# Patient Record
Sex: Female | Born: 1935 | Race: White | Hispanic: No | Smoking: Never smoker
Health system: Southern US, Community
[De-identification: ages and names within clinical notes are randomized; demographics above are authoritative.]

## PROBLEM LIST (undated history)

## (undated) DIAGNOSIS — M8580 Other specified disorders of bone density and structure, unspecified site: Secondary | ICD-10-CM

## (undated) DIAGNOSIS — D131 Benign neoplasm of stomach: Secondary | ICD-10-CM

## (undated) DIAGNOSIS — K219 Gastro-esophageal reflux disease without esophagitis: Secondary | ICD-10-CM

## (undated) DIAGNOSIS — K5792 Diverticulitis of intestine, part unspecified, without perforation or abscess without bleeding: Secondary | ICD-10-CM

## (undated) DIAGNOSIS — J309 Allergic rhinitis, unspecified: Secondary | ICD-10-CM

## (undated) DIAGNOSIS — F419 Anxiety disorder, unspecified: Secondary | ICD-10-CM

## (undated) DIAGNOSIS — K222 Esophageal obstruction: Secondary | ICD-10-CM

## (undated) DIAGNOSIS — G4700 Insomnia, unspecified: Secondary | ICD-10-CM

## (undated) DIAGNOSIS — K529 Noninfective gastroenteritis and colitis, unspecified: Secondary | ICD-10-CM

## (undated) DIAGNOSIS — K760 Fatty (change of) liver, not elsewhere classified: Secondary | ICD-10-CM

## (undated) DIAGNOSIS — K589 Irritable bowel syndrome without diarrhea: Secondary | ICD-10-CM

## (undated) DIAGNOSIS — H269 Unspecified cataract: Secondary | ICD-10-CM

## (undated) DIAGNOSIS — K5790 Diverticulosis of intestine, part unspecified, without perforation or abscess without bleeding: Secondary | ICD-10-CM

## (undated) DIAGNOSIS — G5600 Carpal tunnel syndrome, unspecified upper limb: Secondary | ICD-10-CM

## (undated) DIAGNOSIS — I4891 Unspecified atrial fibrillation: Secondary | ICD-10-CM

## (undated) DIAGNOSIS — K648 Other hemorrhoids: Secondary | ICD-10-CM

## (undated) DIAGNOSIS — I10 Essential (primary) hypertension: Secondary | ICD-10-CM

## (undated) DIAGNOSIS — E785 Hyperlipidemia, unspecified: Secondary | ICD-10-CM

## (undated) DIAGNOSIS — R519 Headache, unspecified: Secondary | ICD-10-CM

## (undated) DIAGNOSIS — C44311 Basal cell carcinoma of skin of nose: Secondary | ICD-10-CM

## (undated) DIAGNOSIS — R51 Headache: Principal | ICD-10-CM

## (undated) HISTORY — DX: Headache, unspecified: R51.9

## (undated) HISTORY — DX: Fatty (change of) liver, not elsewhere classified: K76.0

## (undated) HISTORY — DX: Unspecified atrial fibrillation: I48.91

## (undated) HISTORY — DX: Anxiety disorder, unspecified: F41.9

## (undated) HISTORY — PX: CHOLECYSTECTOMY: SHX55

## (undated) HISTORY — DX: Esophageal obstruction: K22.2

## (undated) HISTORY — PX: CATARACT EXTRACTION, BILATERAL: SHX1313

## (undated) HISTORY — DX: Irritable bowel syndrome, unspecified: K58.9

## (undated) HISTORY — DX: Carpal tunnel syndrome, unspecified upper limb: G56.00

## (undated) HISTORY — DX: Other hemorrhoids: K64.8

## (undated) HISTORY — DX: Benign neoplasm of stomach: D13.1

## (undated) HISTORY — DX: Insomnia, unspecified: G47.00

## (undated) HISTORY — DX: Basal cell carcinoma of skin of nose: C44.311

## (undated) HISTORY — DX: Unspecified cataract: H26.9

## (undated) HISTORY — DX: Noninfective gastroenteritis and colitis, unspecified: K52.9

## (undated) HISTORY — DX: Headache: R51

## (undated) HISTORY — DX: Allergic rhinitis, unspecified: J30.9

## (undated) HISTORY — DX: Other specified disorders of bone density and structure, unspecified site: M85.80

## (undated) HISTORY — DX: Hyperlipidemia, unspecified: E78.5

---

## 1998-06-28 ENCOUNTER — Other Ambulatory Visit: Admission: RE | Admit: 1998-06-28 | Discharge: 1998-06-28 | Payer: Self-pay | Admitting: *Deleted

## 1999-05-30 ENCOUNTER — Other Ambulatory Visit: Admission: RE | Admit: 1999-05-30 | Discharge: 1999-05-30 | Payer: Self-pay | Admitting: *Deleted

## 2000-06-07 ENCOUNTER — Other Ambulatory Visit: Admission: RE | Admit: 2000-06-07 | Discharge: 2000-06-07 | Payer: Self-pay | Admitting: *Deleted

## 2000-07-09 ENCOUNTER — Encounter: Payer: Self-pay | Admitting: Gastroenterology

## 2001-06-07 ENCOUNTER — Other Ambulatory Visit: Admission: RE | Admit: 2001-06-07 | Discharge: 2001-06-07 | Payer: Self-pay | Admitting: *Deleted

## 2001-07-17 ENCOUNTER — Encounter: Payer: Self-pay | Admitting: Emergency Medicine

## 2001-07-18 ENCOUNTER — Inpatient Hospital Stay (HOSPITAL_COMMUNITY): Admission: EM | Admit: 2001-07-18 | Discharge: 2001-07-22 | Payer: Self-pay | Admitting: Emergency Medicine

## 2001-07-18 ENCOUNTER — Encounter: Payer: Self-pay | Admitting: Emergency Medicine

## 2002-06-12 ENCOUNTER — Other Ambulatory Visit: Admission: RE | Admit: 2002-06-12 | Discharge: 2002-06-12 | Payer: Self-pay | Admitting: *Deleted

## 2002-07-01 LAB — HM DEXA SCAN

## 2002-07-18 ENCOUNTER — Ambulatory Visit (HOSPITAL_COMMUNITY): Admission: RE | Admit: 2002-07-18 | Discharge: 2002-07-18 | Payer: Self-pay | Admitting: Internal Medicine

## 2002-07-18 ENCOUNTER — Encounter: Payer: Self-pay | Admitting: Internal Medicine

## 2002-07-30 ENCOUNTER — Encounter: Payer: Self-pay | Admitting: Gastroenterology

## 2003-06-16 ENCOUNTER — Other Ambulatory Visit: Admission: RE | Admit: 2003-06-16 | Discharge: 2003-06-16 | Payer: Self-pay | Admitting: Obstetrics and Gynecology

## 2005-07-03 ENCOUNTER — Other Ambulatory Visit: Admission: RE | Admit: 2005-07-03 | Discharge: 2005-07-03 | Payer: Self-pay | Admitting: Obstetrics & Gynecology

## 2006-08-27 ENCOUNTER — Ambulatory Visit (HOSPITAL_COMMUNITY): Admission: RE | Admit: 2006-08-27 | Discharge: 2006-08-27 | Payer: Self-pay | Admitting: Internal Medicine

## 2007-02-22 DIAGNOSIS — S92909A Unspecified fracture of unspecified foot, initial encounter for closed fracture: Secondary | ICD-10-CM | POA: Insufficient documentation

## 2007-02-22 DIAGNOSIS — Z9089 Acquired absence of other organs: Secondary | ICD-10-CM | POA: Insufficient documentation

## 2007-02-22 DIAGNOSIS — C4490 Unspecified malignant neoplasm of skin, unspecified: Secondary | ICD-10-CM | POA: Insufficient documentation

## 2007-02-22 DIAGNOSIS — E785 Hyperlipidemia, unspecified: Secondary | ICD-10-CM | POA: Insufficient documentation

## 2007-02-22 DIAGNOSIS — K219 Gastro-esophageal reflux disease without esophagitis: Secondary | ICD-10-CM | POA: Insufficient documentation

## 2007-11-18 ENCOUNTER — Other Ambulatory Visit: Admission: RE | Admit: 2007-11-18 | Discharge: 2007-11-18 | Payer: Self-pay | Admitting: Obstetrics and Gynecology

## 2007-12-10 ENCOUNTER — Encounter: Payer: Self-pay | Admitting: Gastroenterology

## 2007-12-31 ENCOUNTER — Ambulatory Visit: Payer: Self-pay | Admitting: Gastroenterology

## 2007-12-31 DIAGNOSIS — R195 Other fecal abnormalities: Secondary | ICD-10-CM | POA: Insufficient documentation

## 2007-12-31 DIAGNOSIS — K589 Irritable bowel syndrome without diarrhea: Secondary | ICD-10-CM | POA: Insufficient documentation

## 2008-01-27 ENCOUNTER — Ambulatory Visit: Payer: Self-pay | Admitting: Gastroenterology

## 2008-06-24 ENCOUNTER — Observation Stay (HOSPITAL_COMMUNITY): Admission: AD | Admit: 2008-06-24 | Discharge: 2008-06-26 | Payer: Self-pay | Admitting: Internal Medicine

## 2008-06-25 ENCOUNTER — Ambulatory Visit: Payer: Self-pay | Admitting: Gastroenterology

## 2008-06-26 ENCOUNTER — Encounter (INDEPENDENT_AMBULATORY_CARE_PROVIDER_SITE_OTHER): Payer: Self-pay | Admitting: Cardiology

## 2008-07-09 ENCOUNTER — Encounter: Payer: Self-pay | Admitting: Cardiology

## 2009-06-18 ENCOUNTER — Ambulatory Visit: Payer: Self-pay | Admitting: Vascular Surgery

## 2009-07-12 ENCOUNTER — Emergency Department (HOSPITAL_COMMUNITY): Admission: EM | Admit: 2009-07-12 | Discharge: 2009-07-12 | Payer: Self-pay | Admitting: Emergency Medicine

## 2010-04-25 LAB — PHOSPHORUS: Phosphorus: 4 mg/dL (ref 2.3–4.6)

## 2010-04-25 LAB — DIFFERENTIAL
Basophils Absolute: 0 10*3/uL (ref 0.0–0.1)
Lymphocytes Relative: 28 % (ref 12–46)
Neutro Abs: 3.5 10*3/uL (ref 1.7–7.7)

## 2010-04-25 LAB — CARDIAC PANEL(CRET KIN+CKTOT+MB+TROPI)
CK, MB: 1.1 ng/mL (ref 0.3–4.0)
Total CK: 60 U/L (ref 7–177)
Troponin I: 0.01 ng/mL (ref 0.00–0.06)
Troponin I: 0.01 ng/mL (ref 0.00–0.06)
Troponin I: 0.02 ng/mL (ref 0.00–0.06)

## 2010-04-25 LAB — COMPREHENSIVE METABOLIC PANEL
BUN: 6 mg/dL (ref 6–23)
CO2: 24 mEq/L (ref 19–32)
Chloride: 102 mEq/L (ref 96–112)
Creatinine, Ser: 0.71 mg/dL (ref 0.4–1.2)
GFR calc non Af Amer: >60 mL/min (ref >60)
Glucose, Bld: 91 mg/dL (ref 70–99)
Total Bilirubin: 0.7 mg/dL (ref 0.3–1.2)

## 2010-04-25 LAB — CBC
HCT: 40.4 % (ref 36.0–46.0)
Hemoglobin: 13.9 g/dL (ref 12.0–15.0)
MCV: 99.1 fL (ref 78.0–100.0)
RBC: 4.08 MIL/uL (ref 3.87–5.11)
WBC: 5.6 10*3/uL (ref 4.0–10.5)

## 2010-04-25 LAB — D-DIMER, QUANTITATIVE: D-Dimer, Quant: <0.22 ug/mL-FEU (ref 0.00–0.48)

## 2010-05-31 NOTE — H&P (Signed)
[name], [name]            ACCOUNT NO.:  [id number]   MEDICAL RECORD NO.:  [id number]          PATIENT TYPE:  INP   LOCATION:  2038                         FACILITY:  [facility]   PHYSICIAN:  Mark A. Perini, M.D.   DATE OF BIRTH:  04-05-1932   DATE OF ADMISSION:  06/24/2008  DATE OF DISCHARGE:                              HISTORY & PHYSICAL   CHIEF COMPLAINT:  Right-sided chest pain.   HISTORY OF PRESENT ILLNESS:  Echo is a 77 year old Caucasian female  with a past medical history as listed below.  She reports chronic acid  reflux problems and chronic nausea problems.  However, in the last week  or so, she has had a pain in the right side of her chest.  She has pain  in the central area substernally and also pain the radiates around to  her right shoulder blade and around the entire right side of her chest.  For the last 2 days, this has been severe and essentially constant.  She  had been unable to sleep because of it.  The pain does come and go to  some degree.  It is not definitively related to exertion and is not  definitively related to eating in any manner.  She denies any burning,  tingling or itching feelings with this.  She has had no rash.  There is  not a pleuritic component to the pain.  She has tried to double her  reflux medicine with no improvement.  She took Tylenol with no  improvement in symptoms.  There has been no fever and no blood from  above or below.  She denies any significant shortness of breath.  Due to  her symptoms of chest pain., it is felt that she should be admitted for  further evaluation.   PAST HISTORY:  1. Urticaria since 1960.  2. Cholecystectomy in 1994.  3. Left foot fracture in 2000.  4. Postmenopausal since age 45.  5. Gastroesophageal reflux.  6. Neurodermatitis.  7. Hyperlipidemia and hypertriglyceridemia.  8. Diverticulosis.  9. Internal hemorrhoids.  10.Carpal tunnel syndrome diagnosed bilaterally, left greater than      right  in 2002.  11.Allergic rhinitis.  12.Admitted for diverticulitis in 2003.  13.Osteopenia.  14.Irritable bowel syndrome.  15.Anxiety.  16.She has many drug intolerances which is made it almost impossible      treat her cholesterol and she is intolerant of most medicines that      we attempt to give her.   ALLERGIES AND DRUG INTOLERANCES:  Include propylene glycol and any  medicine that has propylene glycol in it, penicillin, sulfa, Anaprox,  Levaquin, erythromycin, Ceclor, Tavist, Allegra, Sudafed, Kenalog,  Marcaine, Cipro, Nexium. Aspirin hurts her stomach.  She is intolerant  of Levsin, Zelnorm, niacin, Policosanol, Bentyl, and Lexapro.   CURRENT MEDICATIONS:  Caltrate, multivitamin, Bentyl, Prilosec twice  daily, fish oil six a day, Zetia daily, vitamin D 50,000 units weekly,  Fiorinal as needed, saline nasal spray, and over-the-counter Claritin as  needed.   SOCIAL HISTORY:  She is married.  Her husband, [name] is with her  currently.  She has  been married since 1957.  She is two daughters and  two grandchildren.  She had 1 year of college education.  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  No tobacco history.  No alcohol  or drugs.   FAMILY HISTORY:  Father died at age 61 of leukemia.  Mother died at age  27 of possible MI or stroke and she had diabetes.  One brother died of  coronary disease.  Other siblings have died of pancreatic cancer and  mesothelioma and she has one living sibling.  Her two children are in  good health.   REVIEW OF SYSTEMS:  As per the history of present illness.   PHYSICAL EXAMINATION:  Temperature 97.8, weight 132 pounds, blood  pressure 152/98, pulse 86, 98% saturation on room air.  She is in no acute distress.  There is no rash noted on her breast or right chest or her back to  suggest shingles.  LUNGS:  Clear to auscultation bilaterally with no wheezes, rales or  rhonchi.  HEART:  Regular rate and rhythm with no murmur, rub or  gallop.  There is  no tenderness.  There is no edema.  ABDOMEN:  Soft, nontender, and nondistended with no mass or  hepatosplenomegaly.   Laboratory data pending at this time.  An EKG done in the office shows  normal sinus rhythm with an incomplete right bundle branch block.  She  does have a PVC.  There is a leftward axis.  There are normal intervals.  There is some artifact which makes is somewhat difficult to interpret,  however, there are no definite ST or T-wave changes and there is no  significant change compared to an EKG done in December 2009 and there is  no hypertrophy.   ASSESSMENT/PLAN:  A 77 year old female with right-sided chest pain.  We  will admit.  We will check a G D-dimer, as well as cardiac enzymes and a  BNP. We will continue her acid reflux treatment.  We will use  hydrocodone or Tylenol for pain management.  We will place her on Plavix  as she does not tolerate aspirin and we will put her on DVT prophylactic  dose Lovenox.  Depending on the results of her D-dimer and other lab  tests, we may consider a CT scan of the chest or possibly even a CT  angiogram of the chest and we will also consider a cardiology  consultation.  Her risk factors for underlying coronary disease include  some family history, age, and essentially untreated cholesterol problems  but fortunately she has not had other significant cardiac risk factors  to this date.      Mark A. Perini, M.D.  Electronically Signed     MAP/MEDQ  D:  06/24/2008  T:  06/24/2008  Job:  [id number]

## 2010-05-31 NOTE — Procedures (Signed)
CAROTID DUPLEX EXAM   INDICATION:  Plaque in retinas.   HISTORY:  Diabetes:  No.  Cardiac:  No.  Hypertension:  No.  Smoking:  No.  Previous Surgery:  No.  CV History:  Asymptomatic.  Amaurosis Fugax No, Paresthesias No, Hemiparesis No                                       RIGHT             LEFT  Brachial systolic pressure:         120               128  Brachial Doppler waveforms:         Normal            Normal  Vertebral direction of flow:        Antegrade         Antegrade  DUPLEX VELOCITIES (cm/sec)  CCA peak systolic                   74                75  ECA peak systolic                   59                75  ICA peak systolic                   74                61  ICA end diastolic                   22                24  PLAQUE MORPHOLOGY:                  Soft              Soft  PLAQUE AMOUNT:                      Minimal           Minimal  PLAQUE LOCATION:                    ICA               ICA   IMPRESSION:  1. Doppler velocities suggest a 1%-39% stenosis in the bilateral      internal carotid arteries.  2. Antegrade flow in the vertebral arteries.       ___________________________________________  Larina Earthly, M.D.   NT/MEDQ  D:  06/18/2009  T:  06/18/2009  Job:  [id number]   cc:   Loraine Leriche A. Perini, M.D.

## 2010-05-31 NOTE — Consult Note (Signed)
[name] [name]            ACCOUNT NO.:  [id number]   MEDICAL RECORD NO.:  [id number]          PATIENT TYPE:  INP   LOCATION:  2038                         FACILITY:  [facility]   PHYSICIAN:  Georga Hacking, M.D.DATE OF BIRTH:  26-May-1932   DATE OF CONSULTATION:  06/25/2008  DATE OF DISCHARGE:                                 CONSULTATION   REASON FOR CONSULTATION:  Thank you for asking me to see this very nice  77 year old female for evaluation of chest pain.  She comes from a  family with a strong history of ischemic heart disease, and I have seen  her several times in the year for stress testing and at one point she  even had a catheterization, although I have not seen her in over 10  years, so do not immediately have that report.  She did not have much  coronary artery disease noted previously.  She has chronic GI problems  with nausea and chronic reflux.  She has had about a five-week history  of chest discomfort.  She describes central chest pain, suggestive of  reflux, but in the past week developed right-sided chest pain that would  radiate through to her back and  around to the entire right side of her  chest, which then became constant and severe, and was so bad that she  could not sleep.  It was not related to exertion.  She went to the  [employer] exercise class up until one week ago, when she stopped because of  the pain.  She has not had any recent rash or pleuritic component to her  pain.  She has not had fever or other symptoms.  The pain does not  radiate to the left arm, and is described as a sharp and lancinating-  type pain.  It has been better since she has been in the hospital.   PAST MEDICAL HISTORY:  1. Urticaria since 1960.  2. She has a history of neurodermatitis.  3. Hyperlipidemia.  4. Hypertriglyceridemia.  5. Intolerance to number of statin drugs.  6. A previous admission for diverticulosis.  7. Hemorrhoids.  8. Osteopenia.  9. Irritable bowel  syndrome.  10.Anxiety.  11.She also has known carpal tunnel syndrome.   PAST SURGICAL HISTORY:  Cholecystectomy and foot fracture.   ALLERGIES/INTOLERANCES:  She is intolerant of LEVSIN, ZELNORM, NIACIN,  POLICOSANOL, BENTYL, LEXAPRO.  She claims allergies to any medicine  including PROPYLENE GLYCOL, PENICILLIN, SULFA, ANAPROX,  LEVAQUIN,  ERYTHROMYCIN, CECLOR, TAVIST, ALLEGRA, SUDAFED, KENALOG, MARCAINE,  CIPRO, NEXIUM, and says that ASPIRIN hurts her stomach.   CURRENT MEDICATIONS:  1. Caltrate.  2. Bentyl.  3. Prilosec 20 mg b.i.d.  4. Fish oil twice daily.  5. Zetia 10 mg daily.  6. Vitamin D 50,000 units weekly.  7. Fiorinal as needed.   SOCIAL HISTORY:  Is married for 53 years.  She has two daughters and two  grandchildren.  She is a Futures trader.  She is a nonsmoker.  Does not use  alcohol to excess.   FAMILY HISTORY:  Father died at age 109, of leukemia.  Mother died at age  85, of a stroke.  A brother died of coronary artery disease.  Another  brother had coronary artery disease, but died of pancreatic cancer.  Another brother mesothelioma, but also had a history of coronary artery  disease.   REVIEW OF SYSTEMS:  Her weight has been stable.  She claims significant  anxiety.  She has no eye, ear, nose or throat problems.  She does not  have any hearing problems.  She complains of occasional mild dysphasia.  She has irritable bowel syndrome and has occasional diarrhea and  constipation.  She has some urinary frequency.  She complains of  arthritis involving her knee.  She has not had syncope but has frequent  headaches.  She has no history of stroke or TIA.  Other than as noted  above, the remainder of the review of systems is unremarkable.   PHYSICAL EXAMINATION:  GENERAL:  She is a pleasant elderly female,  appearing her stated age.  VITAL SIGNS:  Blood pressure is 117/66, pulse is 78 and regular, oxygen  saturations were normal.  SKIN:  Skin is warm and dry.  HEENT:   EOMI.  Pupils equal, round, reactive to light and accommodation.  Cornea and sclerae clear. Fundi not examined.  Pharynx negative.  NECK:  Supple without masses, JVD, thyromegaly or bruits.  LUNGS:  Clear to auscultation and percussion.  CARDIOVASCULAR:  Exam normal S1 and S2.  No S3, S4 or murmur.  ABDOMEN:  Abdomen is soft and nontender.  Femoral distal pulses are 2+.  No edema.  No varicosities noted.  NEUROLOGIC:  Normal.   DATA:  A 12-lead EKG is normal.   Previous abdominal ultrasound shows a previous cholecystectomy.  Chest x-  ray is normal.   LABORATORY DATA:  Shows a normal CBC, D-dimer, chemistry panel.  Three  sets of cardiac enzymes were normal.  A beta natriuretic peptide is 33.   EKG is normal x2.   IMPRESSION:  1. Constant chest pain, predominantly right-sided, which is atypical      for myocardial ischemia.  The pain really does not sound like      cardiac-type pain or even pulmonary pain.  The pain would be more      suggestive of a neuropathic musculoskeletal etiology.  The things      that come to mind would be shingles, some sort of a disk in the      back or collapsed vertebra, musculoskeletal pain or possibly      radiated pain from the esophagus  2. Multiple drug allergies.  3. A recent esophageal stricture noted.  4. Chronic headaches.  5. Neurodermatitis  6. History of diverticulosis.  7. History of carpal tunnel syndrome.  8. Hyperlipidemia, with intolerance to several statin drugs.   RECOMMENDATIONS:  Her pain really does not sound cardiac to me, and she  did not particularly think it was either.  I would suggest getting an  echocardiogram and pursuing the chest CT and other workup.  I would  wonder about shingles and would also suggest a thoracic spine film or  possibly even an MRI or CT scan of her back area if the pain persists.  If no other etiology can be found for the pain, despite treatment, we  could  consider a Stress Test Center, although  the pain is really  atypical for myocardial ischemia.   Thank you for asking me to see her with you.      Georga Hacking, M.D.  Electronically  Signed     WST/MEDQ  D:  06/25/2008  T:  06/25/2008  Job:  [id number]

## 2010-06-03 NOTE — Discharge Summary (Signed)
[place]  Patient:    [name], [name] Visit Number: [id number] MRN: [id number]          Service Type: MED Location: 5000 [id number] Attending Physician:  Cherylynn Ridges Dictated by:   Jimmye Norman, M.D. Admit Date:  07/17/2001 Discharge Date: 07/22/2001                             Discharge Summary  DISCHARGE DIAGNOSIS:  Acute diverticulitis.  PRINCIPAL PROCEDURE:  Admission and IV antibiotics, IV hydration, and pain control.  DISCHARGE MEDICATIONS: 1. She was discharged to home with ciprofloxacin 500 mg p.o. b.i.d. 2. Flagyl one tablet p.o. t.i.d.  DIET:  As tolerated.  CONDITION:  Stable.  DISCHARGE INSTRUCTIONS: 1. She was to follow up with me on July 30, 2001. 2. She was to be advanced to a high fiber, low residue diet, soft diet.  BRIEF SUMMARY OF HOSPITAL COURSE:  The patient was admitted by me with abdominal pain, mildly elevated white count with a left shift, and abdominal pain localizing mostly to the left lower quadrant with rebound and guarding. She had a CT scan which demonstrated some pericolonic inflammation with the possibility of microperforation.  The patient never progressed in her symptoms and her disease.  She was treated with IV antibiotics including Primaxin and p.o. proton pump inhibitor, and Protonix.  She progressed well after several days in the hospital and she improved to where she was tolerating a regular diet and had minimal pain and was discharged to home.  She was to follow up with me on July 15. Dictated by:   Jimmye Norman, M.D. Attending Physician:  Cherylynn Ridges DD:  08/01/01 TD:  08/05/01 Job: [id number]

## 2010-06-03 NOTE — Discharge Summary (Signed)
[name], [name]            ACCOUNT NO.:  [id number]   MEDICAL RECORD NO.:  [id number]          PATIENT TYPE:  OBV   LOCATION:  2038                         FACILITY:  [facility]   PHYSICIAN:  Mark A. Perini, M.D.   DATE OF BIRTH:  06-Feb-1932   DATE OF ADMISSION:  06/24/2008  DATE OF DISCHARGE:  06/26/2008                               DISCHARGE SUMMARY   DISCHARGE DIAGNOSES:  1. Right-sided chest pain felt to be noncardiac in nature.  2. Significant acid reflux.  3. Stricture noted at the gastroesophageal junction.  4. Hyperlipidemia.  5. Neurodermatitis.  6. Allergic rhinitis.  7. Multiple drug intolerances.  8. Anxiety and irritable bowel syndrome.  9. Mildly reduced systolic function noted on echocardiogram this      admission.   PROCEDURES:  1. Gastroenterology consultation.  2. Cardiology consultation.   PROCEDURES:  1. Upper endoscopy performed on June 25, 2008, which showed stricture      at the gastroesophageal junction, otherwise normal examination.  2. 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  No acute abdominal process.  Small amount of      gas within the esophagus could represent dysmotility.  3. Lumbar spine x-ray on June 25, 2008 showed normal alignment with no      acute bony findings.  4. Thoracic x-ray on June 25, 2008, showed normal alignment with no      acute bony findings.  5. Chest x-ray on June 24, 2008, showed no active disease.   DISCHARGE MEDICATIONS:  1. She is to resume Caltrate and multivitamin as before.  2. She is to resume her fish oil as before.  3. She is to use Bentyl as needed.  4. She may take Zetia 10 mg daily.  5. Vitamin D 50,000 units weekly.  6. Fiorinal as needed  for headache.  7. Saline nasal spray as needed.  8. Over-the-counter Claritin 10 mg daily as needed.   She is to discontinue Prilosec and start Zegerid 40 mg twice daily and  she may take Tums for breakthrough pain.   HISTORY OF PRESENT ILLNESS:  [name] is a pleasant 77 year old female who  presented to the office with 1 week pain in the right side of her chest.  The pain was central and substernal, but also radiated around to her  right shoulder blade and around the entire right side of her chest.  For  the last 2 days, it had been severe and essentially constant.  She was  unable to sleep.  The pain did come and go to some degree, but was  mostly persistent.  The pain did not relate to exertion and did not  relate eating.  She denied any neuropathic symptoms such as burning  tingling or itching.  She denied any rash  that will be consistent with  shingles.  There was no pleuritic component.  She did try to double her  reflux medicine with no improvement.  She took Tylenol with no  improvement.  There was no fever noted.  Due to these symptoms, she was  admitted for further evaluation.   HOSPITAL COURSE:  [name] was admitted to a telemetry bed.  She remained  stable from a cardiovascular and pulmonary standpoint during her stay.  Cardiology and gastroenterology were consulted.  She did also undergo an  echocardiogram performed on June 26, 2008.  This showed systolic  function mildly reduced with an ejection fraction of 45-50% with normal  wall motion with no regional wall motion abnormalities.  She had mild  mitral regurgitation.  She underwent EGD and her antireflux medicine was  changed.  She never developed a shingles rash.  There was no clear  evidence if this was musculoskeletal pain or pain radiating from a  spinal degenerative process.  On June 26, 2008, her workup was complete  and she was felt stable for discharge home.   DISCHARGE PHYSICAL EXAMINATION:  VITAL SIGNS:   Temperature 97.9,  afebrile; pulse 87; respiratory rate 18; 95% saturation on room air; and  blood pressure 113/67.  GENERAL:  She is in no acute distress.  LUNGS:  Clear to auscultation bilaterally.  HEART:  Regular rate and rhythm with no murmur.  ABDOMEN:  Soft and nontender.  There was no rash.   DISCHARGE LABORATORY DATA:  Several sets of cardiac enzymes were normal;  and admission CMP and CBC were unremarkable as well as a D-dimer, which  was normal on June 24, 2008; and a BNP, which was 4 on June 24, 2008.   DISCHARGE INSTRUCTIONS:  [name] is to follow a heart-healthy diet.  She  is to increase her activity slowly.  She is to call if she has any  recurrent problems.  She is to follow up with Dr. Waynard Edwards in 3-4 weeks at  a previously scheduled visit and she is to call Dr. Donnie Aho for a follow  up in the office and she may undergo a follow up stress test.      Loraine Leriche A. Perini, M.D.  Electronically Signed     MAP/MEDQ  D:  07/15/2008  T:  07/16/2008  Job:  [id number]   cc:   Georga Hacking, M.D.

## 2010-06-03 NOTE — H&P (Signed)
[place]. [facility]  Patient:    [name], [name] Visit Number: [id number] MRN: [id number]          Service Type: MED Location: 5000 [id number] Attending Physician:  Cherylynn Ridges Dictated by:   Jimmye Norman, M.D. Admit Date:  07/17/2001 Discharge Date: 07/22/2001   CC:         Jeannett Senior A. Evlyn Kanner, M.D.   History and Physical  CHIEF COMPLAINT:  The patient is a 77 year old woman admitted with lower abdominal pain and probably acute diverticulitis.  HISTORY OF PRESENT ILLNESS:  The patient has been in our emergency room since approximately 7 oclock this evening.  She developed severe abdominal pain approximately two to three hours earlier, after eating a meal, which had not abated with ambulation.  She has had a history of recent constipation and did take a suppository a couple of days ago along with Dulcolax pills several days ago with some diarrhea on Tuesday but had significant pain yesterday.  She came to the emergency room and her pain did not abate.  She had no previous history of diverticulitis.  PAST MEDICAL HISTORY:  Significant for sinusitis, sinus disease, gastric reflux, and acid reflux.  PAST SURGICAL HISTORY:  The only surgery she has had before was a laparoscopic cholecystectomy.  She has not had a hysterectomy.  MEDICATIONS:  Aciphex and Caltrate.  She also was taking a hormonal supplement up to a week ago.  She cannot remember the name.  ALLERGIES:  The patient has sensitivity to a number of medications, but the one she can document are PENICILLIN, SINEQUAN, ANAPROX.  She has GI intolerance to ERYTHROMYCIN.  She also does not tolerate SUDAFED and reports a possible allergy to LOCAL ANESTHETIC.  She also has POLYSPORIN sensitivity.  REVIEW OF SYSTEMS:  She had not had a bowel movement for approximately a week up until Tuesday.  PHYSICAL EXAMINATION:  VITAL SIGNS:  She currently has a temperature of 99.4.  Her other vital  signs are stable.  HEENT:  She is normocephalic, atraumatic, and anicteric.  NECK:  Supple.  CHEST:  Clear.  HEART:  Regular rate and rhythm.  No murmurs, rubs, gallops, or heaves.  She occasionally has been tachycardic.  ABDOMEN:  Distended, particularly in the lower abdomen with hypoactive bowel sounds.  She has localized rebound and guarding in the lower abdomen.  RECTAL:  Unremarkable.  LABORATORY DATA:  White count of 10.3 thousand with a left shift.  Her BMET is normal.  X-rays demonstrate diverticulitis in the mid to distal sigmoid colon with some free fluid in the pelvis, possibly secondary to reaction to the inflammation and possibly representing perforation.  PLAN:  The plan is to admit her and place her on IV antibiotics and Primaxin primarily.  Pain control and IV hydration.  We will reassess her within 24 hours, and if her pain is worsened or not showing significant improvement, then she may be a candidate for surgical aspiration. Dictated by:   Jimmye Norman, M.D. Attending Physician:  Cherylynn Ridges DD:  07/18/01 TD:  07/21/01 Job: [id number]

## 2010-07-14 IMAGING — CR DG CHEST 1V PORT
1 series · 1 of 1 positions shown · non-contrast
Comparison: None

CLINICAL DATA: Right-sided chest pain.

PORTABLE CHEST - 1 VIEW

[AP]
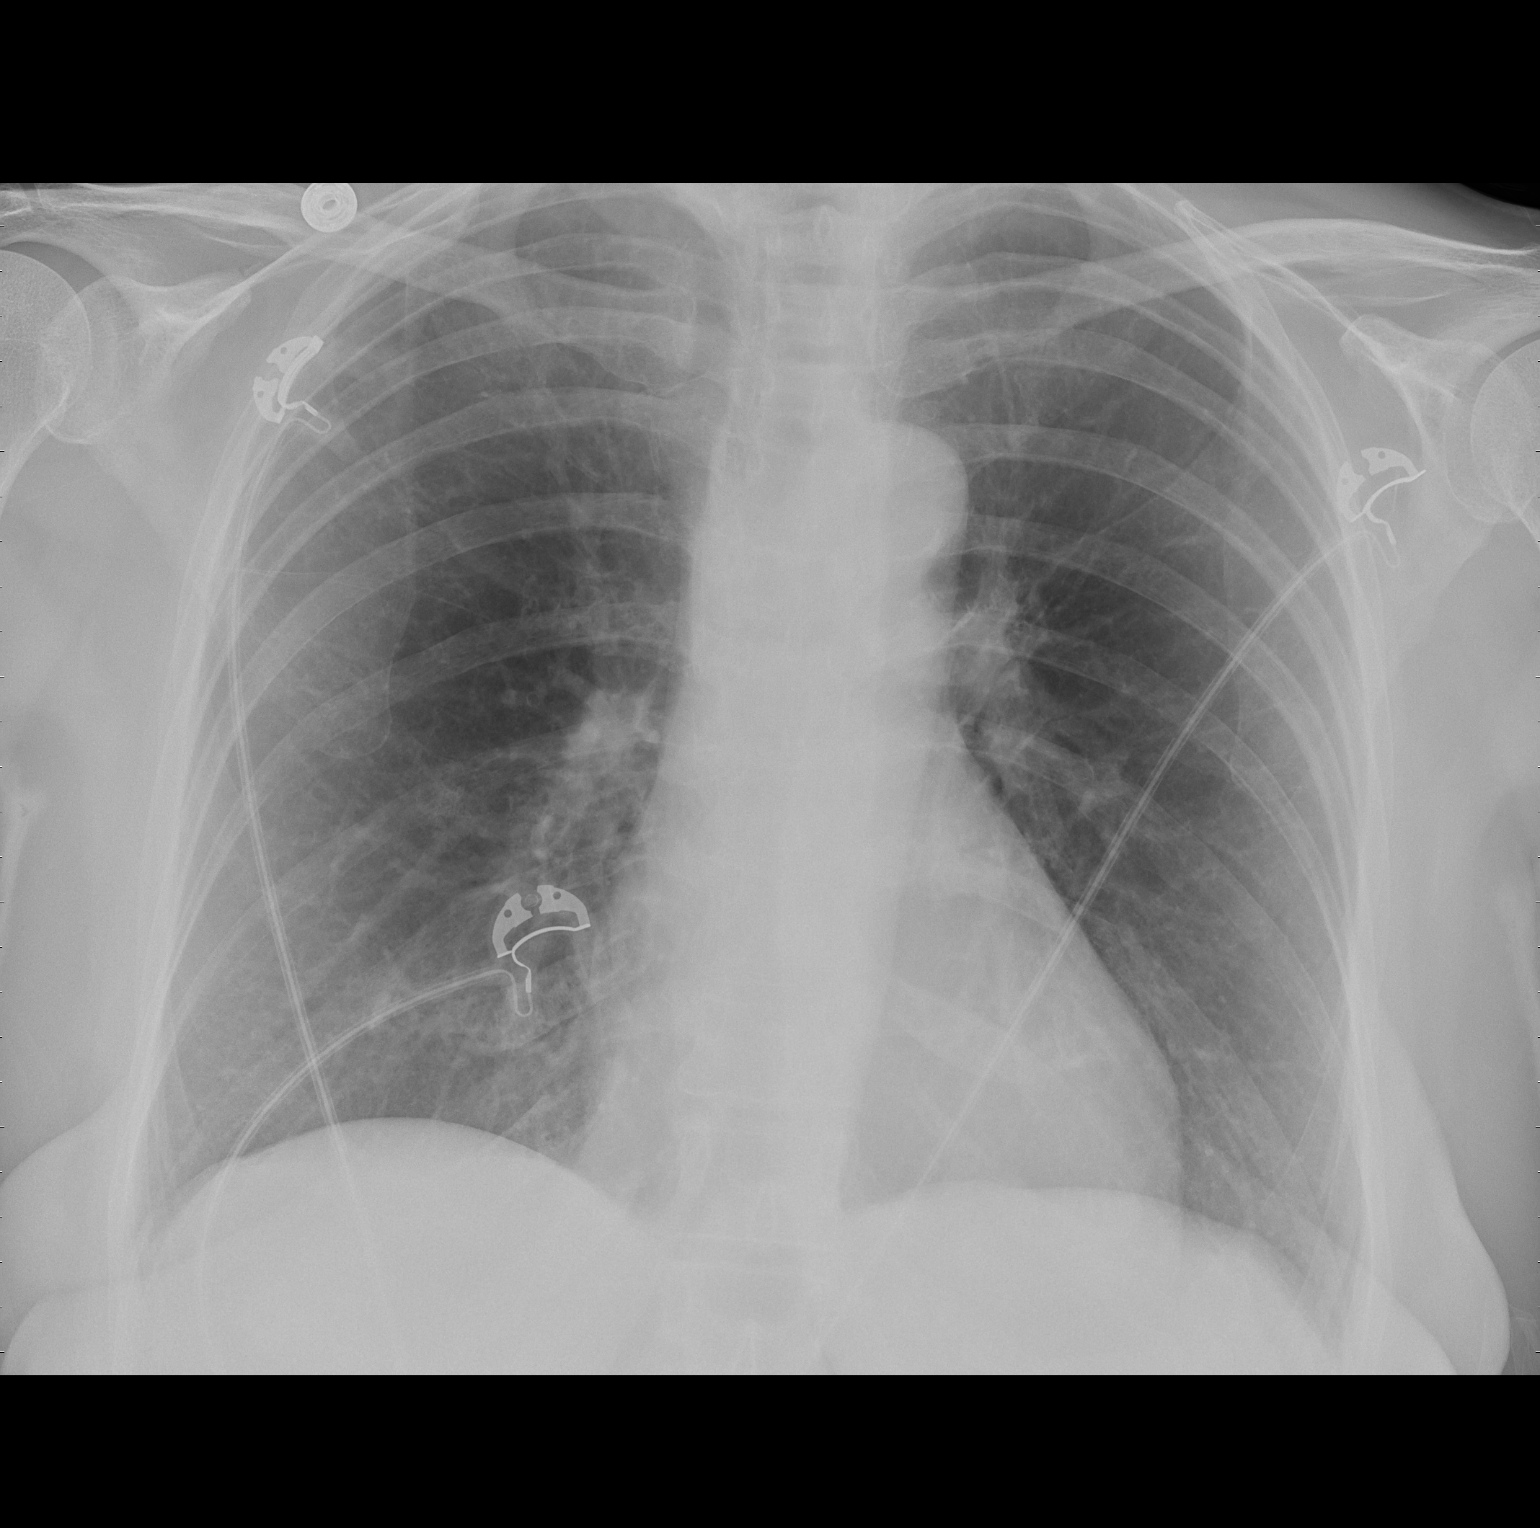

[1 of 1 positions shown; findings below may reference images not displayed]

FINDINGS: Heart and mediastinal contours are within normal limits.
No focal opacities or effusions.  No acute bony abnormality.
IMPRESSION: No active disease.

## 2010-07-15 IMAGING — CR DG THORACIC SPINE 2V
3 series · 3 of 3 positions shown · non-contrast
Comparison: None

CLINICAL DATA: Pain.

THORACIC SPINE - 2 VIEW

[t t-spine a.p.]
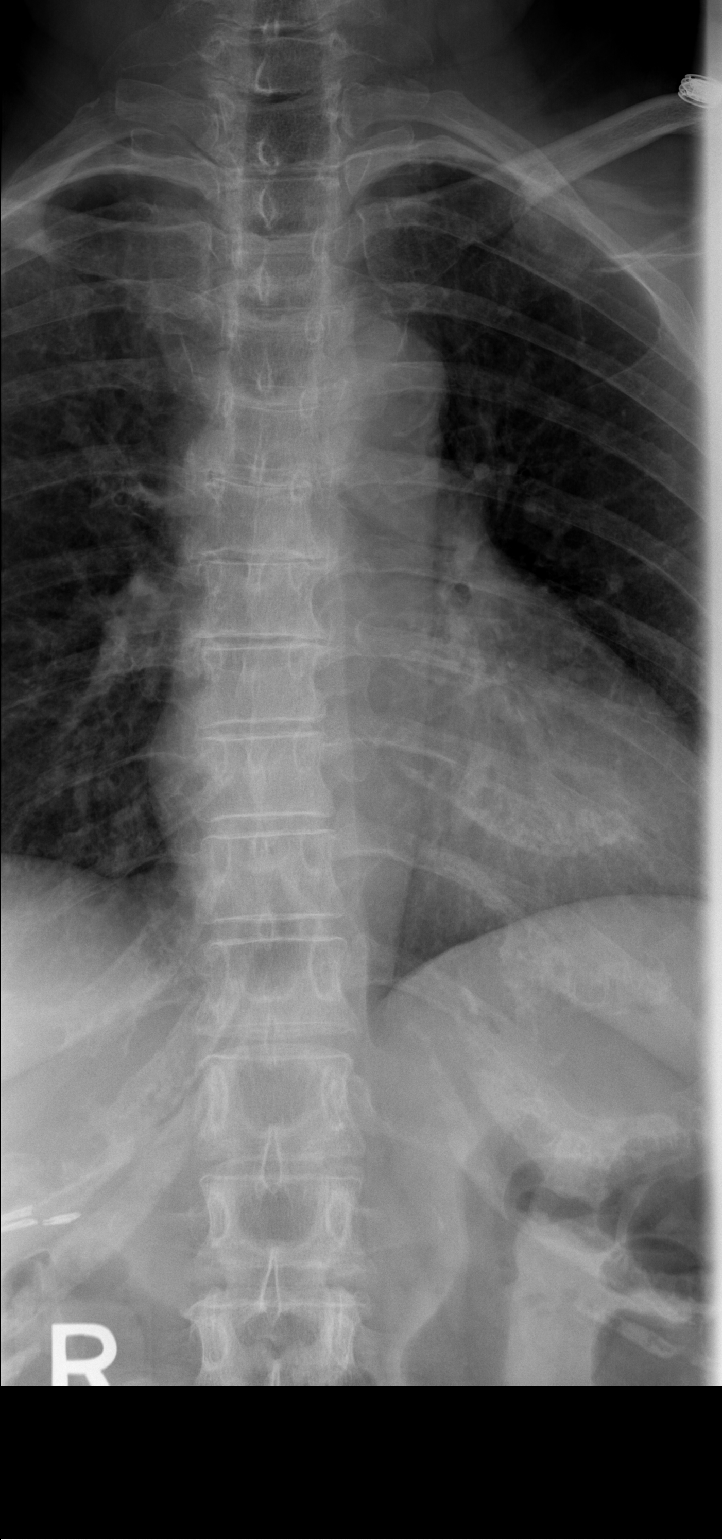

[t t-spine lat *]
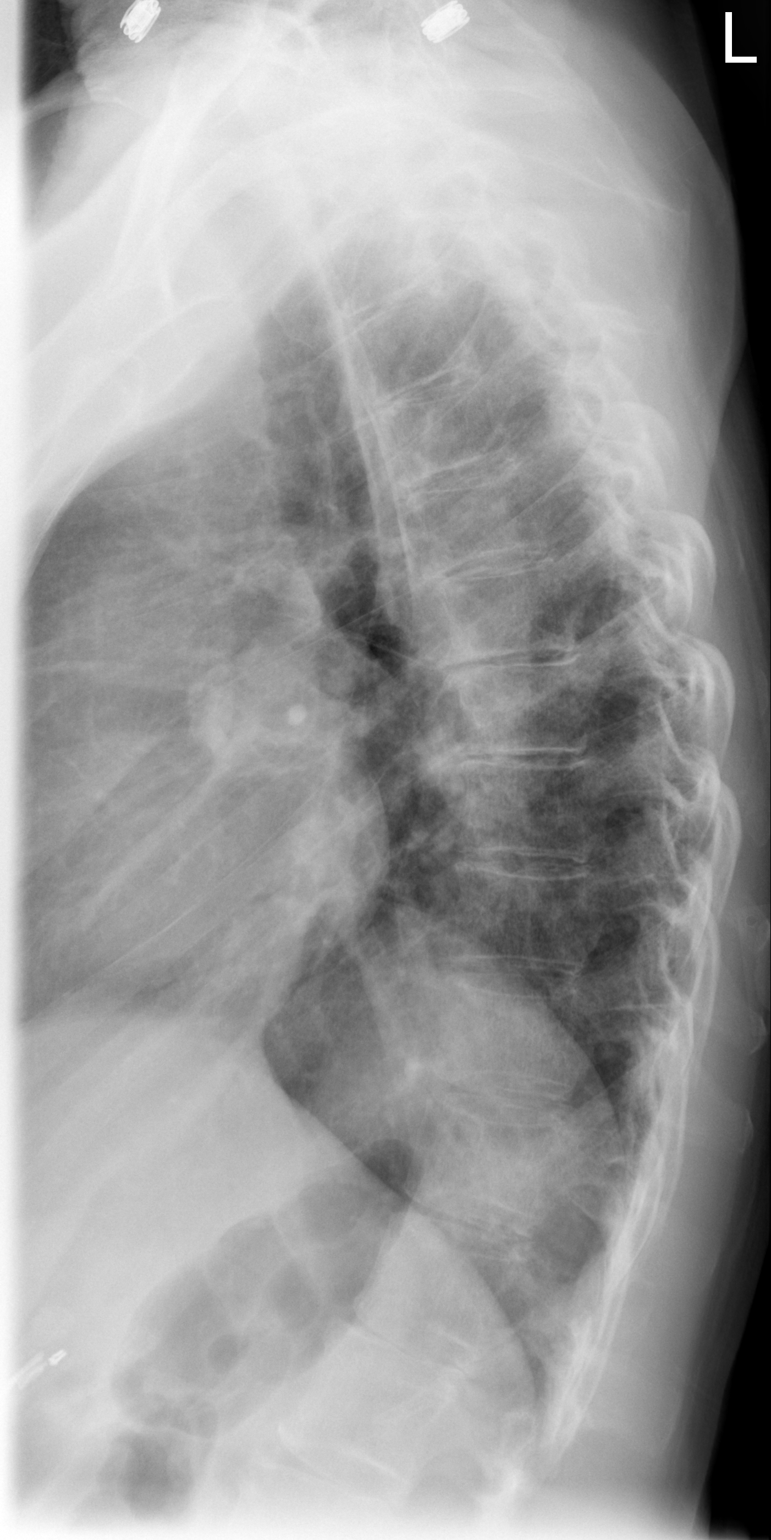

[t swimmers *]
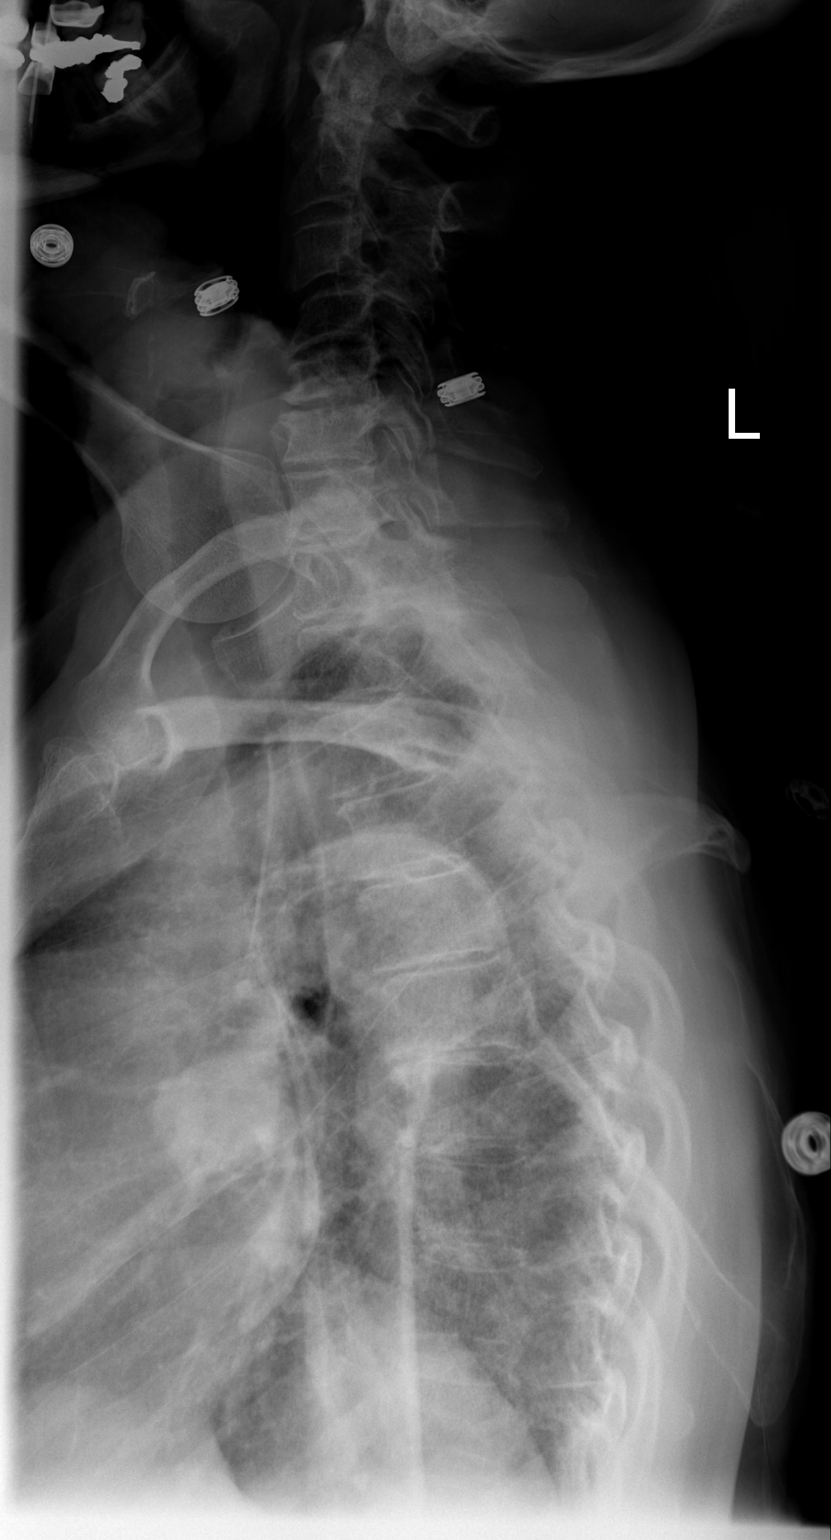

[3 of 3 positions shown; findings below may reference images not displayed]

FINDINGS: The lateral film demonstrates normal alignment of the
thoracic vertebral bodies.  No acute bony findings or abnormal
paraspinal soft tissue swelling.  The visualized posterior ribs are
intact.
IMPRESSION: Normal alignment and no acute bony findings.

## 2011-02-07 DIAGNOSIS — M9981 Other biomechanical lesions of cervical region: Secondary | ICD-10-CM | POA: Diagnosis not present

## 2011-02-07 DIAGNOSIS — Q7649 Other congenital malformations of spine, not associated with scoliosis: Secondary | ICD-10-CM | POA: Diagnosis not present

## 2011-02-09 DIAGNOSIS — Q7649 Other congenital malformations of spine, not associated with scoliosis: Secondary | ICD-10-CM | POA: Diagnosis not present

## 2011-02-09 DIAGNOSIS — M9981 Other biomechanical lesions of cervical region: Secondary | ICD-10-CM | POA: Diagnosis not present

## 2011-02-13 DIAGNOSIS — M9981 Other biomechanical lesions of cervical region: Secondary | ICD-10-CM | POA: Diagnosis not present

## 2011-02-13 DIAGNOSIS — Q7649 Other congenital malformations of spine, not associated with scoliosis: Secondary | ICD-10-CM | POA: Diagnosis not present

## 2011-02-15 DIAGNOSIS — M9981 Other biomechanical lesions of cervical region: Secondary | ICD-10-CM | POA: Diagnosis not present

## 2011-02-15 DIAGNOSIS — Q7649 Other congenital malformations of spine, not associated with scoliosis: Secondary | ICD-10-CM | POA: Diagnosis not present

## 2011-02-16 ENCOUNTER — Other Ambulatory Visit: Payer: Self-pay | Admitting: Internal Medicine

## 2011-02-16 DIAGNOSIS — M542 Cervicalgia: Secondary | ICD-10-CM

## 2011-02-18 ENCOUNTER — Ambulatory Visit
Admission: RE | Admit: 2011-02-18 | Discharge: 2011-02-18 | Disposition: A | Discharge status: Home / Self Care | Payer: Medicare Other | Source: Ambulatory Visit | Attending: Internal Medicine | Admitting: Internal Medicine

## 2011-02-18 DIAGNOSIS — M4310 Spondylolisthesis, site unspecified: Secondary | ICD-10-CM | POA: Diagnosis not present

## 2011-02-18 DIAGNOSIS — M47812 Spondylosis without myelopathy or radiculopathy, cervical region: Secondary | ICD-10-CM | POA: Diagnosis not present

## 2011-02-18 DIAGNOSIS — R51 Headache: Secondary | ICD-10-CM | POA: Diagnosis not present

## 2011-02-18 DIAGNOSIS — M542 Cervicalgia: Secondary | ICD-10-CM

## 2011-02-22 DIAGNOSIS — Q7649 Other congenital malformations of spine, not associated with scoliosis: Secondary | ICD-10-CM | POA: Diagnosis not present

## 2011-02-22 DIAGNOSIS — M9981 Other biomechanical lesions of cervical region: Secondary | ICD-10-CM | POA: Diagnosis not present

## 2011-02-24 DIAGNOSIS — M9981 Other biomechanical lesions of cervical region: Secondary | ICD-10-CM | POA: Diagnosis not present

## 2011-02-24 DIAGNOSIS — Q7649 Other congenital malformations of spine, not associated with scoliosis: Secondary | ICD-10-CM | POA: Diagnosis not present

## 2011-02-28 DIAGNOSIS — M9981 Other biomechanical lesions of cervical region: Secondary | ICD-10-CM | POA: Diagnosis not present

## 2011-02-28 DIAGNOSIS — Q7649 Other congenital malformations of spine, not associated with scoliosis: Secondary | ICD-10-CM | POA: Diagnosis not present

## 2011-03-03 ENCOUNTER — Other Ambulatory Visit: Payer: Self-pay | Admitting: Dermatology

## 2011-03-03 DIAGNOSIS — D239 Other benign neoplasm of skin, unspecified: Secondary | ICD-10-CM | POA: Diagnosis not present

## 2011-03-03 DIAGNOSIS — C44319 Basal cell carcinoma of skin of other parts of face: Secondary | ICD-10-CM | POA: Diagnosis not present

## 2011-03-03 DIAGNOSIS — L570 Actinic keratosis: Secondary | ICD-10-CM | POA: Diagnosis not present

## 2011-03-09 DIAGNOSIS — M542 Cervicalgia: Secondary | ICD-10-CM | POA: Diagnosis not present

## 2011-03-22 DIAGNOSIS — M542 Cervicalgia: Secondary | ICD-10-CM | POA: Diagnosis not present

## 2011-03-23 DIAGNOSIS — C44319 Basal cell carcinoma of skin of other parts of face: Secondary | ICD-10-CM | POA: Diagnosis not present

## 2011-04-21 DIAGNOSIS — E785 Hyperlipidemia, unspecified: Secondary | ICD-10-CM | POA: Diagnosis not present

## 2011-04-21 DIAGNOSIS — E559 Vitamin D deficiency, unspecified: Secondary | ICD-10-CM | POA: Diagnosis not present

## 2011-04-21 DIAGNOSIS — R82998 Other abnormal findings in urine: Secondary | ICD-10-CM | POA: Diagnosis not present

## 2011-04-21 DIAGNOSIS — M899 Disorder of bone, unspecified: Secondary | ICD-10-CM | POA: Diagnosis not present

## 2011-04-21 DIAGNOSIS — M949 Disorder of cartilage, unspecified: Secondary | ICD-10-CM | POA: Diagnosis not present

## 2011-04-28 DIAGNOSIS — R5381 Other malaise: Secondary | ICD-10-CM | POA: Diagnosis not present

## 2011-04-28 DIAGNOSIS — M542 Cervicalgia: Secondary | ICD-10-CM | POA: Diagnosis not present

## 2011-04-28 DIAGNOSIS — Z0000 Encounter for general adult medical examination without abnormal findings: Secondary | ICD-10-CM | POA: Diagnosis not present

## 2011-04-28 DIAGNOSIS — R3129 Other microscopic hematuria: Secondary | ICD-10-CM | POA: Diagnosis not present

## 2011-04-28 DIAGNOSIS — E785 Hyperlipidemia, unspecified: Secondary | ICD-10-CM | POA: Diagnosis not present

## 2011-05-19 DIAGNOSIS — R3129 Other microscopic hematuria: Secondary | ICD-10-CM | POA: Diagnosis not present

## 2011-05-19 DIAGNOSIS — R82998 Other abnormal findings in urine: Secondary | ICD-10-CM | POA: Diagnosis not present

## 2011-06-01 DIAGNOSIS — N952 Postmenopausal atrophic vaginitis: Secondary | ICD-10-CM | POA: Diagnosis not present

## 2011-07-11 DIAGNOSIS — H52209 Unspecified astigmatism, unspecified eye: Secondary | ICD-10-CM | POA: Diagnosis not present

## 2011-07-11 DIAGNOSIS — H2510 Age-related nuclear cataract, unspecified eye: Secondary | ICD-10-CM | POA: Diagnosis not present

## 2011-07-11 DIAGNOSIS — H04229 Epiphora due to insufficient drainage, unspecified lacrimal gland: Secondary | ICD-10-CM | POA: Diagnosis not present

## 2011-07-19 DIAGNOSIS — R82998 Other abnormal findings in urine: Secondary | ICD-10-CM | POA: Diagnosis not present

## 2011-07-19 DIAGNOSIS — N390 Urinary tract infection, site not specified: Secondary | ICD-10-CM | POA: Diagnosis not present

## 2011-07-25 DIAGNOSIS — R350 Frequency of micturition: Secondary | ICD-10-CM | POA: Diagnosis not present

## 2011-07-25 DIAGNOSIS — R3129 Other microscopic hematuria: Secondary | ICD-10-CM | POA: Diagnosis not present

## 2011-07-27 DIAGNOSIS — L570 Actinic keratosis: Secondary | ICD-10-CM | POA: Diagnosis not present

## 2011-07-31 DIAGNOSIS — R3129 Other microscopic hematuria: Secondary | ICD-10-CM | POA: Diagnosis not present

## 2011-07-31 DIAGNOSIS — K5730 Diverticulosis of large intestine without perforation or abscess without bleeding: Secondary | ICD-10-CM | POA: Diagnosis not present

## 2011-07-31 DIAGNOSIS — N9489 Other specified conditions associated with female genital organs and menstrual cycle: Secondary | ICD-10-CM | POA: Diagnosis not present

## 2011-08-14 DIAGNOSIS — N905 Atrophy of vulva: Secondary | ICD-10-CM | POA: Diagnosis not present

## 2011-09-05 DIAGNOSIS — L570 Actinic keratosis: Secondary | ICD-10-CM | POA: Diagnosis not present

## 2011-11-20 DIAGNOSIS — Z1231 Encounter for screening mammogram for malignant neoplasm of breast: Secondary | ICD-10-CM | POA: Diagnosis not present

## 2011-12-04 DIAGNOSIS — N952 Postmenopausal atrophic vaginitis: Secondary | ICD-10-CM | POA: Diagnosis not present

## 2011-12-04 DIAGNOSIS — Z124 Encounter for screening for malignant neoplasm of cervix: Secondary | ICD-10-CM | POA: Diagnosis not present

## 2011-12-04 DIAGNOSIS — L293 Anogenital pruritus, unspecified: Secondary | ICD-10-CM | POA: Diagnosis not present

## 2011-12-04 DIAGNOSIS — Z01419 Encounter for gynecological examination (general) (routine) without abnormal findings: Secondary | ICD-10-CM | POA: Diagnosis not present

## 2011-12-20 DIAGNOSIS — L0292 Furuncle, unspecified: Secondary | ICD-10-CM | POA: Diagnosis not present

## 2011-12-20 DIAGNOSIS — L253 Unspecified contact dermatitis due to other chemical products: Secondary | ICD-10-CM | POA: Diagnosis not present

## 2011-12-20 DIAGNOSIS — L909 Atrophic disorder of skin, unspecified: Secondary | ICD-10-CM | POA: Diagnosis not present

## 2011-12-20 DIAGNOSIS — L0293 Carbuncle, unspecified: Secondary | ICD-10-CM | POA: Diagnosis not present

## 2011-12-20 DIAGNOSIS — L919 Hypertrophic disorder of the skin, unspecified: Secondary | ICD-10-CM | POA: Diagnosis not present

## 2011-12-20 DIAGNOSIS — L0201 Cutaneous abscess of face: Secondary | ICD-10-CM | POA: Diagnosis not present

## 2011-12-20 DIAGNOSIS — L03211 Cellulitis of face: Secondary | ICD-10-CM | POA: Diagnosis not present

## 2011-12-25 DIAGNOSIS — N952 Postmenopausal atrophic vaginitis: Secondary | ICD-10-CM | POA: Diagnosis not present

## 2011-12-25 DIAGNOSIS — L293 Anogenital pruritus, unspecified: Secondary | ICD-10-CM | POA: Diagnosis not present

## 2012-01-02 ENCOUNTER — Other Ambulatory Visit: Payer: Self-pay | Admitting: Dermatology

## 2012-01-02 DIAGNOSIS — R21 Rash and other nonspecific skin eruption: Secondary | ICD-10-CM | POA: Diagnosis not present

## 2012-01-02 DIAGNOSIS — N760 Acute vaginitis: Secondary | ICD-10-CM | POA: Diagnosis not present

## 2012-01-02 DIAGNOSIS — L259 Unspecified contact dermatitis, unspecified cause: Secondary | ICD-10-CM | POA: Diagnosis not present

## 2012-01-06 ENCOUNTER — Emergency Department (HOSPITAL_COMMUNITY): Payer: Medicare Other

## 2012-01-06 ENCOUNTER — Observation Stay (HOSPITAL_COMMUNITY)
Admission: EM | Admit: 2012-01-06 | Discharge: 2012-01-07 | Disposition: A | Discharge status: Home / Self Care | Payer: Medicare Other | Attending: Internal Medicine | Admitting: Internal Medicine

## 2012-01-06 ENCOUNTER — Encounter (HOSPITAL_COMMUNITY): Payer: Self-pay | Admitting: Emergency Medicine

## 2012-01-06 DIAGNOSIS — K921 Melena: Secondary | ICD-10-CM | POA: Insufficient documentation

## 2012-01-06 DIAGNOSIS — K219 Gastro-esophageal reflux disease without esophagitis: Secondary | ICD-10-CM | POA: Insufficient documentation

## 2012-01-06 DIAGNOSIS — C4490 Unspecified malignant neoplasm of skin, unspecified: Secondary | ICD-10-CM

## 2012-01-06 DIAGNOSIS — Z9089 Acquired absence of other organs: Secondary | ICD-10-CM

## 2012-01-06 DIAGNOSIS — K529 Noninfective gastroenteritis and colitis, unspecified: Secondary | ICD-10-CM | POA: Diagnosis present

## 2012-01-06 DIAGNOSIS — R195 Other fecal abnormalities: Secondary | ICD-10-CM

## 2012-01-06 DIAGNOSIS — K589 Irritable bowel syndrome without diarrhea: Secondary | ICD-10-CM | POA: Diagnosis not present

## 2012-01-06 DIAGNOSIS — R21 Rash and other nonspecific skin eruption: Secondary | ICD-10-CM | POA: Insufficient documentation

## 2012-01-06 DIAGNOSIS — E860 Dehydration: Secondary | ICD-10-CM | POA: Insufficient documentation

## 2012-01-06 DIAGNOSIS — S92909A Unspecified fracture of unspecified foot, initial encounter for closed fracture: Secondary | ICD-10-CM

## 2012-01-06 DIAGNOSIS — K5289 Other specified noninfective gastroenteritis and colitis: Principal | ICD-10-CM | POA: Insufficient documentation

## 2012-01-06 DIAGNOSIS — E785 Hyperlipidemia, unspecified: Secondary | ICD-10-CM

## 2012-01-06 DIAGNOSIS — K5730 Diverticulosis of large intestine without perforation or abscess without bleeding: Secondary | ICD-10-CM | POA: Insufficient documentation

## 2012-01-06 DIAGNOSIS — Z79899 Other long term (current) drug therapy: Secondary | ICD-10-CM | POA: Insufficient documentation

## 2012-01-06 DIAGNOSIS — R197 Diarrhea, unspecified: Secondary | ICD-10-CM | POA: Diagnosis not present

## 2012-01-06 HISTORY — DX: Essential (primary) hypertension: I10

## 2012-01-06 HISTORY — DX: Diverticulosis of intestine, part unspecified, without perforation or abscess without bleeding: K57.90

## 2012-01-06 HISTORY — DX: Gastro-esophageal reflux disease without esophagitis: K21.9

## 2012-01-06 HISTORY — DX: Diverticulitis of intestine, part unspecified, without perforation or abscess without bleeding: K57.92

## 2012-01-06 LAB — CBC
Hemoglobin: 14.5 g/dL (ref 12.0–15.0)
MCH: 33.3 pg (ref 26.0–34.0)
MCHC: 35.3 g/dL (ref 30.0–36.0)
RDW: 12.9 % (ref 11.5–15.5)

## 2012-01-06 LAB — TYPE AND SCREEN

## 2012-01-06 LAB — CBC WITH DIFFERENTIAL/PLATELET
Basophils Absolute: 0 10*3/uL (ref 0.0–0.1)
Basophils Relative: 0 % (ref 0–1)
Eosinophils Absolute: 0.1 10*3/uL (ref 0.0–0.7)
HCT: 41.3 % (ref 36.0–46.0)
MCH: 32.8 pg (ref 26.0–34.0)
MCHC: 35.4 g/dL (ref 30.0–36.0)
Monocytes Absolute: 0.9 10*3/uL (ref 0.1–1.0)
Neutro Abs: 9.6 10*3/uL — ABNORMAL HIGH (ref 1.7–7.7)
Neutrophils Relative %: 80 % — ABNORMAL HIGH (ref 43–77)
RDW: 12.5 % (ref 11.5–15.5)

## 2012-01-06 LAB — PROTIME-INR: INR: 0.98 (ref 0.00–1.49)

## 2012-01-06 LAB — COMPREHENSIVE METABOLIC PANEL
BUN: 12 mg/dL (ref 6–23)
CO2: 24 mEq/L (ref 19–32)
Chloride: 98 mEq/L (ref 96–112)
Creatinine, Ser: 0.75 mg/dL (ref 0.50–1.10)
GFR calc non Af Amer: 80 mL/min — ABNORMAL LOW (ref >90)
Glucose, Bld: 87 mg/dL (ref 70–99)
Total Bilirubin: 0.8 mg/dL (ref 0.3–1.2)

## 2012-01-06 LAB — LIPASE, BLOOD: Lipase: 26 U/L (ref 11–59)

## 2012-01-06 LAB — URINE MICROSCOPIC-ADD ON

## 2012-01-06 LAB — URINALYSIS, ROUTINE W REFLEX MICROSCOPIC
Bilirubin Urine: NEGATIVE
Ketones, ur: NEGATIVE mg/dL
Nitrite: NEGATIVE
Protein, ur: NEGATIVE mg/dL

## 2012-01-06 LAB — APTT: aPTT: 26 seconds (ref 24–37)

## 2012-01-06 LAB — ABO/RH: ABO/RH(D): O POS

## 2012-01-06 MED ORDER — ONDANSETRON HCL 4 MG PO TABS: 4.0000 mg | ORAL_TABLET | Freq: Four times a day (QID) | ORAL | Status: DC | PRN

## 2012-01-06 MED ORDER — CIPROFLOXACIN IN D5W 400 MG/200ML IV SOLN
400.0000 mg | Freq: Two times a day (BID) | INTRAVENOUS | Status: DC
  Administered 2012-01-07: 400 mg via INTRAVENOUS
  Filled 2012-01-06 (×2): qty 200

## 2012-01-06 MED ORDER — SODIUM CHLORIDE 0.9 % IV SOLN
1000.0000 mL | INTRAVENOUS | Status: DC
  Administered 2012-01-06: 1000 mL via INTRAVENOUS

## 2012-01-06 MED ORDER — ACETAMINOPHEN 325 MG PO TABS
650.0000 mg | ORAL_TABLET | Freq: Four times a day (QID) | ORAL | Status: DC | PRN
  Filled 2012-01-06: qty 2

## 2012-01-06 MED ORDER — ALPRAZOLAM 0.5 MG PO TABS
0.5000 mg | ORAL_TABLET | Freq: Every evening | ORAL | Status: DC | PRN
  Administered 2012-01-06: 0.5 mg via ORAL
  Filled 2012-01-06: qty 1

## 2012-01-06 MED ORDER — HYDROMORPHONE HCL PF 1 MG/ML IJ SOLN: 0.5000 mg | INTRAMUSCULAR | Status: DC | PRN

## 2012-01-06 MED ORDER — SODIUM CHLORIDE 0.9 % IJ SOLN: 3.0000 mL | Freq: Two times a day (BID) | INTRAMUSCULAR | Status: DC

## 2012-01-06 MED ORDER — METRONIDAZOLE IN NACL 5-0.79 MG/ML-% IV SOLN
500.0000 mg | Freq: Three times a day (TID) | INTRAVENOUS | Status: DC
  Administered 2012-01-06: 500 mg via INTRAVENOUS
  Filled 2012-01-06: qty 100

## 2012-01-06 MED ORDER — PANTOPRAZOLE SODIUM 40 MG PO TBEC
40.0000 mg | DELAYED_RELEASE_TABLET | Freq: Every day | ORAL | Status: DC
  Administered 2012-01-06 – 2012-01-07 (×2): 40 mg via ORAL
  Filled 2012-01-06 (×2): qty 1

## 2012-01-06 MED ORDER — MECLIZINE HCL 25 MG PO TABS
25.0000 mg | ORAL_TABLET | Freq: Three times a day (TID) | ORAL | Status: DC | PRN
  Filled 2012-01-06: qty 1

## 2012-01-06 MED ORDER — IOHEXOL 300 MG/ML  SOLN
100.0000 mL | Freq: Once | INTRAMUSCULAR | Status: AC | PRN
  Administered 2012-01-06: 100 mL via INTRAVENOUS

## 2012-01-06 MED ORDER — SODIUM CHLORIDE 0.9 % IV SOLN
INTRAVENOUS | Status: AC
  Administered 2012-01-06: 16:00:00 via INTRAVENOUS

## 2012-01-06 MED ORDER — HYDROMORPHONE HCL PF 1 MG/ML IJ SOLN
0.5000 mg | INTRAMUSCULAR | Status: DC | PRN
  Filled 2012-01-06: qty 1

## 2012-01-06 MED ORDER — ONDANSETRON HCL 4 MG/2ML IJ SOLN: 4.0000 mg | Freq: Four times a day (QID) | INTRAMUSCULAR | Status: DC | PRN

## 2012-01-06 MED ORDER — METRONIDAZOLE IN NACL 5-0.79 MG/ML-% IV SOLN
500.0000 mg | Freq: Three times a day (TID) | INTRAVENOUS | Status: DC
  Administered 2012-01-06 – 2012-01-07 (×2): 500 mg via INTRAVENOUS
  Filled 2012-01-06 (×4): qty 100

## 2012-01-06 MED ORDER — ACETAMINOPHEN 650 MG RE SUPP: 650.0000 mg | Freq: Four times a day (QID) | RECTAL | Status: DC | PRN

## 2012-01-06 MED ORDER — ONDANSETRON HCL 4 MG/2ML IJ SOLN
4.0000 mg | Freq: Once | INTRAMUSCULAR | Status: AC
  Administered 2012-01-06: 4 mg via INTRAVENOUS
  Filled 2012-01-06: qty 2

## 2012-01-06 MED ORDER — CIPROFLOXACIN IN D5W 400 MG/200ML IV SOLN
400.0000 mg | Freq: Once | INTRAVENOUS | Status: DC
  Administered 2012-01-06: 400 mg via INTRAVENOUS
  Filled 2012-01-06: qty 200

## 2012-01-06 MED ORDER — PREDNISONE 10 MG PO TABS
30.0000 mg | ORAL_TABLET | Freq: Every day | ORAL | Status: DC
  Administered 2012-01-07: 30 mg via ORAL
  Filled 2012-01-06 (×2): qty 1

## 2012-01-06 MED ORDER — ALBUTEROL SULFATE (5 MG/ML) 0.5% IN NEBU: 2.5000 mg | INHALATION_SOLUTION | RESPIRATORY_TRACT | Status: DC | PRN

## 2012-01-06 MED ORDER — LORATADINE 10 MG PO TABS
10.0000 mg | ORAL_TABLET | Freq: Every day | ORAL | Status: DC
  Administered 2012-01-06 – 2012-01-07 (×2): 10 mg via ORAL
  Filled 2012-01-06 (×3): qty 1

## 2012-01-06 NOTE — ED Notes (Signed)
Pt refused dilaudid at this time

## 2012-01-06 NOTE — ED Notes (Signed)
Called report to [facility] on 4w.

## 2012-01-06 NOTE — ED Provider Notes (Signed)
History    CSN: [id number] Arrival date & time 01/06/12  7829 First MD Initiated Contact with Patient 01/06/12 [phone]     Chief Complaint  Patient presents with  . Abdominal Pain  . Rectal Bleeding    Patient is a 78 y.o. female presenting with abdominal pain. The history is provided by the patient.  Abdominal Pain The primary symptoms of the illness include abdominal pain, nausea and hematochezia. The primary symptoms of the illness do not include fever, fatigue, vomiting, diarrhea or dysuria. The current episode started yesterday. The onset of the illness was gradual.  The abdominal pain began yesterday. The pain came on gradually. The abdominal pain is generalized (although more in the upper abdomen). The abdominal pain does not radiate.  The hematochezia began today. The hematochezia has occurred 1 time per day. The hematochezia is a new problem.  Additional symptoms associated with the illness include anorexia (didn't eat since yesterday). Symptoms associated with the illness do not include chills, urgency, frequency or back pain. Significant associated medical issues include diverticulitis.    Past Medical History  Diagnosis Date  . Diverticulitis   . Diverticulosis     Past Surgical History  Procedure Date  . Cholecystectomy     Family History  Problem Relation Age of Onset  . Cancer Other   . Diabetes Other   . CAD Other     History  Substance Use Topics  . Smoking status: Never Smoker   . Smokeless tobacco: Not on file  . Alcohol Use: No    OB History    Grav Para Term Preterm Abortions TAB SAB Ect Mult Living                  Review of Systems  Constitutional: Negative for fever, chills and fatigue.  Gastrointestinal: Positive for nausea, abdominal pain, hematochezia and anorexia (didn't eat since yesterday). Negative for vomiting and diarrhea.  Genitourinary: Negative for dysuria, urgency and frequency.  Musculoskeletal: Negative for back pain.  Skin:  Positive for rash (taking prednisone for a recent skin condition).  Neurological: Negative for tremors.    Allergies  Bacitracin-polymyxin b; Doxepin hcl; Erythromycin; Levofloxacin; Lidocaine; Marcaine; Naproxen sodium; Penicillins; Pseudoephedrine; and Sulfonamide derivatives  Home Medications   Current Outpatient Rx  Name  Route  Sig  Dispense  Refill  . ALPRAZOLAM 1 MG PO TABS   Oral   Take 0.5 mg by mouth at bedtime as needed. For sleep         . DICYCLOMINE HCL 10 MG PO CAPS   Oral   Take 10 mg by mouth 3 (three) times daily as needed. For stomach cramping         . EZETIMIBE 10 MG PO TABS   Oral   Take 10 mg by mouth every Monday, Wednesday, and Friday.         Marland Kitchen FEXOFENADINE HCL 180 MG PO TABS   Oral   Take 180 mg by mouth daily.         Marland Kitchen MECLIZINE HCL 25 MG PO TABS   Oral   Take 25 mg by mouth 3 (three) times daily as needed. For vertigo         . OMEPRAZOLE 20 MG PO CPDR   Oral   Take 20 mg by mouth daily.         Marland Kitchen PREDNISONE 10 MG PO TABS   Oral   Take 30 mg by mouth daily. Tapered dose         .  ROSUVASTATIN CALCIUM 5 MG PO TABS   Oral   Take 2.5 mg by mouth daily.         Marland Kitchen VITAMIN D (ERGOCALCIFEROL) 50000 UNITS PO CAPS   Oral   Take 50,000 Units by mouth every 7 (seven) days. sunday           BP 129/69  Pulse 89  Temp 97.8 F (36.6 C) (Oral)  Resp 20  SpO2 100%  Physical Exam  Nursing note and vitals reviewed. Constitutional: She appears well-developed and well-nourished. No distress.  HENT:  Head: Normocephalic and atraumatic.  Right Ear: External ear normal.  Left Ear: External ear normal.  Eyes: Conjunctivae normal are normal. Right eye exhibits no discharge. Left eye exhibits no discharge. No scleral icterus.  Neck: Neck supple. No tracheal deviation present.  Cardiovascular: Normal rate, regular rhythm and intact distal pulses.   Pulmonary/Chest: Effort normal and breath sounds normal. No stridor. No respiratory  distress. She has no wheezes. She has no rales.  Abdominal: Soft. Bowel sounds are normal. She exhibits no distension. There is no tenderness. There is no rebound and no guarding.  Musculoskeletal: She exhibits no edema and no tenderness.  Neurological: She is alert. She has normal strength. No sensory deficit. Cranial nerve deficit:  no gross defecits noted. She exhibits normal muscle tone. She displays no seizure activity. Coordination normal.  Skin: Skin is warm and dry. No rash noted. She is not diaphoretic.  Psychiatric: She has a normal mood and affect.    ED Course  Procedures (including critical care time)  Medications  HYDROmorphone (DILAUDID) injection 0.5 mg (not administered)  0.9 %  sodium chloride infusion (1000 mL Intravenous New Bag/Given 01/06/12 0800)  ciprofloxacin (CIPRO) IVPB 400 mg (not administered)  metroNIDAZOLE (FLAGYL) IVPB 500 mg (not administered)  ondansetron (ZOFRAN) injection 4 mg (4 mg Intravenous Given 01/06/12 0801)  iohexol (OMNIPAQUE) 300 MG/ML solution 100 mL (100 mL Intravenous Contrast Given 01/06/12 1010)    Labs Reviewed  COMPREHENSIVE METABOLIC PANEL - Abnormal; Notable for the following:    Sodium 133 (*)     GFR calc non Af Amer 80 (*)     All other components within normal limits  URINALYSIS, ROUTINE W REFLEX MICROSCOPIC - Abnormal; Notable for the following:    Hgb urine dipstick LARGE (*)     Leukocytes, UA TRACE (*)     All other components within normal limits  CBC WITH DIFFERENTIAL - Abnormal; Notable for the following:    WBC 12.0 (*)     Neutrophils Relative 80 (*)     Neutro Abs 9.6 (*)     All other components within normal limits  OCCULT BLOOD, POC DEVICE - Abnormal; Notable for the following:    Fecal Occult Bld POSITIVE (*)     All other components within normal limits  LIPASE, BLOOD  TYPE AND SCREEN  PROTIME-INR  APTT  ABO/RH  URINE MICROSCOPIC-ADD ON   Ct Abdomen Pelvis W Contrast  01/06/2012  *RADIOLOGY REPORT*   Clinical Data: Diffuse abdominal pain.  Rectal bleeding.  History of irritable bowel syndrome.  Surgical history includes cholecystectomy.  CT ABDOMEN AND PELVIS WITH CONTRAST  Technique:  Multidetector CT imaging of the abdomen and pelvis was performed following the standard protocol during bolus administration of intravenous contrast.  Contrast: OMNIPAQUE IOHEXOL 300 MG/ML. Oral contrast was also administered.  Comparison: CT abdomen and pelvis 07/31/2011 [facility], CT abdomen 06/26/2008 [facility]  Findings: Wall thickening involving  the distal transverse and proximal and mid descending colon, with associated inflammation/edema in the surrounding mesenteric fat.  Similar wall thickening involving the cecum and ascending colon with less pericolonic inflammation/edema.  Remainder of the colon unremarkable.  Normal appearing small bowel.  Stomach normal in appearance by CT.  No ascites.  Mild diffuse hepatic steatosis without focal hepatic parenchymal abnormality.  Normal appearing spleen, pancreas, adrenal glands, and kidneys.  Gallbladder surgically absent.  No biliary ductal dilation.  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  No significant lymphadenopathy.  Urinary bladder unremarkable.  Uterus atrophic consistent with age. No adnexal masses or free pelvic fluid.  Bone window images demonstrate mild degenerative changes involving the lumbar spine.  Visualized lung bases clear.  Heart size normal.  IMPRESSION:  1.  Acute colitis involving the cecum, ascending colon, distal transverse colon, and proximal and mid descending colon. 2.  Mild diffuse hepatic steatosis without focal hepatic parenchymal abnormality.   Original Report Authenticated By: Hulan Saas, M.D.      1. Colitis     MDM  Pt's symptoms are related to colitis.  Likely infectious in nature.  She is improving although still having discomfort.  Considering her age, the rectal bleeding  and the extensive nature of her colitis will consult with the medical service for inpatient treatment.  Cipro and flagyl ordered.   Pt states she has taken cipro before without difficulty despite her levaquin allergy.        Celene Kras, MD 01/06/12 1050

## 2012-01-06 NOTE — ED Notes (Signed)
Pt states she developed abd pain yesterday afternoon and had diarrhea  Pt states she had nausea without vomiting  Pt states this morning when she got up she went to the bathroom and passed blood from her rectum  Pt states the blood was bright red  Pt states she has diverticulosis and has had diverticulitis

## 2012-01-06 NOTE — H&P (Signed)
[facility] History and Physical  COOPER STAMP ZOX:[id number] DOB: [birth year] DOA: 01/06/2012   PCP: Ezequiel Kayser, MD  Specialists: She is followed by a dermatologist, Dr. Nicholas Lose  Chief Complaint: Diarrhea and blood in the stool since yesterday  HPI: [name] is a 78 y.o. female with a past medical history of acid reflux disease, diverticulosis, who was in her usual state of health till yesterday after lunch, when she started having multiple loose stools. She ate a chicken quesadilla that she had bought at a [employer] the previous night. However, her husband also same ate the same, but he did not get any diarrhea. Patient had multiple episodes of loose, watery stools yesterday evening and night. She noticed a small amount of blood in the stool last night and then early morning today she had a large bowel movement, which was bloody. This was followed by a small bloody bowel movement at 6 AM. She decided to come in to the hospital. The symptoms have been associated with abdominal pain, which is described as a cramping pain. It was 10 out of 10 in intensity yesterday and currently, is about 4-5/10 in intensity. She hasn't had anything to eat or drink since all the symptoms started yesterday. She's never had these symptoms before. She admits to some chills but unsure if she had fever. She had nausea, but no vomiting. No dizziness. The abdominal cramping is located in the middle part of the abdomen without any radiation. She denies being on any oral antibiotics recently. She has been seeing her dermatologist for a skin rash that has been ongoing for some time. She was started on prednisone on Tuesday.  Home Medications: Prior to Admission medications   Medication Sig Start Date End Date Taking? Authorizing Provider  ALPRAZolam Prudy Feeler) 1 MG tablet Take 0.5 mg by mouth at bedtime as needed. For sleep   Yes Historical Provider, MD  dicyclomine (BENTYL) 10 MG capsule Take 10 mg  by mouth 3 (three) times daily as needed. For stomach cramping   Yes Historical Provider, MD  ezetimibe (ZETIA) 10 MG tablet Take 10 mg by mouth every Monday, Wednesday, and Friday.   Yes Historical Provider, MD  fexofenadine (ALLEGRA) 180 MG tablet Take 180 mg by mouth daily.   Yes Historical Provider, MD  meclizine (ANTIVERT) 25 MG tablet Take 25 mg by mouth 3 (three) times daily as needed. For vertigo   Yes Historical Provider, MD  omeprazole (PRILOSEC) 20 MG capsule Take 20 mg by mouth daily.   Yes Historical Provider, MD  predniSONE (DELTASONE) 10 MG tablet Take 30 mg by mouth daily. Tapered dose   Yes Historical Provider, MD  rosuvastatin (CRESTOR) 5 MG tablet Take 2.5 mg by mouth daily.   Yes Historical Provider, MD  Vitamin D, Ergocalciferol, (DRISDOL) 50000 UNITS CAPS Take 50,000 Units by mouth every 7 (seven) days. sunday   Yes Historical Provider, MD    Allergies:  Allergies  Allergen Reactions  . Bacitracin-Polymyxin B   . Doxepin Hcl   . Erythromycin   . Levofloxacin   . Lidocaine Other (See Comments)    Unknown reaction  . Marcaine (Bupivacaine Hcl)   . Naproxen Sodium   . Penicillins   . Pseudoephedrine   . Sulfonamide Derivatives     Past Medical History: Past Medical History  Diagnosis Date  . Diverticulitis   . Diverticulosis   . GERD (gastroesophageal reflux disease)     Past Surgical History  Procedure Date  . Cholecystectomy  Social History:  reports that she has never smoked. She does not have any smokeless tobacco history on file. She reports that she does not drink alcohol or use illicit drugs.  Living Situation: She lives in [place] with her husband Activity Level: Independent with her daily activities   Family History:  She reports a history of breast cancer in a sister, pancreatic cancer in a brother, heart disease in her mother.  Review of Systems - History obtained from the patient General ROS: positive for  - chills Psychological  ROS: negative Ophthalmic ROS: negative ENT ROS: negative Allergy and Immunology ROS: negative Hematological and Lymphatic ROS: negative Endocrine ROS: negative Respiratory ROS: no cough, shortness of breath, or wheezing Cardiovascular ROS: no chest pain or dyspnea on exertion Gastrointestinal ROS: as in hpi Genito-Urinary ROS: no dysuria, trouble voiding, or hematuria Musculoskeletal ROS: negative Neurological ROS: no TIA or stroke symptoms Dermatological ROS: as in hpi  Physical Examination  Filed Vitals:   01/06/12 0654 01/06/12 0757 01/06/12 1113  BP: 129/69 150/79 150/75  Pulse: 89 70 77  Temp: 97.8 F (36.6 C)    TempSrc: Oral    Resp: 20 18 16   SpO2: 100% 96% 93%    General appearance: alert, cooperative, appears stated age and no distress Head: Normocephalic, without obvious abnormality, atraumatic Eyes: conjunctivae/corneas clear. PERRL, EOM's intact.  Throat: lips, mucosa, and tongue normal; teeth and gums normal Neck: no adenopathy, no carotid bruit, no JVD, supple, symmetrical, trachea midline and thyroid not enlarged, symmetric, no tenderness/mass/nodules Resp: clear to auscultation bilaterally Cardio: regular rate and rhythm, S1, S2 normal, no murmur, click, rub or gallop GI: Abdomen is soft. There is minimal tenderness diffusely, without any rebound, rigidity, or guarding. No masses, or organomegaly appreciated. Bowel sounds are present. Extremities: extremities normal, atraumatic, no cyanosis or edema Pulses: 2+ and symmetric Skin: No obvious skin rashes present. Lymph nodes: Cervical, supraclavicular, and axillary nodes normal. Neurologic: She is alert and oriented x3. No focal neurological deficits are present  Laboratory Data: Results for orders placed during the hospital encounter of 01/06/12 (from the past 48 hour(s))  COMPREHENSIVE METABOLIC PANEL     Status: Abnormal   Collection Time   01/06/12  7:45 AM      Component Value Range Comment   Sodium  133 (*) 135 - 145 mEq/L    Potassium 3.9  3.5 - 5.1 mEq/L    Chloride 98  96 - 112 mEq/L    CO2 24  19 - 32 mEq/L    Glucose, Bld 87  70 - 99 mg/dL    BUN 12  6 - 23 mg/dL    Creatinine, Ser 1.61  0.50 - 1.10 mg/dL    Calcium 9.5  8.4 - 09.6 mg/dL    Total Protein 7.0  6.0 - 8.3 g/dL    Albumin 3.7  3.5 - 5.2 g/dL    AST 21  0 - 37 U/L    ALT 18  0 - 35 U/L    Alkaline Phosphatase 75  39 - 117 U/L    Total Bilirubin 0.8  0.3 - 1.2 mg/dL    GFR calc non Af Amer 80 (*) >90 mL/min    GFR calc Af Amer >90  >90 mL/min   LIPASE, BLOOD     Status: Normal   Collection Time   01/06/12  7:45 AM      Component Value Range Comment   Lipase 26  11 - 59 U/L   CBC WITH  DIFFERENTIAL     Status: Abnormal   Collection Time   01/06/12  7:45 AM      Component Value Range Comment   WBC 12.0 (*) 4.0 - 10.5 K/uL    RBC 4.45  3.87 - 5.11 MIL/uL    Hemoglobin 14.6  12.0 - 15.0 g/dL    HCT 16.1  09.6 - 04.5 %    MCV 92.8  78.0 - 100.0 fL    MCH 32.8  26.0 - 34.0 pg    MCHC 35.4  30.0 - 36.0 g/dL    RDW 40.9  81.1 - 91.4 %    Platelets 248  150 - 400 K/uL    Neutrophils Relative 80 (*) 43 - 77 %    Neutro Abs 9.6 (*) 1.7 - 7.7 K/uL    Lymphocytes Relative 12  12 - 46 %    Lymphs Abs 1.4  0.7 - 4.0 K/uL    Monocytes Relative 8  3 - 12 %    Monocytes Absolute 0.9  0.1 - 1.0 K/uL    Eosinophils Relative 0  0 - 5 %    Eosinophils Absolute 0.1  0.0 - 0.7 K/uL    Basophils Relative 0  0 - 1 %    Basophils Absolute 0.0  0.0 - 0.1 K/uL   TYPE AND SCREEN     Status: Normal   Collection Time   01/06/12  7:45 AM      Component Value Range Comment   ABO/RH(D) O POS      Antibody Screen NEG      Sample Expiration 01/09/2012     ABO/RH     Status: Normal   Collection Time   01/06/12  7:45 AM      Component Value Range Comment   ABO/RH(D) O POS     OCCULT BLOOD, POC DEVICE     Status: Abnormal   Collection Time   01/06/12  8:01 AM      Component Value Range Comment   Fecal Occult Bld POSITIVE (*)  NEGATIVE   URINALYSIS, ROUTINE W REFLEX MICROSCOPIC     Status: Abnormal   Collection Time   01/06/12  8:02 AM      Component Value Range Comment   Color, Urine YELLOW  YELLOW    APPearance CLEAR  CLEAR    Specific Gravity, Urine 1.010  1.005 - 1.030    pH 7.0  5.0 - 8.0    Glucose, UA NEGATIVE  NEGATIVE mg/dL    Hgb urine dipstick LARGE (*) NEGATIVE    Bilirubin Urine NEGATIVE  NEGATIVE    Ketones, ur NEGATIVE  NEGATIVE mg/dL    Protein, ur NEGATIVE  NEGATIVE mg/dL    Urobilinogen, UA 0.2  0.0 - 1.0 mg/dL    Nitrite NEGATIVE  NEGATIVE    Leukocytes, UA TRACE (*) NEGATIVE   URINE MICROSCOPIC-ADD ON     Status: Normal   Collection Time   01/06/12  8:02 AM      Component Value Range Comment   Squamous Epithelial / LPF RARE  RARE    WBC, UA 0-2  <3 WBC/hpf    RBC / HPF 0-2  <3 RBC/hpf    Bacteria, UA RARE  RARE   PROTIME-INR     Status: Normal   Collection Time   01/06/12  8:09 AM      Component Value Range Comment   Prothrombin Time 12.9  11.6 - 15.2 seconds    INR 0.98  0.00 -  1.49   APTT     Status: Normal   Collection Time   01/06/12  8:09 AM      Component Value Range Comment   aPTT 26  24 - 37 seconds     Radiology Reports: Ct Abdomen Pelvis W Contrast  01/06/2012  *RADIOLOGY REPORT*  Clinical Data: Diffuse abdominal pain.  Rectal bleeding.  History of irritable bowel syndrome.  Surgical history includes cholecystectomy.  CT ABDOMEN AND PELVIS WITH CONTRAST  Technique:  Multidetector CT imaging of the abdomen and pelvis was performed following the standard protocol during bolus administration of intravenous contrast.  Contrast: OMNIPAQUE IOHEXOL 300 MG/ML. Oral contrast was also administered.  Comparison: CT abdomen and pelvis 07/31/2011 [facility], CT abdomen 06/26/2008 [facility].  Findings: Wall thickening involving the distal transverse and proximal and mid descending colon, with associated inflammation/edema in the surrounding  mesenteric fat.  Similar wall thickening involving the cecum and ascending colon with less pericolonic inflammation/edema.  Remainder of the colon unremarkable.  Normal appearing small bowel.  Stomach normal in appearance by CT.  No ascites.  Mild diffuse hepatic steatosis without focal hepatic parenchymal abnormality.  Normal appearing spleen, pancreas, adrenal glands, and kidneys.  Gallbladder surgically absent.  No biliary ductal dilation.  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  No significant lymphadenopathy.  Urinary bladder unremarkable.  Uterus atrophic consistent with age. No adnexal masses or free pelvic fluid.  Bone window images demonstrate mild degenerative changes involving the lumbar spine.  Visualized lung bases clear.  Heart size normal.  IMPRESSION:  1.  Acute colitis involving the cecum, ascending colon, distal transverse colon, and proximal and mid descending colon. 2.  Mild diffuse hepatic steatosis without focal hepatic parenchymal abnormality.   Original Report Authenticated By: Hulan Saas, M.D.      Problem List  Principal Problem:  *Acute colitis Active Problems:  GASTROESOPHAGEAL REFLUX DISEASE  Hematochezia  Dehydration   Assessment: This is a 78 year old, Caucasian female, with a past medical history of GERD and diverticulosis, who presents with the diarrhea since last night. She had episode of bright red blood per rectum this morning. CT scan is suggestive of acute colitis. This could be food borne illness considering that she ate something bought in a restaurant yesterday afternoon. Although her husband also ate the same and did not get similar symptoms. C. difficile is always in the differential. Hematochezia is probably related to the colitis.  Plan: #1 acute colitis with diarrhea: We will treat her with ciprofloxacin and Flagyl. I verified her ciprofloxacin allergy, and she reports only nausea with it. There is no history of true allergic  reaction We'll get stool studies. C. difficile will be ruled out as well. IV fluids will be provided. She does not appear to be septic at this time.  #2 hematochezia: Likely related to the above. We'll monitor her CBC closely. She reports colonoscopy within the last 3-4 years and was told by her gastroenterologist, Dr. Russella Dar, that she doesn't need any more studies. If she has recurrence of her rectal bleeding gastroenterology will be consulted.  #3 mild dehydration: Will give her gentle IV hydration.  #4 history of acid reflux disease: Continue with PPI  #5 skin rash: She had a biopsy of her skin on December 17 and the report suggests that she has some form of a dermatitis. For now we will continue with her prednisone. She doesn't have any overt skin rash at this time. We will quickly taper the prednisone.  DVT, prophylaxis with SCDs  Further management decisions will depend on results of further testing and patient's response to treatment.  Code Status: She is a full code Family Communication: All of the above was discussed with the patient and her husband, who was in the room with her.  Disposition Plan: She will likely return home in improved   [facility]  [facility] Pager [phone]  If 7PM-7AM, please contact night-coverage [email or web address] Password [facility]  01/06/2012, 12:05 PM

## 2012-01-06 NOTE — ED Notes (Signed)
Patient is alert and oriented x3.  She is complaining of abdominal pain that started Yesterday.  This morning she states that she went to have a bowel movement and Resulted bright red blood.  She added that she has a history of diverticulosis

## 2012-01-06 NOTE — ED Notes (Signed)
Pt left ED to be admitted with normal saline running at 125 ml/hr and Cipro running at 200 ml/hr.

## 2012-01-07 DIAGNOSIS — R195 Other fecal abnormalities: Secondary | ICD-10-CM | POA: Diagnosis not present

## 2012-01-07 DIAGNOSIS — C4490 Unspecified malignant neoplasm of skin, unspecified: Secondary | ICD-10-CM | POA: Diagnosis not present

## 2012-01-07 DIAGNOSIS — K5289 Other specified noninfective gastroenteritis and colitis: Secondary | ICD-10-CM | POA: Diagnosis not present

## 2012-01-07 LAB — COMPREHENSIVE METABOLIC PANEL
ALT: 13 U/L (ref 0–35)
Albumin: 3 g/dL — ABNORMAL LOW (ref 3.5–5.2)
Alkaline Phosphatase: 62 U/L (ref 39–117)
BUN: 8 mg/dL (ref 6–23)
Calcium: 8.8 mg/dL (ref 8.4–10.5)
GFR calc Af Amer: 79 mL/min — ABNORMAL LOW (ref >90)
Glucose, Bld: 81 mg/dL (ref 70–99)
Potassium: 3.7 mEq/L (ref 3.5–5.1)
Sodium: 137 mEq/L (ref 135–145)
Total Protein: 5.7 g/dL — ABNORMAL LOW (ref 6.0–8.3)

## 2012-01-07 LAB — CBC
HCT: 37 % (ref 36.0–46.0)
Hemoglobin: 12.9 g/dL (ref 12.0–15.0)
MCV: 94.4 fL (ref 78.0–100.0)
WBC: 11 10*3/uL — ABNORMAL HIGH (ref 4.0–10.5)

## 2012-01-07 MED ORDER — CIPROFLOXACIN HCL 500 MG PO TABS: 500.0000 mg | ORAL_TABLET | Freq: Two times a day (BID) | ORAL | Status: DC

## 2012-01-07 MED ORDER — METRONIDAZOLE 500 MG PO TABS: 500.0000 mg | ORAL_TABLET | Freq: Three times a day (TID) | ORAL | Status: DC

## 2012-01-07 MED ORDER — ALPRAZOLAM 1 MG PO TABS: 0.5000 mg | ORAL_TABLET | Freq: Every evening | ORAL | Status: DC | PRN

## 2012-01-07 NOTE — Progress Notes (Signed)
MT reported pt had 2 beats PVC. Pt is asymptomatic and sleeping. Will monitor for any changes in mentation or more irregular heartbeats.

## 2012-01-07 NOTE — Discharge Summary (Signed)
Physician Discharge Summary  [name] ZOX:[id number] DOB: December 01, 1932 DOA: 01/06/2012  PCP: Ezequiel Kayser, MD  Admit date: 01/06/2012 Discharge date: 01/07/2012  Recommendations for Outpatient Follow-up:  1. Pt will need to follow up with PCP in 2-3 weeks post discharge 2. Please obtain BMP to evaluate electrolytes and kidney function 3. Please also check CBC to evaluate Hg and Hct levels 4. Please note that C. Diff was negative and pt was discharged home on Ciprofloxacin and Flagyl to complete the therapy for 14 days   Discharge Diagnoses: Acute colitis Principal Problem:  *Acute colitis Active Problems:  GASTROESOPHAGEAL REFLUX DISEASE  Hematochezia  Dehydration  Discharge Condition: Stable  Diet recommendation: Heart healthy diet discussed in details   History of present illness:  Pt is 78 y.o. female with a past medical history of acid reflux disease, diverticulosis, who was in her usual state of health till yesterday after lunch, when she started having multiple loose stools. She ate a chicken quesadilla that she had bought at a [employer] the previous night. However, her husband also same ate the same, but he did not get any diarrhea. Patient had multiple episodes of loose, watery stools yesterday evening and night. She noticed a small amount of blood in the stool last night and then early morning today she had a large bowel movement, which was bloody. This was followed by a small bloody bowel movement at 6 AM. She decided to come in to the hospital. The symptoms have been associated with abdominal pain, which is described as a cramping pain. It was 10 out of 10 in intensity yesterday and currently, is about 4-5/10 in intensity. She hasn't had anything to eat or drink since all the symptoms started yesterday. She's never had these symptoms before. She admits to some chills but unsure if she had fever. She had nausea, but no vomiting. No dizziness. The abdominal  cramping is located in the middle part of the abdomen without any radiation. She denies being on any oral antibiotics recently. She has been seeing her dermatologist for a skin rash that has been ongoing for some time. She was started on prednisone on Tuesday.   Hospital Course:  Principal Problem:  *Acute colitis - pt started on Cipro and flagyl and will continue the therapy for 14 days - pt also provided with IVF, analgesia and antiemetics and has responded well to the therapy  - pt has clinically improved since admission and requesting to be discharged today - electrolytes reviewed and within normal limits - pt denies diarrhea or abdominal pain Active Problems:  GASTROESOPHAGEAL REFLUX DISEASE - stable  Procedures/Studies: Ct Abdomen Pelvis W Contrast 01/06/2012   1.  Acute colitis involving the cecum, ascending colon, distal transverse colon, and proximal and mid descending colon.  2.  Mild diffuse hepatic steatosis without focal hepatic parenchymal abnormality.     Consultations:  None  Antibiotics:  Flagyl  Ciprofloxacin  Discharge Exam: Filed Vitals:   01/07/12 0559  BP: 102/55  Pulse: 67  Temp: 97.6 F (36.4 C)  Resp: 16   Filed Vitals:   01/06/12 1300 01/06/12 1659 01/06/12 2208 01/07/12 0559  BP: 135/59 108/51 125/63 102/55  Pulse: 79 64 72 67  Temp: 97.9 F (36.6 C) 98.1 F (36.7 C) 97.7 F (36.5 C) 97.6 F (36.4 C)  TempSrc: Oral Oral Oral Axillary  Resp: 18 16 16 16   Height: 5' 1.5" (1.562 m)     Weight: 61.1 kg (134 lb 11.2 oz)  SpO2: 99% 97% 97% 97%    General: Pt is alert, follows commands appropriately, not in acute distress Cardiovascular: Regular rate and rhythm, S1/S2 +, no murmurs, no rubs, no gallops Respiratory: Clear to auscultation bilaterally, no wheezing, no crackles, no rhonchi Abdominal: Soft, non tender, non distended, bowel sounds +, no guarding Extremities: no edema, no cyanosis, pulses palpable bilaterally DP and  PT Neuro: Grossly nonfocal  Discharge Instructions  Discharge Orders    Future Orders Please Complete By Expires   Diet - low sodium heart healthy      Increase activity slowly          Medication List     As of 01/07/2012  7:11 AM    TAKE these medications         ALPRAZolam 1 MG tablet   Commonly known as: XANAX   Take 0.5 tablets (0.5 mg total) by mouth at bedtime as needed. For sleep      ciprofloxacin 500 MG tablet   Commonly known as: CIPRO   Take 1 tablet (500 mg total) by mouth 2 (two) times daily.      dicyclomine 10 MG capsule   Commonly known as: BENTYL   Take 10 mg by mouth 3 (three) times daily as needed. For stomach cramping      ezetimibe 10 MG tablet   Commonly known as: ZETIA   Take 10 mg by mouth every Monday, Wednesday, and Friday.      fexofenadine 180 MG tablet   Commonly known as: ALLEGRA   Take 180 mg by mouth daily.      meclizine 25 MG tablet   Commonly known as: ANTIVERT   Take 25 mg by mouth 3 (three) times daily as needed. For vertigo      metroNIDAZOLE 500 MG tablet   Commonly known as: FLAGYL   Take 1 tablet (500 mg total) by mouth 3 (three) times daily.      omeprazole 20 MG capsule   Commonly known as: PRILOSEC   Take 20 mg by mouth daily.      predniSONE 10 MG tablet   Commonly known as: DELTASONE   Take 30 mg by mouth daily. Tapered dose      rosuvastatin 5 MG tablet   Commonly known as: CRESTOR   Take 2.5 mg by mouth daily.      Vitamin D (Ergocalciferol) 50000 UNITS Caps   Commonly known as: DRISDOL   Take 50,000 Units by mouth every 7 (seven) days. sunday           Follow-up Information    Follow up with PERINI,MARK A, MD. In 1 week.   Contact information:   [address] [place] [address] [phone]           The results of significant diagnostics from this hospitalization (including imaging, microbiology, ancillary and laboratory) are listed below for reference.     Microbiology: No results  found for this or any previous visit (from the past 240 hour(s)).   Labs: Basic Metabolic Panel:  Lab 01/07/12 6433 01/06/12 0745  NA 137 133*  K 3.7 3.9  CL 105 98  CO2 26 24  GLUCOSE 81 87  BUN 8 12  CREATININE 0.81 0.75  CALCIUM 8.8 9.5  MG -- --  PHOS -- --   Liver Function Tests:  Lab 01/07/12 0519 01/06/12 0745  AST 12 21  ALT 13 18  ALKPHOS 62 75  BILITOT 0.7 0.8  PROT 5.7* 7.0  ALBUMIN 3.0* 3.7    Lab 01/06/12 0745  LIPASE 26  AMYLASE --   CBC:  Lab 01/07/12 0519 01/06/12 1740 01/06/12 0745  WBC 11.0* 11.0* 12.0*  NEUTROABS -- -- 9.6*  HGB 12.9 14.5 14.6  HCT 37.0 41.1 41.3  MCV 94.4 94.5 92.8  PLT 232 249 248   SIGNED: Time coordinating discharge: Over 30 minutes  Debbora Presto, MD  [facility] 01/07/2012, 7:11 AM Pager [phone]  If 7PM-7AM, please contact night-coverage [email or web address] Password [email or web address]

## 2012-01-07 NOTE — Progress Notes (Signed)
UR Completed.  [name] [phone] 01/07/2012  

## 2012-01-07 NOTE — Progress Notes (Signed)
Per Lab, patients pending C-diff PCR may not be completed today due to weekend scheduling.  Patient has discharge order pending C-diff results.

## 2012-01-07 NOTE — Progress Notes (Signed)
c-diff negative.  Discussed discharge paperwork and need to get antibiotic medication filled today to begin treatment.

## 2012-01-11 LAB — STOOL CULTURE

## 2012-01-16 DIAGNOSIS — K589 Irritable bowel syndrome without diarrhea: Secondary | ICD-10-CM | POA: Diagnosis not present

## 2012-01-16 DIAGNOSIS — K529 Noninfective gastroenteritis and colitis, unspecified: Secondary | ICD-10-CM | POA: Diagnosis not present

## 2012-01-17 DIAGNOSIS — C44311 Basal cell carcinoma of skin of nose: Secondary | ICD-10-CM

## 2012-01-17 HISTORY — DX: Basal cell carcinoma of skin of nose: C44.311

## 2012-01-17 HISTORY — PX: MOHS SURGERY: SUR867

## 2012-01-25 DIAGNOSIS — K529 Noninfective gastroenteritis and colitis, unspecified: Secondary | ICD-10-CM | POA: Diagnosis not present

## 2012-01-29 ENCOUNTER — Ambulatory Visit (INDEPENDENT_AMBULATORY_CARE_PROVIDER_SITE_OTHER): Payer: Medicare Other | Admitting: Gastroenterology

## 2012-01-29 ENCOUNTER — Encounter: Payer: Self-pay | Admitting: Gastroenterology

## 2012-01-29 VITALS — BP 122/64 | HR 72 | Ht 61.0 in | Wt 134.0 lb

## 2012-01-29 DIAGNOSIS — R933 Abnormal findings on diagnostic imaging of other parts of digestive tract: Secondary | ICD-10-CM

## 2012-01-29 DIAGNOSIS — K5289 Other specified noninfective gastroenteritis and colitis: Secondary | ICD-10-CM | POA: Diagnosis not present

## 2012-01-29 MED ORDER — PEG-KCL-NACL-NASULF-NA ASC-C 100 G PO SOLR: 1.0000 | Freq: Once | ORAL | Status: DC

## 2012-01-29 NOTE — Progress Notes (Signed)
History of Present Illness: This is a 79 year old female hospitalized in December 2013 with acute onset of bloody diarrhea and abdominal pain. CT scan revealed colonic wall thickening from the cecum to the mid descending colon. C. difficile specimen was negative. No other stool specimens results are listed. She was placed empirically on Cipro and Flagyl for presumed infectious process. Her abdominal pain and bleeding resolved rapidly however she has had ongoing problems with diarrhea since discharge. On December 31 she was advised to discontinue milk products and begin a low residue diet and her symptoms improved. She had problems tolerating Flagyl so this was discontinued. She has been retreated with a second course of Cipro. Over the past 1-2 days her diarrhea symptoms have significantly improved and she's only had one bowel movement so far today.  Current Medications, Allergies, Past Medical History, Past Surgical History, Family History and Social History were reviewed in [employer] record.  Physical Exam: General: Well developed , well nourished, no acute distress Head: Normocephalic and atraumatic Eyes:  sclerae anicteric, EOMI Ears: Normal auditory acuity Mouth: No deformity or lesions Lungs: Clear throughout to auscultation Heart: Regular rate and rhythm; no murmurs, rubs or bruits Abdomen: Soft, non tender and non distended. No masses, hepatosplenomegaly or hernias noted. Normal Bowel sounds Musculoskeletal: Symmetrical with no gross deformities  Pulses:  Normal pulses noted Extremities: No clubbing, cyanosis, edema or deformities noted Neurological: Alert oriented x 4, grossly nonfocal Psychological:  Alert and cooperative. Normal mood and affect  Assessment and Recommendations:  1. Unspecified colitis, likely ischemic or infectious. Rule out IBD. Complete current course of Cipro and then discontinue antibiotics. Continue Bentyl as needed. Continue a low  residue, low fat, lactose free and caffeine free diet. Schedule colonoscopy. The risks, benefits, and alternatives to colonoscopy with possible biopsy and possible polypectomy were discussed with the patient and they consent to proceed.

## 2012-01-29 NOTE — Patient Instructions (Addendum)
You have been scheduled for a colonoscopy with propofol. Please follow written instructions given to you at your visit today.  Please pick up your prep kit at the pharmacy within the next 1-3 days. If you use inhalers (even only as needed) or a CPAP machine, please bring them with you on the day of your procedure.  Low-Fiber Diet Fiber is found in fruits, vegetables, and grains. A low-fiber diet restricts fibrous foods that are not digested in the small intestine. A diet containing about 10 grams of fiber is considered low fiber.  PURPOSE  To prevent blockage of a partially obstructed or narrowed gastrointestinal tract.  To reduce fecal weight and volume.  To slow the movement of feces. WHEN IS THIS DIET USED?  It may be used during the acute phase of Crohn disease, ulcerative colitis, regional enteritis, or diverticulitis.  It may be used if your intestinal or esophageal tubes are narrowing (stenosis).  It may be used as a transitional diet following surgery, injury (trauma), or illness. CHOOSING FOODS Check labels, especially on foods from the starch list. Often times, dietary fiber content is listed on the nutrition facts panel. Please ask your Registered Dietitian if you have questions about specific foods that are related to your condition, especially if the food is not listed on this handout. Breads and Starches  Allowed: White, [place], and pita breads, plain rolls, buns, or sweet rolls, doughnuts, waffles, pancakes, bagels. Plain muffins, biscuits, matzoth. Soda, saltine, graham crackers. Pretzels, rusks, melba toast, zwieback. Cooked cereals: cornmeal, farina, or cream cereals. Dry cereals: refined corn, wheat, rice, and oat cereals (check label). Potatoes prepared any way without skins, refined macaroni, spaghetti, noodles, refined rice.  Avoid: Whole-wheat bread, rolls, and crackers. Multigrains, rye, bran seeds, nuts, or coconut. Cereals containing whole grains, multigrains,  bran, coconut, nuts, raisins. Cooked or dry oatmeal. Coarse wheat cereals, granola. Cereals advertised as "high fiber." Potato skins. Whole-grain pasta, wild or brown rice. Popcorn. Vegetables  Allowed: Strained tomato and vegetable juices. Fresh lettuce, cucumber, spinach. Well-cooked or canned: asparagus, bean sprouts, broccoli, cut green beans, cauliflower, pumpkin, beets, mushrooms, olives, yellow squash, tomato, tomato sauce, zucchini, turnips.Keep servings limited to  cup.  Avoid: Fresh, cooked, or canned: artichokes, baked beans, beet greens, Brussels sprouts, corn, kale, legumes, peas, sweet potatoes. Avoid large servings of any vegetables. Fruit  Allowed: All fruit juices except prune juice. Cooked or canned fruits without skin and seeds: apricots, applesauce, cantaloupe, cherries, grapefruit, grapes, kiwi, mandarin oranges, peaches, pears, fruit cocktail, pineapple, plums, watermelon. Fresh without skin: banana, grapes, cantaloupe, avocado, cherries, pineapple, kiwi, nectarines, peaches, blueberries. Keep servings limited to  cup or 1 piece.  Avoid: Fresh: apples with or without skin, apricots, mangoes, pears, raspberries, strawberries. Prune juice and juices with pulp, stewed or dried prunes. Dried fruits, raisins, dates. Avoid large servings of all fresh fruits. Meat and Protein Substitutes  Allowed: Ground or well-cooked tender beef, ham, veal, lamb, pork, poultry. Eggs, plain cheese. Fish, oysters, shrimp, lobster, other seafood. Liver, organ meats. Smooth nut butters.  Avoid: Tough, fibrous meats with gristle. Chunky nut butter.Cheese with seeds, nuts, or other foods not allowed. Nuts, seeds, legumes, dried peas, beans, lentils. Dairy  Allowed: All milk products except those not allowed.  Avoid: Yogurt or cheese that contains nuts, seeds, or added fruit. Soups and Combination Foods  Allowed: Bouillon, broth, or cream soups made from allowed foods. Any strained soup.  Casseroles or mixed dishes made with allowed foods.  Avoid: Soups made from vegetables that  are not allowed or that contain other foods not allowed. Desserts and Sweets  Allowed:Plain cakes and cookies, pie made with allowed fruit, pudding, custard, cream pie. Gelatin, fruit, ice, sherbet, frozen ice pops. Ice cream, ice milk without nuts. Plain hard candy, honey, jelly, molasses, syrup, sugar, chocolate syrup, gumdrops, marshmallows.  Avoid: Desserts, cookies, or candies that contain nuts, peanut butter, dried fruits. Jams, preserves with seeds, marmalade. Fats and Oils  Allowed:Margarine, butter, cream, mayonnaise, salad oils, plain salad dressings made from allowed foods.  Avoid: Seeds, nuts, olives. Beverages  Allowed: All, except those listed to avoid.  Avoid: Fruit juices with high pulp, prune juice. Condiments  Allowed:Ketchup, mustard, horseradish, vinegar, cream sauce, cheese sauce, cocoa powder. Spices in moderation: allspice, basil, bay leaves, celery powder or leaves, cinnamon, cumin powder, curry powder, ginger, mace, marjoram, onion or garlic powder, oregano, paprika, parsley flakes, ground pepper, rosemary, sage, savory, tarragon, thyme, turmeric.  Avoid: Coconut, pickles. SAMPLE MENU Breakfast   cup orange juice.  1 boiled egg.  1 slice white toast.  Margarine.   cup cornflakes.  1 cup milk.  Beverage. Lunch   cup chicken noodle soup.  2 to 3 oz sliced roast beef.  2 slices white bread.  Mayonnaise.   cup tomato juice.  1 small banana.  Beverage. Dinner  3 oz baked chicken.   cup scalloped potatoes.   cup cooked beets.  White dinner roll.  Margarine.   cup canned peaches.  Beverage. Document Released: 06/24/2001 Document Revised: 03/27/2011 Document Reviewed: 01/19/2011 [facility] Patient Information 2013 [place], [place].  cc: Rodrigo Ran, MD

## 2012-02-02 ENCOUNTER — Encounter: Payer: Self-pay | Admitting: Gastroenterology

## 2012-02-02 ENCOUNTER — Ambulatory Visit (AMBULATORY_SURGERY_CENTER): Payer: Medicare Other | Admitting: Gastroenterology

## 2012-02-02 VITALS — BP 127/74 | HR 60 | Temp 96.6°F | Resp 12 | Ht 61.0 in | Wt 134.0 lb

## 2012-02-02 DIAGNOSIS — R109 Unspecified abdominal pain: Secondary | ICD-10-CM | POA: Diagnosis not present

## 2012-02-02 DIAGNOSIS — R197 Diarrhea, unspecified: Secondary | ICD-10-CM

## 2012-02-02 DIAGNOSIS — K529 Noninfective gastroenteritis and colitis, unspecified: Secondary | ICD-10-CM

## 2012-02-02 DIAGNOSIS — D126 Benign neoplasm of colon, unspecified: Secondary | ICD-10-CM | POA: Diagnosis not present

## 2012-02-02 DIAGNOSIS — K5289 Other specified noninfective gastroenteritis and colitis: Secondary | ICD-10-CM

## 2012-02-02 DIAGNOSIS — I10 Essential (primary) hypertension: Secondary | ICD-10-CM | POA: Diagnosis not present

## 2012-02-02 DIAGNOSIS — R933 Abnormal findings on diagnostic imaging of other parts of digestive tract: Secondary | ICD-10-CM | POA: Diagnosis not present

## 2012-02-02 MED ORDER — SODIUM CHLORIDE 0.9 % IV SOLN: 500.0000 mL | INTRAVENOUS | Status: DC

## 2012-02-02 NOTE — Progress Notes (Signed)
Patient did not experience any of the following events: a burn prior to discharge; a fall within the facility; wrong site/side/patient/procedure/implant event; or a hospital transfer or hospital admission upon discharge from the facility. (G8907) Patient did not have preoperative order for IV antibiotic SSI prophylaxis. (G8918)  

## 2012-02-02 NOTE — Progress Notes (Signed)
Called to room to assist during endoscopic procedure.  Patient ID and intended procedure confirmed with present staff. Received instructions for my participation in the procedure from the performing physician.  

## 2012-02-02 NOTE — Patient Instructions (Signed)
YOU HAD AN ENDOSCOPIC PROCEDURE TODAY AT THE [facility]: Refer to the procedure report that was given to you for any specific questions about what was found during the examination.  If the procedure report does not answer your questions, please call your gastroenterologist to clarify.  If you requested that your care partner not be given the details of your procedure findings, then the procedure report has been included in a sealed envelope for you to review at your convenience later.  YOU SHOULD EXPECT: Some feelings of bloating in the abdomen. Passage of more gas than usual.  Walking can help get rid of the air that was put into your GI tract during the procedure and reduce the bloating. If you had a lower endoscopy (such as a colonoscopy or flexible sigmoidoscopy) you may notice spotting of blood in your stool or on the toilet paper. If you underwent a bowel prep for your procedure, then you may not have a normal bowel movement for a few days.  DIET: Your first meal following the procedure should be a light meal and then it is ok to progress to your normal diet.  A half-sandwich or bowl of soup is an example of a good first meal.  Heavy or fried foods are harder to digest and may make you feel nauseous or bloated.  Likewise meals heavy in dairy and vegetables can cause extra gas to form and this can also increase the bloating.  Drink plenty of fluids but you should avoid alcoholic beverages for 24 hours.  ACTIVITY: Your care partner should take you home directly after the procedure.  You should plan to take it easy, moving slowly for the rest of the day.  You can resume normal activity the day after the procedure however you should NOT DRIVE or use heavy machinery for 24 hours (because of the sedation medicines used during the test).    SYMPTOMS TO REPORT IMMEDIATELY: A gastroenterologist can be reached at any hour.  During normal business hours, 8:30 AM to 5:00 PM Monday through Friday,  call [phone].  After hours and on weekends, please call the GI answering service at [phone] who will take a message and have the physician on call contact you.   Following lower endoscopy (colonoscopy or flexible sigmoidoscopy):  Excessive amounts of blood in the stool  Significant tenderness or worsening of abdominal pains  Swelling of the abdomen that is new, acute  Fever of 100F or higher    FOLLOW UP: If any biopsies were taken you will be contacted by phone or by letter within the next 1-3 weeks.  Call your gastroenterologist if you have not heard about the biopsies in 3 weeks.  Our staff will call the home number listed on your records the next business day following your procedure to check on you and address any questions or concerns that you may have at that time regarding the information given to you following your procedure. This is a courtesy call and so if there is no answer at the home number and we have not heard from you through the emergency physician on call, we will assume that you have returned to your regular daily activities without incident.  SIGNATURES/CONFIDENTIALITY: You and/or your care partner have signed paperwork which will be entered into your electronic medical record.  These signatures attest to the fact that that the information above on your After Visit Summary has been reviewed and is understood.  Full responsibility of the confidentiality   of this discharge information lies with you and/or your care-partner.     

## 2012-02-02 NOTE — Op Note (Signed)
[facility] [address] [place], [address]   COLONOSCOPY PROCEDURE REPORT  PATIENT: [name], [name]  MR#: [id number] BIRTHDATE: 05-30-1932 , 79  yrs. old GENDER: Female ENDOSCOPIST: Meryl Dare, MD, [facility] PROCEDURE DATE:  02/02/2012 PROCEDURE:   Colonoscopy with biopsy ASA CLASS:   Class II INDICATIONS:an abnormal CT and Unexplained diarrhea. MEDICATIONS: MAC sedation, administered by CRNA and propofol (Diprivan) 110mg  IV DESCRIPTION OF PROCEDURE:   After the risks benefits and alternatives of the procedure were thoroughly explained, informed consent was obtained.  A digital rectal exam revealed no abnormalities of the rectum.   The LB [id number]  endoscope was introduced through the anus and advanced to the cecum, which was identified by both the appendix and ileocecal valve. No adverse events experienced.   Limited by a tortuous and redundant colon. The quality of the prep was excellent, using MoviPrep  The instrument was then slowly withdrawn as the colon was fully examined.  COLON FINDINGS: Mild diverticulosis was noted in the sigmoid colon. The colon was otherwise normal.  There was no diverticulosis, inflammation, polyps or cancers unless previously stated. Multiple random biopsies were taken throughout the colon. Retroflexed views revealed no abnormalities. The time to cecum=5 minutes 31 seconds. Withdrawal time=8 minutes 43 seconds.  The scope was withdrawn and the procedure completed.  COMPLICATIONS: There were no complications.  ENDOSCOPIC IMPRESSION: 1.   Mild diverticulosis was noted in the sigmoid colon 2.   The colon was otherwise normal  RECOMMENDATIONS: 1.  Await pathology results 2.  High fiber diet with liberal fluid intake. 3.  Given your age, you will not need another colonoscopy for colon cancer screening or polyp surveillance.  These types of tests usually stop around the age 74.    eSigned:  Meryl Dare,  MD, [facility] 02/02/2012 10:32 AM   cc: Rodrigo Ran, MD

## 2012-02-05 ENCOUNTER — Telehealth: Payer: Self-pay | Admitting: *Deleted

## 2012-02-05 NOTE — Telephone Encounter (Signed)
  Follow up Call-  Call back number 02/02/2012  Post procedure Call Back phone  # [phone]  Permission to leave phone message Yes     Patient questions:  Do you have a fever, pain , or abdominal swelling? no Pain Score  0 *  Have you tolerated food without any problems? yes  Have you been able to return to your normal activities? yes  Do you have any questions about your discharge instructions: Diet   no Medications  no Follow up visit  no  Do you have questions or concerns about your Care? no  Actions: * If pain score is 4 or above: No action needed, pain <4.

## 2012-02-07 ENCOUNTER — Encounter: Payer: Self-pay | Admitting: Gastroenterology

## 2012-02-26 DIAGNOSIS — B373 Candidiasis of vulva and vagina: Secondary | ICD-10-CM | POA: Diagnosis not present

## 2012-02-26 DIAGNOSIS — B3731 Acute candidiasis of vulva and vagina: Secondary | ICD-10-CM | POA: Diagnosis not present

## 2012-03-08 DIAGNOSIS — D2320 Other benign neoplasm of skin of unspecified ear and external auricular canal: Secondary | ICD-10-CM | POA: Diagnosis not present

## 2012-03-08 DIAGNOSIS — D235 Other benign neoplasm of skin of trunk: Secondary | ICD-10-CM | POA: Diagnosis not present

## 2012-03-08 DIAGNOSIS — L819 Disorder of pigmentation, unspecified: Secondary | ICD-10-CM | POA: Diagnosis not present

## 2012-03-08 DIAGNOSIS — D1801 Hemangioma of skin and subcutaneous tissue: Secondary | ICD-10-CM | POA: Diagnosis not present

## 2012-04-04 ENCOUNTER — Telehealth: Payer: Self-pay | Admitting: Gynecology

## 2012-04-04 NOTE — Telephone Encounter (Signed)
Called pt and offered apologies for bad reaction to compounded cream. Inst to try coconut oil. Solid at room temp, apply to irritated areas like an ointment. Pt. Will try and call back for OV early next week if no relief.

## 2012-04-04 NOTE — Telephone Encounter (Signed)
Tell pt we are sorry that she is having this problem, she can try cocoanut oil which is a solid at room temp and is if that helps, if not offer her an office visit for early next week

## 2012-04-04 NOTE — Telephone Encounter (Signed)
Spoke with pt who tried hydrocortisone ointment compounded by Tamela Oddi at [facility]. Tried on Thursday and Friday last week and reports afterwards she felt "scalded" and worse than she was before. Pt wants [place] to add [facility] to her allergies. Pt has since used extra virgin olive oil and Replens to try to help burning. Any other advice?

## 2012-04-22 ENCOUNTER — Telehealth: Payer: Self-pay | Admitting: Nurse Practitioner

## 2012-04-22 ENCOUNTER — Telehealth: Payer: Self-pay | Admitting: *Deleted

## 2012-04-22 ENCOUNTER — Ambulatory Visit (INDEPENDENT_AMBULATORY_CARE_PROVIDER_SITE_OTHER): Payer: Medicare Other | Admitting: Nurse Practitioner

## 2012-04-22 ENCOUNTER — Encounter: Payer: Self-pay | Admitting: Nurse Practitioner

## 2012-04-22 VITALS — BP 102/70 | HR 72 | Resp 16 | Ht 61.0 in | Wt 131.0 lb

## 2012-04-22 DIAGNOSIS — B3731 Acute candidiasis of vulva and vagina: Secondary | ICD-10-CM

## 2012-04-22 DIAGNOSIS — N390 Urinary tract infection, site not specified: Secondary | ICD-10-CM

## 2012-04-22 DIAGNOSIS — R319 Hematuria, unspecified: Secondary | ICD-10-CM | POA: Diagnosis not present

## 2012-04-22 DIAGNOSIS — B373 Candidiasis of vulva and vagina: Secondary | ICD-10-CM

## 2012-04-22 LAB — POCT URINALYSIS DIPSTICK
Blood, UA: POSITIVE
Ketones, UA: NEGATIVE
Protein, UA: NEGATIVE

## 2012-04-22 MED ORDER — FLUCONAZOLE 100 MG PO TABS: 100.0000 mg | ORAL_TABLET | Freq: Every day | ORAL | Status: DC

## 2012-04-22 NOTE — Progress Notes (Deleted)
79 y.o.Married{Race/ethnicity:17218} female G2P2 with a {NUMBERS 1-20:19198} {gen duration:315003} history of the following:{symptoms; vaginitis:30830} Sexually active: {yes no:314532} Last sexual activity:{NUMBERS 1-20:19198}days ago. Pt also reports the following associated symptoms: {Sx; associated vaginitis:30832} Patient {HAS HAS NOT:18834}tried over the counter treatment with {Relief:12621} relief. Used hydrocortisone in the cream given by [facility] and had severe reaction with extreme buring and irritaion.  Then started with Replens without help Saturday was good Last night Aveeno bath.    Exam:  Ext:{EXAM; GYN AVWUJ:81191}                Vag:{Findings; vagina (ob1):14593}                Cx:  {exam; gyn cervix:30847}                Uterus:{exam; uterus:14489}                Adnexa: {exam; adnexa:12223}  Wet Prep shows:{Findings; GYN salin prep:60700}   Dx:{vaginitis type:315262}   Tx:{treatments; vaginitis:14231}

## 2012-04-22 NOTE — Telephone Encounter (Signed)
Left message that after talking with fDr. Farrel Gobble we wanted to get a baseline LFT's before starting Diflucan.  I told for patient to call us back to verify that she got the call and schedule labs.

## 2012-04-22 NOTE — Progress Notes (Signed)
Subjective:     Patient ID: [name], female   DOB: 08-Feb-1932, 80 y.o.   MRN: [id number]  HPI Comments: Patient has a chronic history of vaginitis with extreme itching and burning.  She occasional has a discharge that is clear to white.  She also has multiple allergies and most recently [facility] compounded a formulation that had cortisone in it to relieve symptoms.  She use 4 doses and now symptoms are flared.  She is quite upset that these symptoms are going on so long.  The only thing that helps to keep symptoms down is taking Diflucan. As noted allergies are to ingredients such as propylene glycol, cortisone, and lidocaine.  She is unable to use vaginal estrogens of any type.  The other thing that will give her temporary relief is sitz bath.  She has had testosterone cream in the past but has not used it in a while.  She has had vulvar biopsy at dermatologist.    Review of Systems  Constitutional: Positive for activity change.       Walking 2-3 times a week  Respiratory: Negative.   Cardiovascular: Negative.   Gastrointestinal: Negative.   Genitourinary: Positive for vaginal discharge and vaginal pain.       As noted this problem is chronic.  Musculoskeletal: Negative.   Skin: Negative.   Psychiatric/Behavioral: Negative.        Objective:   Physical Exam  Constitutional: She appears well-developed and well-nourished.  Abdominal: Soft. She exhibits no distension and no mass. There is no tenderness. There is no rebound and no guarding.  Genitourinary:    Vaginal discharge found.  Vaginal irritation externally with white thin vagina discharge.  Neurological: She is alert.  Skin: Skin is warm.  Psychiatric:  Very anxious   Wet prep: KOH +, Saline - clue cells, Ph 5.    Assessment:    Yeast Vaginitis acute and chronic Atrophic  Vaginitis - unable to use vaginal estrogen    Plan:     Will start her on Diflucan 100 mg per day # 30 I told her I would discuss  findings with Dr. Farrel Gobble - decision was made to get baseline LFT's first and repeat in 2 wk's.  Pt was called and left msg concerning this.  Reviewed, TL

## 2012-04-22 NOTE — Telephone Encounter (Signed)
Patient is no better with advise last week of vaginal dryness. Appointment given for OV with Shirlyn Goltz @ 3:30pm. sue

## 2012-04-23 ENCOUNTER — Other Ambulatory Visit: Payer: Self-pay | Admitting: Nurse Practitioner

## 2012-04-23 ENCOUNTER — Telehealth: Payer: Self-pay | Admitting: Nurse Practitioner

## 2012-04-23 ENCOUNTER — Other Ambulatory Visit (INDEPENDENT_AMBULATORY_CARE_PROVIDER_SITE_OTHER): Payer: Medicare Other

## 2012-04-23 DIAGNOSIS — Z79899 Other long term (current) drug therapy: Secondary | ICD-10-CM

## 2012-04-23 LAB — HEPATIC FUNCTION PANEL
ALT: 13 U/L (ref 0–35)
AST: 17 U/L (ref 0–37)
Albumin: 4.1 g/dL (ref 3.5–5.2)
Alkaline Phosphatase: 78 U/L (ref 39–117)
Total Protein: 6.7 g/dL (ref 6.0–8.3)

## 2012-04-23 NOTE — Telephone Encounter (Signed)
Pt was called about returning for LFT's , she was agreeable and is coming in today.  Apt. is made.

## 2012-04-24 ENCOUNTER — Telehealth: Payer: Self-pay | Admitting: Nurse Practitioner

## 2012-04-24 ENCOUNTER — Other Ambulatory Visit: Payer: Self-pay | Admitting: Nurse Practitioner

## 2012-04-24 DIAGNOSIS — Z79899 Other long term (current) drug therapy: Secondary | ICD-10-CM

## 2012-04-24 NOTE — Telephone Encounter (Signed)
Patient aware of results.

## 2012-05-08 ENCOUNTER — Other Ambulatory Visit (INDEPENDENT_AMBULATORY_CARE_PROVIDER_SITE_OTHER): Payer: Medicare Other

## 2012-05-08 DIAGNOSIS — Z79899 Other long term (current) drug therapy: Secondary | ICD-10-CM | POA: Diagnosis not present

## 2012-05-08 LAB — HEPATIC FUNCTION PANEL
ALT: 11 U/L (ref 0–35)
Albumin: 3.9 g/dL (ref 3.5–5.2)
Total Protein: 6.6 g/dL (ref 6.0–8.3)

## 2012-05-09 NOTE — Progress Notes (Signed)
Pt is aware of results and agreeable to Diflucan plans.

## 2012-05-23 ENCOUNTER — Encounter: Payer: Self-pay | Admitting: Nurse Practitioner

## 2012-05-23 ENCOUNTER — Ambulatory Visit (INDEPENDENT_AMBULATORY_CARE_PROVIDER_SITE_OTHER): Payer: Medicare Other | Admitting: Nurse Practitioner

## 2012-05-23 VITALS — BP 130/78 | HR 80 | Resp 16 | Ht 61.2 in | Wt 130.0 lb

## 2012-05-23 DIAGNOSIS — N760 Acute vaginitis: Secondary | ICD-10-CM

## 2012-05-23 MED ORDER — VALACYCLOVIR HCL 500 MG PO TABS: 500.0000 mg | ORAL_TABLET | Freq: Every day | ORAL | Status: DC

## 2012-05-23 NOTE — Progress Notes (Signed)
Subjective:     Patient ID: [name], female   DOB: Mar 04, 1932, 79 y.o.   MRN: [id number]  HPI Comments: Patient well known to [place] for chronic vulvitis and a multitude of medication allergies..  She most recently has been on Diflucan for 1 month. (she had normal LFT's before and during treatment).  Now off Diflucan since last Tuesday. Patient wold not say that she had less vaginal itching but she has done better overall with vulvar symptoms while on med's.  States in general,  uncomfortable all the time at the vulva.  Even used baking soda and Aveeno sitz bath frequently just to give her temporary relief.  She is aware that frequent baths can also cause increased dryness.  She is also taking Benadryl sometimes at HS to help with pruritis symptoms.  Still has one area on left labia that is always itchy and uncomfortable.  Thinks this is the area that labial biopsy was taken. No known history of HSV.    Review of Systems  Constitutional: Negative.  Negative for fever, fatigue and unexpected weight change.  Respiratory: Negative.   Cardiovascular: Negative.   Gastrointestinal: Negative.   Genitourinary: Positive for vaginal pain. Negative for dysuria, urgency, frequency, hematuria, flank pain, vaginal bleeding, vaginal discharge, difficulty urinating, genital sores and pelvic pain.       Labial pain and discomfort on the left.  Skin: Negative.   Neurological: Negative.   Psychiatric/Behavioral:       Some / a lot of frustration over the chronicity of these problems.  She has also been to the urologist with negative eval other than cystocele.       Objective:   Physical Exam  Constitutional: She is oriented to person, place, and time. She appears well-developed and well-nourished.  Cardiovascular: Normal rate.   Pulmonary/Chest: Effort normal.  Abdominal: Soft. She exhibits no distension and no mass. There is no tenderness. There is no rebound and no guarding.  Genitourinary:  Vulva  area without lesions and or discharge. No signs of redness or signs of infection. No vaginal discharge at all. Nothing to culture.  Neurological: She is alert and oriented to person, place, and time.  Psychiatric: Her behavior is normal. Judgment and thought content normal.  Mood is despondent and frustrated.       Assessment:     In view of lack of physical finding, maybe viral is origin of vulvar symptoms since it is always on left labia    Plan:     Consulted and discussed with Dr. Farrel Gobble who also saw patient and examined. Will try antiviral agent - Valtrex 500 mg for 10 days.  then see her back in 2 wk's.  Patient is concerned about allergies since she has multiple, but is willing to at least try this.  She is advised by Dr. Farrel Gobble to use Vit E capsule and open the end and put the liquid on this area.  She has not yet tried his but will do so.  Also available is Vit E Vaginal suppositories if that does not help.    Addendum note by Dr. Farrel Gobble

## 2012-05-27 ENCOUNTER — Ambulatory Visit: Payer: Medicare Other | Admitting: Nurse Practitioner

## 2012-06-06 ENCOUNTER — Encounter: Payer: Self-pay | Admitting: Nurse Practitioner

## 2012-06-06 ENCOUNTER — Ambulatory Visit (INDEPENDENT_AMBULATORY_CARE_PROVIDER_SITE_OTHER): Payer: Medicare Other | Admitting: Gynecology

## 2012-06-06 VITALS — BP 130/76 | HR 68 | Resp 16 | Ht 61.0 in | Wt 131.0 lb

## 2012-06-06 DIAGNOSIS — N760 Acute vaginitis: Secondary | ICD-10-CM

## 2012-06-06 DIAGNOSIS — N763 Subacute and chronic vulvitis: Secondary | ICD-10-CM

## 2012-06-06 NOTE — Progress Notes (Signed)
Subjective:     Patient ID: [name], female   DOB: 02-10-1932, 79 y.o.   MRN: [id number]  HPI Comments: Pt here as follow up, still complaining that she is getting no relief of her chronic vulvitis.  Most recently pt was treated with valtrex for questionable zoster or undiagnosed HSV infection as well as vitE oil.   We reviewed all of her recent trials-vagifem, hydrocortisone, vaginal suppositories, lidocaine jelly, diflucan but she fails to improve.  Pt reports that she uses scent free/dye free laundry detergent, sensitive toilet paper, she wears synthetic underwear, she does not use soap in the genital area.  She reports her only relief is to soak in a bath. Pt had a vulvar biopsy done within the year-subacute dermatitis with allergic component. Pt expresses level of frustration as this has been going on for approximately 1y    Review of Systems  All other systems reviewed and are negative.       Objective:   Physical Exam  Constitutional: She is oriented to person, place, and time. She appears well-developed and well-nourished.  Genitourinary:     Neurological: She is alert and oriented to person, place, and time.  vagina-pale, smooth with menopausal changes      Assessment:     Nonspecific vulvitis     Plan:     Further discussion with pt reveals that she has exaggerated responses to medications and she can wheal if she scratches repetitively. she had seen immunology in the past at [facility] and was skin tested, doesn't recall the results.  I suggest we refer back for testing,  Prefer to not apply topicals to labia at this time except she could continue the vit E.  She was instructed to use aveeno, balneol cotton underwear and we will refer back to immunology Length of visit-80m >50% reviewing complex history

## 2012-06-06 NOTE — Patient Instructions (Signed)
aveeno pour over vulva when irritated, dry with blower on cool setting, cotton only underwear We will refer to immunology, will call with information Continue to use vitamin E  balneol otc- may be behind pharmacy, check if contains propylene glycol Benadryl 25mg  nightly

## 2012-06-13 ENCOUNTER — Telehealth: Payer: Self-pay | Admitting: Gynecology

## 2012-06-13 NOTE — Telephone Encounter (Signed)
Spoke with pt about irritation in vaginal area. Pt has been using aveeno sitz baths a few times a day with some relief. Pt pours solution over herself rather than getting into tub. Pt planning to see PCP soon for physical and wants to hold off on seeing allergist until after talking to PCP. Pt will call back with further needs.

## 2012-06-13 NOTE — Telephone Encounter (Signed)
Pt. Called and ask to be called back concerning her allergies. She said she doesn't want an appointment made with the Allergist until she has seen her Primary caregiver and discussed it with them. But, would like for the Triage nurse to call her about the allergy issues.[name]

## 2012-06-28 DIAGNOSIS — R82998 Other abnormal findings in urine: Secondary | ICD-10-CM | POA: Diagnosis not present

## 2012-06-28 DIAGNOSIS — Z79899 Other long term (current) drug therapy: Secondary | ICD-10-CM | POA: Diagnosis not present

## 2012-06-28 DIAGNOSIS — E785 Hyperlipidemia, unspecified: Secondary | ICD-10-CM | POA: Diagnosis not present

## 2012-06-28 DIAGNOSIS — E559 Vitamin D deficiency, unspecified: Secondary | ICD-10-CM | POA: Diagnosis not present

## 2012-07-05 ENCOUNTER — Other Ambulatory Visit: Payer: Self-pay | Admitting: Dermatology

## 2012-07-05 DIAGNOSIS — K529 Noninfective gastroenteritis and colitis, unspecified: Secondary | ICD-10-CM | POA: Diagnosis not present

## 2012-07-05 DIAGNOSIS — M542 Cervicalgia: Secondary | ICD-10-CM | POA: Diagnosis not present

## 2012-07-05 DIAGNOSIS — Z0000 Encounter for general adult medical examination without abnormal findings: Secondary | ICD-10-CM | POA: Diagnosis not present

## 2012-07-05 DIAGNOSIS — H8110 Benign paroxysmal vertigo, unspecified ear: Secondary | ICD-10-CM | POA: Diagnosis not present

## 2012-07-05 DIAGNOSIS — Z1331 Encounter for screening for depression: Secondary | ICD-10-CM | POA: Diagnosis not present

## 2012-07-05 DIAGNOSIS — R5383 Other fatigue: Secondary | ICD-10-CM | POA: Diagnosis not present

## 2012-07-05 DIAGNOSIS — D485 Neoplasm of uncertain behavior of skin: Secondary | ICD-10-CM | POA: Diagnosis not present

## 2012-07-05 DIAGNOSIS — R350 Frequency of micturition: Secondary | ICD-10-CM | POA: Diagnosis not present

## 2012-07-05 DIAGNOSIS — Z85828 Personal history of other malignant neoplasm of skin: Secondary | ICD-10-CM | POA: Diagnosis not present

## 2012-07-05 DIAGNOSIS — D045 Carcinoma in situ of skin of trunk: Secondary | ICD-10-CM | POA: Diagnosis not present

## 2012-07-05 DIAGNOSIS — M949 Disorder of cartilage, unspecified: Secondary | ICD-10-CM | POA: Diagnosis not present

## 2012-07-05 DIAGNOSIS — C44319 Basal cell carcinoma of skin of other parts of face: Secondary | ICD-10-CM | POA: Diagnosis not present

## 2012-07-05 DIAGNOSIS — R5381 Other malaise: Secondary | ICD-10-CM | POA: Diagnosis not present

## 2012-07-05 DIAGNOSIS — M899 Disorder of bone, unspecified: Secondary | ICD-10-CM | POA: Diagnosis not present

## 2012-07-05 DIAGNOSIS — R3129 Other microscopic hematuria: Secondary | ICD-10-CM | POA: Diagnosis not present

## 2012-07-05 DIAGNOSIS — C44621 Squamous cell carcinoma of skin of unspecified upper limb, including shoulder: Secondary | ICD-10-CM | POA: Diagnosis not present

## 2012-07-10 DIAGNOSIS — Z1212 Encounter for screening for malignant neoplasm of rectum: Secondary | ICD-10-CM | POA: Diagnosis not present

## 2012-07-12 DIAGNOSIS — H2510 Age-related nuclear cataract, unspecified eye: Secondary | ICD-10-CM | POA: Diagnosis not present

## 2012-07-12 DIAGNOSIS — H11009 Unspecified pterygium of unspecified eye: Secondary | ICD-10-CM | POA: Diagnosis not present

## 2012-07-12 DIAGNOSIS — H5710 Ocular pain, unspecified eye: Secondary | ICD-10-CM | POA: Diagnosis not present

## 2012-07-12 DIAGNOSIS — H52209 Unspecified astigmatism, unspecified eye: Secondary | ICD-10-CM | POA: Diagnosis not present

## 2012-07-15 ENCOUNTER — Telehealth: Payer: Self-pay | Admitting: Orthopedic Surgery

## 2012-07-15 NOTE — Telephone Encounter (Signed)
Spoke with pt for update. Pt is doing well. No itching or burning for 3 weeks now. Pt saw PCP and they told her to switch to all cotton underwear and no more tub baths. Pt is taking showers and just using Aveeno bath to pour over herself on the toilet. Pt tried Benadryl for about 5 nights, but it "messed with my head too much" so pt stopped taking it. Pt is content right now and doesn't think she needs immunology referral currently. Pt will call back if anything changes.

## 2012-07-29 DIAGNOSIS — F411 Generalized anxiety disorder: Secondary | ICD-10-CM | POA: Diagnosis not present

## 2012-07-29 DIAGNOSIS — K589 Irritable bowel syndrome without diarrhea: Secondary | ICD-10-CM | POA: Diagnosis not present

## 2012-07-29 DIAGNOSIS — I4949 Other premature depolarization: Secondary | ICD-10-CM | POA: Diagnosis not present

## 2012-07-29 DIAGNOSIS — IMO0002 Reserved for concepts with insufficient information to code with codable children: Secondary | ICD-10-CM | POA: Diagnosis not present

## 2012-07-29 DIAGNOSIS — K219 Gastro-esophageal reflux disease without esophagitis: Secondary | ICD-10-CM | POA: Diagnosis not present

## 2012-08-01 DIAGNOSIS — Z85828 Personal history of other malignant neoplasm of skin: Secondary | ICD-10-CM | POA: Diagnosis not present

## 2012-08-01 DIAGNOSIS — C44621 Squamous cell carcinoma of skin of unspecified upper limb, including shoulder: Secondary | ICD-10-CM | POA: Diagnosis not present

## 2012-08-01 DIAGNOSIS — L821 Other seborrheic keratosis: Secondary | ICD-10-CM | POA: Diagnosis not present

## 2012-08-12 DIAGNOSIS — Z85828 Personal history of other malignant neoplasm of skin: Secondary | ICD-10-CM | POA: Diagnosis not present

## 2012-08-12 DIAGNOSIS — C44319 Basal cell carcinoma of skin of other parts of face: Secondary | ICD-10-CM | POA: Diagnosis not present

## 2012-08-19 ENCOUNTER — Telehealth: Payer: Self-pay | Admitting: Gynecology

## 2012-08-19 NOTE — Telephone Encounter (Signed)
Send her 1 more diflcuan  And if not better ov

## 2012-08-19 NOTE — Telephone Encounter (Signed)
Patient was given  Diflucan for  yeast infection after surgery. She is still needing another dose.

## 2012-08-19 NOTE — Telephone Encounter (Signed)
Patient last OV with Dr. Farrel Gobble 06/06/2012 subacute and chronic vulvitis. Patient had nose surgery last week and was put on antibiotic. Patient did not think about doing probiotic prior to surgery. Doctor gave her Diflucan that she took on Thursday last week. Stated she took 2nd diflucan yesterday. Continuing to have itching, burning,irritation. Patient has been letting air get to area yesterday, wearing white cotton panties., stated she had to take one time 2 weeks of diflucan. Please advise.

## 2012-08-20 ENCOUNTER — Other Ambulatory Visit: Payer: Self-pay | Admitting: Orthopedic Surgery

## 2012-08-20 DIAGNOSIS — B3731 Acute candidiasis of vulva and vagina: Secondary | ICD-10-CM

## 2012-08-20 DIAGNOSIS — B373 Candidiasis of vulva and vagina: Secondary | ICD-10-CM

## 2012-08-20 MED ORDER — FLUCONAZOLE 150 MG PO TABS: 150.0000 mg | ORAL_TABLET | Freq: Every day | ORAL | Status: DC

## 2012-08-20 NOTE — Telephone Encounter (Signed)
Spoke with pt to inform we will call in one more diflucan tab for her, and if this doesn't resolve her symptoms, she should call back for an appt. Pt took diflucan 150 mg as given by her dermatologist last week x 2 doses. Will send to [facility] in [place]. Pt agreeable.

## 2012-08-20 NOTE — Telephone Encounter (Signed)
Spoke with pt about one more diflucan to be sent to her pharmacy. Pt to call back for appt if this does not resolve symptoms. Pt took diflucan 150 mg x 2 doses, given to her by her dermatologist last week.

## 2012-09-06 DIAGNOSIS — D485 Neoplasm of uncertain behavior of skin: Secondary | ICD-10-CM | POA: Diagnosis not present

## 2012-09-06 DIAGNOSIS — Z85828 Personal history of other malignant neoplasm of skin: Secondary | ICD-10-CM | POA: Diagnosis not present

## 2012-10-09 ENCOUNTER — Other Ambulatory Visit: Payer: Self-pay | Admitting: Dermatology

## 2012-10-09 DIAGNOSIS — D0460 Carcinoma in situ of skin of unspecified upper limb, including shoulder: Secondary | ICD-10-CM | POA: Diagnosis not present

## 2012-10-09 DIAGNOSIS — Z85828 Personal history of other malignant neoplasm of skin: Secondary | ICD-10-CM | POA: Diagnosis not present

## 2012-10-09 DIAGNOSIS — D485 Neoplasm of uncertain behavior of skin: Secondary | ICD-10-CM | POA: Diagnosis not present

## 2012-10-15 DIAGNOSIS — K219 Gastro-esophageal reflux disease without esophagitis: Secondary | ICD-10-CM | POA: Diagnosis not present

## 2012-10-15 DIAGNOSIS — F411 Generalized anxiety disorder: Secondary | ICD-10-CM | POA: Diagnosis not present

## 2012-10-15 DIAGNOSIS — M899 Disorder of bone, unspecified: Secondary | ICD-10-CM | POA: Diagnosis not present

## 2012-10-15 DIAGNOSIS — E785 Hyperlipidemia, unspecified: Secondary | ICD-10-CM | POA: Diagnosis not present

## 2012-10-15 DIAGNOSIS — K589 Irritable bowel syndrome without diarrhea: Secondary | ICD-10-CM | POA: Diagnosis not present

## 2012-11-21 DIAGNOSIS — Z1231 Encounter for screening mammogram for malignant neoplasm of breast: Secondary | ICD-10-CM | POA: Diagnosis not present

## 2012-11-21 DIAGNOSIS — Z803 Family history of malignant neoplasm of breast: Secondary | ICD-10-CM | POA: Diagnosis not present

## 2012-12-09 ENCOUNTER — Ambulatory Visit (INDEPENDENT_AMBULATORY_CARE_PROVIDER_SITE_OTHER): Payer: Medicare Other | Admitting: Gynecology

## 2012-12-09 ENCOUNTER — Encounter: Payer: Self-pay | Admitting: Gynecology

## 2012-12-09 VITALS — BP 126/80 | HR 60 | Resp 12 | Ht 60.5 in | Wt 130.0 lb

## 2012-12-09 DIAGNOSIS — Z124 Encounter for screening for malignant neoplasm of cervix: Secondary | ICD-10-CM

## 2012-12-09 DIAGNOSIS — N760 Acute vaginitis: Secondary | ICD-10-CM | POA: Diagnosis not present

## 2012-12-09 DIAGNOSIS — N763 Subacute and chronic vulvitis: Secondary | ICD-10-CM

## 2012-12-09 DIAGNOSIS — Z01419 Encounter for gynecological examination (general) (routine) without abnormal findings: Secondary | ICD-10-CM | POA: Diagnosis not present

## 2012-12-09 NOTE — Progress Notes (Signed)
79 y.o. Married Caucasian female   G2P2 here for annual exam.She does not report post-menopasual bleeding.  Pt has a history of chronic vulvitis that finally responded to aveeno bath, cotton underwear, did well until had a yeast infection as a result of sinus infection.  Pt also reports some fecal incontinence after bowel movements, can have diarrhea with irritable bowel, cleans in shower after moves bowels  Patient's last menstrual period was 01/17/1988.          Sexually active: no  The current method of family planning is post menopausal status.    Exercising: yes  Walking 3x/wk Last pap: 11/29/09 Negative  Abnormal PAP:  no Mammogram: 11/27/12 Bi-Rads 2 BSE: not much Colonoscopy: 02/02/12 DEXA: 2008 Alcohol: no Tobacco: no  Hgb: PCP ; Urine: PCP  Health Maintenance  Topic Date Due  . Tetanus/tdap  09/19/1953  . Zostavax  09/20/1994  . Pneumococcal Polysaccharide Vaccine Age 14 And Over  09/20/1999  . Influenza Vaccine  08/16/2012  . Colonoscopy  02/01/2022    Family History  Problem Relation Age of Onset  . Cancer Other   . Diabetes Other   . CAD Brother   . Leukemia Father   . Pancreatic cancer Brother   . Colon cancer Neg Hx     Patient Active Problem List   Diagnosis Date Noted  . Acute colitis 01/06/2012  . Hematochezia 01/06/2012  . Dehydration 01/06/2012  . IBS 12/31/2007  . BLOOD IN STOOL, OCCULT 12/31/2007  . CARCINOMA, BASAL CELL, SKIN 02/22/2007  . HYPERLIPIDEMIA 02/22/2007  . GASTROESOPHAGEAL REFLUX DISEASE 02/22/2007  . FRACTURE, FOOT 02/22/2007  . CHOLECYSTECTOMY, HX OF 02/22/2007    Past Medical History  Diagnosis Date  . Diverticulitis   . Diverticulosis   . GERD (gastroesophageal reflux disease)   . Hypertension   . Esophageal stricture   . Benign neoplasm stomach body   . Internal hemorrhoids   . Hepatic steatosis   . Colitis   . Hyperlipidemia   . CTS (carpal tunnel syndrome)   . AR (allergic rhinitis)   . IBS (irritable bowel  syndrome)   . Anxiety   . Skin cancer     Past Surgical History  Procedure Laterality Date  . Cholecystectomy      Allergies: Bacitracin-polymyxin b; Doxepin hcl; Erythromycin; Hydrocortisone; Levofloxacin; Lidocaine; Marcaine; Naproxen sodium; Penicillins; Pseudoephedrine; Sulfonamide derivatives; and Cortisone  Current Outpatient Prescriptions  Medication Sig Dispense Refill  . ALPRAZolam (XANAX) 1 MG tablet Take 0.5 tablets (0.5 mg total) by mouth at bedtime as needed. For sleep  30 tablet  0  . dicyclomine (BENTYL) 10 MG capsule Take 10 mg by mouth 3 (three) times daily as needed. For stomach cramping      . ezetimibe (ZETIA) 10 MG tablet Take 10 mg by mouth every Monday, Wednesday, and Friday. ON HOLD      . meclizine (ANTIVERT) 25 MG tablet Take 25 mg by mouth 3 (three) times daily as needed. For vertigo      . omeprazole (PRILOSEC) 20 MG capsule Take 20 mg by mouth daily.      . Probiotic Product (SOLUBLE FIBER/PROBIOTICS PO) Take by mouth.      . rosuvastatin (CRESTOR) 5 MG tablet Take 2.5 mg by mouth daily. ON HOLD      . Vitamin D, Ergocalciferol, (DRISDOL) 50000 UNITS CAPS Take 50,000 Units by mouth every 7 (seven) days. sunday      . ALREX 0.2 % SUSP daily.      Marland Kitchen  butalbital-acetaminophen-caffeine (FIORICET, ESGIC) 50-325-40 MG per tablet Take 1 tablet by mouth 2 (two) times daily as needed.       No current facility-administered medications for this visit.    ROS: Pertinent items are noted in HPI.  Exam:    Ht 5' 0.5" (1.537 m)  Wt 130 lb (58.968 kg)  BMI 24.96 kg/m2  LMP 01/17/1988 Weight change: @WEIGHTCHANGE @ Last 3 height recordings:  Ht Readings from Last 3 Encounters:  12/09/12 5' 0.5" (1.537 m)  06/06/12 5\' 1"  (1.549 m)  05/23/12 5' 1.2" (1.554 m)   General appearance: alert, cooperative and appears stated age Head: Normocephalic, without obvious abnormality, atraumatic Neck: no adenopathy, no carotid bruit, no JVD, supple, symmetrical, trachea midline  and thyroid not enlarged, symmetric, no tenderness/mass/nodules Lungs: clear to auscultation bilaterally Breasts: normal appearance, no masses or tenderness Heart: regular rate and rhythm, S1, S2 normal, no murmur, click, rub or gallop Abdomen: soft, non-tender; bowel sounds normal; no masses,  no organomegaly Extremities: extremities normal, atraumatic, no cyanosis or edema Skin: Skin color, texture, turgor normal. No rashes or lesions Lymph nodes: Cervical, supraclavicular, and axillary nodes normal. no inguinal nodes palpated Neurologic: Grossly normal   Pelvic: External genitalia:  no lesions              Urethra: normal appearing urethra with no masses, tenderness or lesions              Bartholins and Skenes: normal                 Vagina: atrophic              Cervix: atrophic              Pap taken: no        Bimanual Exam:  Uterus:  Atrophic                                      Adnexa:    no masses                                      Rectovaginal: Confirms                                      Anus:  normal sphincter tone, no lesions  A: well woman Chronic vulvitis-doing well     P: mammogram annual pap smear not indicated Genital hygiene discussed return annually or prn Discussed PAP guideline changes, importance of weight bearing exercises, calcium, vit D and balanced diet.  An After Visit Summary was printed and given to the patient.

## 2012-12-09 NOTE — Patient Instructions (Addendum)

## 2013-02-20 DIAGNOSIS — H532 Diplopia: Secondary | ICD-10-CM | POA: Diagnosis not present

## 2013-02-21 DIAGNOSIS — IMO0002 Reserved for concepts with insufficient information to code with codable children: Secondary | ICD-10-CM | POA: Diagnosis not present

## 2013-02-21 DIAGNOSIS — J019 Acute sinusitis, unspecified: Secondary | ICD-10-CM | POA: Diagnosis not present

## 2013-03-06 DIAGNOSIS — IMO0002 Reserved for concepts with insufficient information to code with codable children: Secondary | ICD-10-CM | POA: Diagnosis not present

## 2013-03-06 DIAGNOSIS — K589 Irritable bowel syndrome without diarrhea: Secondary | ICD-10-CM | POA: Diagnosis not present

## 2013-03-06 DIAGNOSIS — H8110 Benign paroxysmal vertigo, unspecified ear: Secondary | ICD-10-CM | POA: Diagnosis not present

## 2013-03-06 DIAGNOSIS — J019 Acute sinusitis, unspecified: Secondary | ICD-10-CM | POA: Diagnosis not present

## 2013-03-06 DIAGNOSIS — Z1331 Encounter for screening for depression: Secondary | ICD-10-CM | POA: Diagnosis not present

## 2013-03-12 DIAGNOSIS — L253 Unspecified contact dermatitis due to other chemical products: Secondary | ICD-10-CM | POA: Diagnosis not present

## 2013-03-12 DIAGNOSIS — L819 Disorder of pigmentation, unspecified: Secondary | ICD-10-CM | POA: Diagnosis not present

## 2013-03-12 DIAGNOSIS — L570 Actinic keratosis: Secondary | ICD-10-CM | POA: Diagnosis not present

## 2013-03-12 DIAGNOSIS — L723 Sebaceous cyst: Secondary | ICD-10-CM | POA: Diagnosis not present

## 2013-03-12 DIAGNOSIS — D239 Other benign neoplasm of skin, unspecified: Secondary | ICD-10-CM | POA: Diagnosis not present

## 2013-03-12 DIAGNOSIS — Z85828 Personal history of other malignant neoplasm of skin: Secondary | ICD-10-CM | POA: Diagnosis not present

## 2013-03-12 DIAGNOSIS — L821 Other seborrheic keratosis: Secondary | ICD-10-CM | POA: Diagnosis not present

## 2013-03-12 DIAGNOSIS — D1801 Hemangioma of skin and subcutaneous tissue: Secondary | ICD-10-CM | POA: Diagnosis not present

## 2013-05-15 DIAGNOSIS — L570 Actinic keratosis: Secondary | ICD-10-CM | POA: Diagnosis not present

## 2013-05-15 DIAGNOSIS — L821 Other seborrheic keratosis: Secondary | ICD-10-CM | POA: Diagnosis not present

## 2013-05-15 DIAGNOSIS — Z85828 Personal history of other malignant neoplasm of skin: Secondary | ICD-10-CM | POA: Diagnosis not present

## 2013-05-15 DIAGNOSIS — L819 Disorder of pigmentation, unspecified: Secondary | ICD-10-CM | POA: Diagnosis not present

## 2013-07-15 DIAGNOSIS — H52209 Unspecified astigmatism, unspecified eye: Secondary | ICD-10-CM | POA: Diagnosis not present

## 2013-07-15 DIAGNOSIS — H2510 Age-related nuclear cataract, unspecified eye: Secondary | ICD-10-CM | POA: Diagnosis not present

## 2013-07-17 DIAGNOSIS — Z85828 Personal history of other malignant neoplasm of skin: Secondary | ICD-10-CM | POA: Diagnosis not present

## 2013-07-17 DIAGNOSIS — L739 Follicular disorder, unspecified: Secondary | ICD-10-CM | POA: Diagnosis not present

## 2013-07-17 DIAGNOSIS — L819 Disorder of pigmentation, unspecified: Secondary | ICD-10-CM | POA: Diagnosis not present

## 2013-07-29 ENCOUNTER — Ambulatory Visit (INDEPENDENT_AMBULATORY_CARE_PROVIDER_SITE_OTHER): Payer: Medicare Other | Admitting: Nurse Practitioner

## 2013-07-29 ENCOUNTER — Ambulatory Visit: Payer: Medicare Other | Admitting: Nurse Practitioner

## 2013-07-29 ENCOUNTER — Encounter: Payer: Self-pay | Admitting: Nurse Practitioner

## 2013-07-29 VITALS — BP 110/68 | HR 76 | Ht 61.0 in | Wt 130.0 lb

## 2013-07-29 DIAGNOSIS — R300 Dysuria: Secondary | ICD-10-CM | POA: Diagnosis not present

## 2013-07-29 DIAGNOSIS — B3731 Acute candidiasis of vulva and vagina: Secondary | ICD-10-CM | POA: Diagnosis not present

## 2013-07-29 DIAGNOSIS — B373 Candidiasis of vulva and vagina: Secondary | ICD-10-CM | POA: Diagnosis not present

## 2013-07-29 LAB — POCT URINALYSIS DIPSTICK
Bilirubin, UA: NEGATIVE
GLUCOSE UA: NEGATIVE
KETONES UA: NEGATIVE
Nitrite, UA: NEGATIVE
Protein, UA: NEGATIVE
Urobilinogen, UA: NEGATIVE
pH, UA: 6

## 2013-07-29 MED ORDER — FLUCONAZOLE 150 MG PO TABS: ORAL_TABLET | ORAL | Status: DC

## 2013-07-29 NOTE — Patient Instructions (Signed)

## 2013-07-29 NOTE — Progress Notes (Signed)
Subjective:     Patient ID: [name], female   DOB: [birth year], 80 y.o.   MRN: [id number]  HPI  This 80 yo WMF complains of vaginal irritation that seemed to have started after last antibiotic treatment for URI in February.  She did complete 2 tablets of Diflucan after the med's.  But since then gradually noted an increase in symptoms of itching and  Burning externally. Tries to clean very well after a BM as she felt like that aggravated her symptoms.  She did do well in the past with taking Diflucan for a month. Only slight discharge if any.  Denies any urinary symptoms,   Review of Systems  Constitutional: Negative for fever, chills and fatigue.  Gastrointestinal: Negative.  Negative for nausea, vomiting, abdominal pain, diarrhea, constipation, abdominal distention and anal bleeding.  Genitourinary: Positive for vaginal discharge. Negative for dysuria, urgency, frequency, pelvic pain and dyspareunia.  Musculoskeletal: Negative.   Skin: Negative.   Neurological: Negative.   Psychiatric/Behavioral: Negative.        Objective:   Physical Exam  Constitutional: She is oriented to person, place, and time. She appears well-developed and well-nourished. No distress.  Abdominal: Soft. She exhibits no distension. There is no tenderness. There is no rebound.  Genitourinary:  External irritation and slight redness around labia consistent wit yeast.  Very minimal vaginal discharge.  Wet prep: Ph: 4.5; NSS: no clue cells; KOH: + yeast.  Neurological: She is alert and oriented to person, place, and time.  Skin: Skin is warm and dry.  Psychiatric: She has a normal mood and affect. Her behavior is normal. Judgment and thought content normal.       Assessment:      Vulvar dermatitis related to acute and chronic yeast   Multiple medication allergies R/O UTI Plan:     Diflucan 150 mg weekly X 4 Call back if symptoms persist Follow with urine culture

## 2013-07-30 LAB — URINE CULTURE
COLONY COUNT: NO GROWTH
Organism ID, Bacteria: NO GROWTH

## 2013-07-31 NOTE — Progress Notes (Signed)
Note reviewed, agree with plan.  Collier Monica, MD  

## 2013-08-05 DIAGNOSIS — E559 Vitamin D deficiency, unspecified: Secondary | ICD-10-CM | POA: Diagnosis not present

## 2013-08-05 DIAGNOSIS — R82998 Other abnormal findings in urine: Secondary | ICD-10-CM | POA: Diagnosis not present

## 2013-08-05 DIAGNOSIS — E785 Hyperlipidemia, unspecified: Secondary | ICD-10-CM | POA: Diagnosis not present

## 2013-08-05 DIAGNOSIS — Z79899 Other long term (current) drug therapy: Secondary | ICD-10-CM | POA: Diagnosis not present

## 2013-08-14 DIAGNOSIS — M899 Disorder of bone, unspecified: Secondary | ICD-10-CM | POA: Diagnosis not present

## 2013-08-14 DIAGNOSIS — H8110 Benign paroxysmal vertigo, unspecified ear: Secondary | ICD-10-CM | POA: Diagnosis not present

## 2013-08-14 DIAGNOSIS — Z1212 Encounter for screening for malignant neoplasm of rectum: Secondary | ICD-10-CM | POA: Diagnosis not present

## 2013-08-14 DIAGNOSIS — Z1331 Encounter for screening for depression: Secondary | ICD-10-CM | POA: Diagnosis not present

## 2013-08-14 DIAGNOSIS — F411 Generalized anxiety disorder: Secondary | ICD-10-CM | POA: Diagnosis not present

## 2013-08-14 DIAGNOSIS — R350 Frequency of micturition: Secondary | ICD-10-CM | POA: Diagnosis not present

## 2013-08-14 DIAGNOSIS — Z0000 Encounter for general adult medical examination without abnormal findings: Secondary | ICD-10-CM | POA: Diagnosis not present

## 2013-08-14 DIAGNOSIS — S139XXA Sprain of joints and ligaments of unspecified parts of neck, initial encounter: Secondary | ICD-10-CM | POA: Diagnosis not present

## 2013-08-14 DIAGNOSIS — IMO0002 Reserved for concepts with insufficient information to code with codable children: Secondary | ICD-10-CM | POA: Diagnosis not present

## 2013-08-19 DIAGNOSIS — M949 Disorder of cartilage, unspecified: Secondary | ICD-10-CM | POA: Diagnosis not present

## 2013-08-19 DIAGNOSIS — M899 Disorder of bone, unspecified: Secondary | ICD-10-CM | POA: Diagnosis not present

## 2013-08-27 DIAGNOSIS — L509 Urticaria, unspecified: Secondary | ICD-10-CM | POA: Diagnosis not present

## 2013-08-27 DIAGNOSIS — Z85828 Personal history of other malignant neoplasm of skin: Secondary | ICD-10-CM | POA: Diagnosis not present

## 2013-10-10 ENCOUNTER — Other Ambulatory Visit: Payer: Self-pay | Admitting: Dermatology

## 2013-10-10 DIAGNOSIS — Z79899 Other long term (current) drug therapy: Secondary | ICD-10-CM | POA: Diagnosis not present

## 2013-10-10 DIAGNOSIS — Z85828 Personal history of other malignant neoplasm of skin: Secondary | ICD-10-CM | POA: Diagnosis not present

## 2013-10-10 DIAGNOSIS — L509 Urticaria, unspecified: Secondary | ICD-10-CM | POA: Diagnosis not present

## 2013-11-17 ENCOUNTER — Encounter: Payer: Self-pay | Admitting: Nurse Practitioner

## 2014-02-03 DIAGNOSIS — Z1231 Encounter for screening mammogram for malignant neoplasm of breast: Secondary | ICD-10-CM | POA: Diagnosis not present

## 2014-02-03 DIAGNOSIS — Z803 Family history of malignant neoplasm of breast: Secondary | ICD-10-CM | POA: Diagnosis not present

## 2014-03-16 DIAGNOSIS — L509 Urticaria, unspecified: Secondary | ICD-10-CM | POA: Diagnosis not present

## 2014-03-16 DIAGNOSIS — K589 Irritable bowel syndrome without diarrhea: Secondary | ICD-10-CM | POA: Diagnosis not present

## 2014-03-16 DIAGNOSIS — R51 Headache: Secondary | ICD-10-CM | POA: Diagnosis not present

## 2014-03-16 DIAGNOSIS — Z6824 Body mass index (BMI) 24.0-24.9, adult: Secondary | ICD-10-CM | POA: Diagnosis not present

## 2014-03-17 DIAGNOSIS — D539 Nutritional anemia, unspecified: Secondary | ICD-10-CM | POA: Diagnosis not present

## 2014-05-07 DIAGNOSIS — D1801 Hemangioma of skin and subcutaneous tissue: Secondary | ICD-10-CM | POA: Diagnosis not present

## 2014-05-07 DIAGNOSIS — D225 Melanocytic nevi of trunk: Secondary | ICD-10-CM | POA: Diagnosis not present

## 2014-05-07 DIAGNOSIS — L2089 Other atopic dermatitis: Secondary | ICD-10-CM | POA: Diagnosis not present

## 2014-05-07 DIAGNOSIS — L814 Other melanin hyperpigmentation: Secondary | ICD-10-CM | POA: Diagnosis not present

## 2014-05-07 DIAGNOSIS — L509 Urticaria, unspecified: Secondary | ICD-10-CM | POA: Diagnosis not present

## 2014-05-07 DIAGNOSIS — Z85828 Personal history of other malignant neoplasm of skin: Secondary | ICD-10-CM | POA: Diagnosis not present

## 2014-05-07 DIAGNOSIS — L821 Other seborrheic keratosis: Secondary | ICD-10-CM | POA: Diagnosis not present

## 2014-05-07 DIAGNOSIS — L570 Actinic keratosis: Secondary | ICD-10-CM | POA: Diagnosis not present

## 2014-05-18 ENCOUNTER — Telehealth: Payer: Self-pay | Admitting: Internal Medicine

## 2014-05-18 NOTE — Telephone Encounter (Signed)
Estill Bamberg called back.  Patient needs seen for her severe diarrhea and not anemia.  She was treated with flagyl and is still having severe diarrhea, bloating, abdominal pain, and weakness.  She will come in and see Nicoletta Ba PA on 05/22/14 2:00.  Estill Bamberg will fax notes.

## 2014-05-18 NOTE — Telephone Encounter (Signed)
I spoke with [name] at [facility].  She reports that Dr. Joylene Draft would like her seen urgently for her macrocytic anemia.  I asked for more details. She reports that the patient called this am to report that she was no better with her IBS.  I asked was she being referred for IBS or anemia? She will have someone call me back.  The patient has not been seen there since 03/16/14.  I asked that she fax copies of her office notes and lab work until someone calls me back.

## 2014-05-22 ENCOUNTER — Encounter: Payer: Self-pay | Admitting: Physician Assistant

## 2014-05-22 ENCOUNTER — Ambulatory Visit (INDEPENDENT_AMBULATORY_CARE_PROVIDER_SITE_OTHER): Payer: Medicare Other | Admitting: Physician Assistant

## 2014-05-22 ENCOUNTER — Other Ambulatory Visit (INDEPENDENT_AMBULATORY_CARE_PROVIDER_SITE_OTHER): Payer: Medicare Other

## 2014-05-22 VITALS — BP 118/78 | HR 78 | Ht 61.0 in | Wt 132.4 lb

## 2014-05-22 DIAGNOSIS — R1032 Left lower quadrant pain: Secondary | ICD-10-CM

## 2014-05-22 DIAGNOSIS — R197 Diarrhea, unspecified: Secondary | ICD-10-CM

## 2014-05-22 DIAGNOSIS — R1031 Right lower quadrant pain: Secondary | ICD-10-CM

## 2014-05-22 DIAGNOSIS — K589 Irritable bowel syndrome without diarrhea: Secondary | ICD-10-CM

## 2014-05-22 LAB — SEDIMENTATION RATE: Sed Rate: 18 mm/hr (ref 0–22)

## 2014-05-22 LAB — COMPREHENSIVE METABOLIC PANEL
ALBUMIN: 4.2 g/dL (ref 3.5–5.2)
ALT: 9 U/L (ref 0–35)
AST: 14 U/L (ref 0–37)
Alkaline Phosphatase: 110 U/L (ref 39–117)
BILIRUBIN TOTAL: 0.4 mg/dL (ref 0.2–1.2)
BUN: 8 mg/dL (ref 6–23)
CO2: 32 mEq/L (ref 19–32)
Calcium: 10.7 mg/dL — ABNORMAL HIGH (ref 8.4–10.5)
Chloride: 101 mEq/L (ref 96–112)
Creatinine, Ser: 0.8 mg/dL (ref 0.40–1.20)
GFR: 73.42 mL/min (ref >60.00)
GLUCOSE: 114 mg/dL — AB (ref 70–99)
POTASSIUM: 5 meq/L (ref 3.5–5.1)
SODIUM: 136 meq/L (ref 135–145)
TOTAL PROTEIN: 7.9 g/dL (ref 6.0–8.3)

## 2014-05-22 LAB — C-REACTIVE PROTEIN: CRP: 0.1 mg/dL — ABNORMAL LOW (ref 0.5–20.0)

## 2014-05-22 NOTE — Patient Instructions (Addendum)
Please go to the basement level to have your labs drawn.  Take the Bentyl ( dicyclomine) 10 mg, take 3 x daily on dailys you have diarrhea. Take a daily probioric, like Culturelle w/ FLorastor.   You have been scheduled for a CT scan of the abdomen and pelvis at [facility] ([address]---this is in the same building as Press [facility]).   You are scheduled on  Wed 05-27-2014 at 3:30 PM . You should arrive at 3:15 minutes prior to your appointment time for registration. Please follow the written instructions below on the day of your exam:  WARNING: IF YOU ARE ALLERGIC TO IODINE/X-RAY DYE, PLEASE NOTIFY RADIOLOGY IMMEDIATELY AT [phone]! YOU WILL BE GIVEN A 13 HOUR PREMEDICATION PREP.  1) Do not eat or drink anything after  11;30 am  (4 hours prior to your test) 2) You have been given 2 bottles of oral contrast to drink. The solution may taste better if refrigerated, but do NOT add ice or any other liquid to this solution. Shake  well before drinking.    Drink 1 bottle of contrast @  1;30 PM (2 hours prior to your exam)  Drink 1 bottle of contrast @ 2:30 pm  (1 hour prior to your exam)  You may take any medications as prescribed with a small amount of water except for the following: Metformin, Glucophage, Glucovance, Avandamet, Riomet, Fortamet, Actoplus Met, Janumet, Glumetza or Metaglip. The above medications must be held the day of the exam AND 48 hours after the exam.  The purpose of you drinking the oral contrast is to aid in the visualization of your intestinal tract. The contrast solution may cause some diarrhea. Before your exam is started, you will be given a small amount of fluid to drink. Depending on your individual set of symptoms, you may also receive an intravenous injection of x-ray contrast/dye. Plan on being at [facility] for 30 minutes or long, depending on the type of exam you are having performed.  If you have any questions regarding your exam  or if you need to reschedule, you may call the CT department at [phone] between the hours of 8:00 am and 5:00 pm, Monday-Friday.  ________________________________________________________________________

## 2014-05-22 NOTE — Progress Notes (Signed)
Reviewed and agree with management plan.  Karron Goens T. Staisha Winiarski, MD FACG 

## 2014-05-22 NOTE — Progress Notes (Signed)
Patient ID: [name], female   DOB: 06-25-1932, 81 y.o.   MRN: [id number]   Subjective:    Patient ID: [name], female    DOB: [birth year], 81 y.o.   MRN: [id number]  HPI [name]  Is a 81 year old white female known to Dr. Fuller Plan who has history of IBS. She was seen for colonoscopy in January 2014 for persistent complaints of diarrhea. This showed mild diverticulosis of the left colon. Random biopsies were done and these were negative for microscopic colitis. Line Patient comes in today , referred back by Dr. Haynes Kerns for ongoing complaints of diarrhea abdominal pain and alternating constipation. She says she has had episodes of diarrhea for years generally her diarrhea lasts for 4-5 days and then she may have a day or 2 of constipation with no bowel movement. Says her stools are dark off and on but she does not noted any blood. She describes "bad" cramping at times she uses Bentyl as needed for more intense abdominal pain. She has ongoing problems with gas bloating cramping and may have several bowel movements in 1 day.  She says she does not tolerate medications well and has multiple medication allergies and does not like to take antibiotics because she always gets yeast infection. Been on any recent antibiotics. Appetite has been somewhat decreased though weight stable and she has periodic nausea.  She also has been dealing with new onset of hives since October 2014 she has been to the dermatologist and has an appointment to see an allergist for allergy testing next week.   she is frustrated by her symptoms and says she is worried about cancer.  Review of Systems Pertinent positive and negative review of systems were noted in the above HPI section.  All other review of systems was otherwise negative.  Outpatient Encounter Prescriptions as of 05/22/2014  Medication Sig  . ALPRAZolam (XANAX) 1 MG tablet Take 0.5 tablets (0.5 mg total) by mouth at bedtime as needed. For sleep  .  butalbital-acetaminophen-caffeine (FIORICET, ESGIC) 50-325-40 MG per tablet Take 1 tablet by mouth 2 (two) times daily as needed.  . calcium carbonate (TUMS - DOSED IN MG ELEMENTAL CALCIUM) 500 MG chewable tablet Chew 1 tablet by mouth daily.  . Cetirizine HCl 10 MG CAPS Take by mouth.  . dicyclomine (BENTYL) 10 MG capsule Take 10 mg by mouth 3 (three) times daily as needed. For stomach cramping  . DiphenhydrAMINE HCl (BENADRYL ALLERGY PO) Take by mouth.  . ezetimibe (ZETIA) 10 MG tablet Take 10 mg by mouth every Monday, Wednesday, and Friday.   . meclizine (ANTIVERT) 25 MG tablet Take 25 mg by mouth 3 (three) times daily as needed. For vertigo  . Methylcellulose, Laxative, (CITRUCEL PO) Take by mouth daily.  Marland Kitchen omeprazole (PRILOSEC) 20 MG capsule Take 20 mg by mouth daily.  . polyvinyl alcohol-povidone (REFRESH) 1.4-0.6 % ophthalmic solution Place 1-2 drops into both eyes as needed.  . Probiotic Product (SOLUBLE FIBER/PROBIOTICS PO) Take by mouth.  . rosuvastatin (CRESTOR) 5 MG tablet Take 2.5 mg by mouth daily.   . Vitamin D, Ergocalciferol, (DRISDOL) 50000 UNITS CAPS Take 50,000 Units by mouth every 7 (seven) days. sunday  . [DISCONTINUED] fluconazole (DIFLUCAN) 150 MG tablet Take one tablet.  Repeat in every week X 4 (Patient not taking: Reported on 05/22/2014)   No facility-administered encounter medications on file as of 05/22/2014.   Allergies  Allergen Reactions  . Bacitracin-Polymyxin B   . Doxepin Hcl   . Erythromycin   .  Hydrocortisone Other (See Comments)    After use of 1% HC cream, pt reported burning and feeling "scalded" in vaginal area.  . Levofloxacin   . Lidocaine Other (See Comments)    Unknown reaction  . Marcaine [Bupivacaine Hcl]   . Naproxen Sodium   . Penicillins   . Pseudoephedrine   . Sulfonamide Derivatives   . Cortisone Rash    Any hydrocortisone cream component with severe vaginal burning   Patient Active Problem List   Diagnosis Date Noted  . Acute  colitis 01/06/2012  . Hematochezia 01/06/2012  . Dehydration 01/06/2012  . IBS 12/31/2007  . BLOOD IN STOOL, OCCULT 12/31/2007  . CARCINOMA, BASAL CELL, SKIN 02/22/2007  . HYPERLIPIDEMIA 02/22/2007  . GASTROESOPHAGEAL REFLUX DISEASE 02/22/2007  . FRACTURE, FOOT 02/22/2007  . CHOLECYSTECTOMY, HX OF 02/22/2007   History   Social History  . Marital Status: Married    Spouse Name: N/A  . Number of Children: N/A  . Years of Education: N/A   Occupational History  . Not on file.   Social History Main Topics  . Smoking status: Never Smoker   . Smokeless tobacco: Never Used  . Alcohol Use: No  . Drug Use: No  . Sexual Activity: No   Other Topics Concern  . Not on file   Social History Narrative    Ms. Forrester's family history includes CAD in her brother; Cancer in her other; Diabetes in her other; Leukemia in her father; Pancreatic cancer in her brother. There is no history of Colon cancer.      Objective:    Filed Vitals:   05/22/14 1357  BP: 118/78  Pulse: 78    Physical Exam   Well-developed elderly white female in no acute distress, pleasant blood pressure 118/78 pulse 78 height 5 foot 1 weight 132. HEENT ;nontraumatic normocephalic EOMI PERRLA sclera anicteric, Supple; no JVD, Cardiovascular; regular rate and rhythm with S1-S2 no murmur or gallop, Pulmonary; abdomen soft no focal tenderness no guarding or rebound no palpable mass or hepatosplenomegaly bowel sounds are present, Rectal; exam not done, Extremities; no clubbing cyanosis or edema skin warm and dry, Psych; mood and affect appropriate       Assessment & Plan:   #1 81 yo female with hx of IBS - now diarrhea predominant but still alternates some with constipation.  May have component of bacterial Overgrowth. Increased pain complaints past few months- r/o other intra abdominal inflammatory process, malignancy #2 persistent urticaria #3 GERD #4 s/p GB  Plan;offered trial of Xifaxan 550 tid x 14 days  which she may benefit from but she is afraid to take an antibiotic Continue daily probiotic Use bentyl regularly 10 mg tid on diarrhea days Proceed with allergy testing Check celiac markers, ESR, CRP, stool for lactoferrin, fecal elastase Ct Abd/pelvis      Brytani Voth S Jaquasha Carnevale PA-C 05/22/2014   Cc: Crist Infante, MD

## 2014-05-25 ENCOUNTER — Other Ambulatory Visit: Payer: Medicare Other

## 2014-05-25 ENCOUNTER — Other Ambulatory Visit: Payer: Self-pay

## 2014-05-25 DIAGNOSIS — R1031 Right lower quadrant pain: Secondary | ICD-10-CM

## 2014-05-25 DIAGNOSIS — R197 Diarrhea, unspecified: Secondary | ICD-10-CM

## 2014-05-25 DIAGNOSIS — R1032 Left lower quadrant pain: Secondary | ICD-10-CM | POA: Diagnosis not present

## 2014-05-25 DIAGNOSIS — K589 Irritable bowel syndrome without diarrhea: Secondary | ICD-10-CM

## 2014-05-25 LAB — ANA: ANA: NEGATIVE

## 2014-05-26 LAB — FECAL LACTOFERRIN, QUANT: Lactoferrin: NEGATIVE

## 2014-05-27 ENCOUNTER — Ambulatory Visit (INDEPENDENT_AMBULATORY_CARE_PROVIDER_SITE_OTHER)
Admission: RE | Admit: 2014-05-27 | Discharge: 2014-05-27 | Disposition: A | Discharge status: Home / Self Care | Payer: Medicare Other | Source: Ambulatory Visit | Attending: Physician Assistant | Admitting: Physician Assistant

## 2014-05-27 DIAGNOSIS — R1031 Right lower quadrant pain: Secondary | ICD-10-CM | POA: Diagnosis not present

## 2014-05-27 DIAGNOSIS — R197 Diarrhea, unspecified: Secondary | ICD-10-CM | POA: Diagnosis not present

## 2014-05-27 DIAGNOSIS — R1032 Left lower quadrant pain: Secondary | ICD-10-CM | POA: Diagnosis not present

## 2014-05-27 DIAGNOSIS — K589 Irritable bowel syndrome without diarrhea: Secondary | ICD-10-CM

## 2014-05-27 DIAGNOSIS — K5730 Diverticulosis of large intestine without perforation or abscess without bleeding: Secondary | ICD-10-CM | POA: Diagnosis not present

## 2014-05-27 MED ORDER — IOHEXOL 300 MG/ML  SOLN
100.0000 mL | Freq: Once | INTRAMUSCULAR | Status: AC | PRN
  Administered 2014-05-27: 100 mL via INTRAVENOUS

## 2014-06-02 LAB — PANCREATIC ELASTASE, FECAL: Pancreatic Elastase-1, Stool: >500 mcg/g

## 2014-06-08 ENCOUNTER — Encounter: Payer: Self-pay | Admitting: Physician Assistant

## 2014-06-12 DIAGNOSIS — Z85828 Personal history of other malignant neoplasm of skin: Secondary | ICD-10-CM | POA: Diagnosis not present

## 2014-06-12 DIAGNOSIS — L300 Nummular dermatitis: Secondary | ICD-10-CM | POA: Diagnosis not present

## 2014-07-17 DIAGNOSIS — H52203 Unspecified astigmatism, bilateral: Secondary | ICD-10-CM | POA: Diagnosis not present

## 2014-07-17 DIAGNOSIS — H25813 Combined forms of age-related cataract, bilateral: Secondary | ICD-10-CM | POA: Diagnosis not present

## 2014-08-14 ENCOUNTER — Encounter: Payer: Self-pay | Admitting: Gastroenterology

## 2014-09-17 DIAGNOSIS — D539 Nutritional anemia, unspecified: Secondary | ICD-10-CM | POA: Diagnosis not present

## 2014-09-17 DIAGNOSIS — E559 Vitamin D deficiency, unspecified: Secondary | ICD-10-CM | POA: Diagnosis not present

## 2014-09-17 DIAGNOSIS — R8299 Other abnormal findings in urine: Secondary | ICD-10-CM | POA: Diagnosis not present

## 2014-09-17 DIAGNOSIS — E785 Hyperlipidemia, unspecified: Secondary | ICD-10-CM | POA: Diagnosis not present

## 2014-09-17 LAB — LAB REPORT - SCANNED
ALT: 15
AST: 18
Cholesterol, Total: 241
Creatinine, Ser: 0.8
GLUCOSE: 96
HDL Cholesterol: 49
HEMOGLOBIN: 14.1
LDL Cholesterol (Calc): 161
MCV: 102.7
Triglycerides: 157

## 2014-09-24 DIAGNOSIS — E785 Hyperlipidemia, unspecified: Secondary | ICD-10-CM | POA: Diagnosis not present

## 2014-09-24 DIAGNOSIS — I493 Ventricular premature depolarization: Secondary | ICD-10-CM | POA: Diagnosis not present

## 2014-09-24 DIAGNOSIS — Z0000 Encounter for general adult medical examination without abnormal findings: Secondary | ICD-10-CM | POA: Diagnosis not present

## 2014-09-24 DIAGNOSIS — F418 Other specified anxiety disorders: Secondary | ICD-10-CM | POA: Diagnosis not present

## 2014-09-24 DIAGNOSIS — R312 Other microscopic hematuria: Secondary | ICD-10-CM | POA: Diagnosis not present

## 2014-09-24 DIAGNOSIS — L509 Urticaria, unspecified: Secondary | ICD-10-CM | POA: Diagnosis not present

## 2014-09-24 DIAGNOSIS — H8110 Benign paroxysmal vertigo, unspecified ear: Secondary | ICD-10-CM | POA: Diagnosis not present

## 2014-09-24 DIAGNOSIS — Z6824 Body mass index (BMI) 24.0-24.9, adult: Secondary | ICD-10-CM | POA: Diagnosis not present

## 2014-09-24 DIAGNOSIS — Z1389 Encounter for screening for other disorder: Secondary | ICD-10-CM | POA: Diagnosis not present

## 2014-09-24 DIAGNOSIS — R350 Frequency of micturition: Secondary | ICD-10-CM | POA: Diagnosis not present

## 2014-09-24 DIAGNOSIS — M859 Disorder of bone density and structure, unspecified: Secondary | ICD-10-CM | POA: Diagnosis not present

## 2014-10-06 DIAGNOSIS — Z1212 Encounter for screening for malignant neoplasm of rectum: Secondary | ICD-10-CM | POA: Diagnosis not present

## 2014-10-06 LAB — FECAL OCCULT BLOOD, GUAIAC: Fecal Occult Blood: NEGATIVE

## 2014-11-03 DIAGNOSIS — J3089 Other allergic rhinitis: Secondary | ICD-10-CM | POA: Diagnosis not present

## 2014-11-03 DIAGNOSIS — L501 Idiopathic urticaria: Secondary | ICD-10-CM | POA: Diagnosis not present

## 2014-12-03 DIAGNOSIS — E785 Hyperlipidemia, unspecified: Secondary | ICD-10-CM | POA: Diagnosis not present

## 2014-12-03 LAB — LAB REPORT - SCANNED
ALT: 14
AST: 19
Cholesterol, Total: 168
Creatinine, Ser: 0.7
Glucose: 93
HDL: 51
LDL CHOLESTEROL (CALC): 97
TRIGLYCERIDES: 101

## 2014-12-16 ENCOUNTER — Ambulatory Visit: Payer: Medicare Other | Admitting: Nurse Practitioner

## 2015-01-29 ENCOUNTER — Ambulatory Visit (INDEPENDENT_AMBULATORY_CARE_PROVIDER_SITE_OTHER): Payer: Medicare Other | Admitting: Nurse Practitioner

## 2015-01-29 ENCOUNTER — Encounter: Payer: Self-pay | Admitting: Nurse Practitioner

## 2015-01-29 ENCOUNTER — Encounter: Payer: Self-pay | Admitting: *Deleted

## 2015-01-29 VITALS — BP 126/74 | HR 68 | Ht 60.25 in | Wt 135.0 lb

## 2015-01-29 DIAGNOSIS — Z124 Encounter for screening for malignant neoplasm of cervix: Secondary | ICD-10-CM | POA: Diagnosis not present

## 2015-01-29 DIAGNOSIS — Z01419 Encounter for gynecological examination (general) (routine) without abnormal findings: Secondary | ICD-10-CM | POA: Diagnosis not present

## 2015-01-29 NOTE — Progress Notes (Signed)
Patient ID: [name], female   DOB: [birth year], 82 y.o.   MRN: [id number]  82 y.o. [id number] Married  Caucasian Fe here for annual exam.  Patient had a bad case of hives ans saw dermatologist and then allergist. Now on prevention med's for this.  Sister moved here from [place] and that has caused her some increased stress.  Her husband also with HTN issues and on 2 med's - no longer able to be SA.  Patient's last menstrual period was 01/17/1988 (approximate).          Sexually active: No.  The current method of family planning is none.    Exercising: Yes.    walking 3 times per week Smoker:  no  Health Maintenance: Pap:  11/29/09, Negative MMG:  02/03/14, 3D, Bi-Rads 2:  Benign Colonoscopy:  02/02/12, Diverticulosis, no repeat due to age  - IFOB kit from PCP and was negative BMD:   2014? Dr. Joylene Draft TDaP:  UTD with Dr. Joylene Draft Shingles: Never, declined Pneumonia: Never, declined Hep C and HIV: Not indicated due to age Labs: Dr. Joylene Draft   reports that she has never smoked. She has never used smokeless tobacco. She reports that she does not drink alcohol or use illicit drugs.  Past Medical History  Diagnosis Date  . Diverticulitis   . Diverticulosis   . GERD (gastroesophageal reflux disease)   . Hypertension   . Esophageal stricture   . Benign neoplasm stomach body   . Internal hemorrhoids   . Hepatic steatosis   . Colitis   . Hyperlipidemia   . CTS (carpal tunnel syndrome)   . AR (allergic rhinitis)   . IBS (irritable bowel syndrome)   . Anxiety   . Skin cancer   . Basal cell carcinoma of ala nasi 2014    Past Surgical History  Procedure Laterality Date  . Cholecystectomy    . Mohs surgery  2014    nose    Current Outpatient Prescriptions  Medication Sig Dispense Refill  . butalbital-acetaminophen-caffeine (FIORICET, ESGIC) 50-325-40 MG per tablet Take 1 tablet by mouth 2 (two) times daily as needed.    . calcium carbonate (TUMS - DOSED IN MG ELEMENTAL CALCIUM) 500  MG chewable tablet Chew 1 tablet by mouth daily.    Marland Kitchen dicyclomine (BENTYL) 10 MG capsule Take 10 mg by mouth 3 (three) times daily as needed. For stomach cramping    . DiphenhydrAMINE HCl (BENADRYL ALLERGY PO) Take by mouth.    . ezetimibe (ZETIA) 10 MG tablet Take 10 mg by mouth every Monday, Wednesday, and Friday.     . hydrOXYzine (ATARAX/VISTARIL) 10 MG tablet Take 1 tablet by mouth daily.    Marland Kitchen levocetirizine (XYZAL) 5 MG tablet Take 1 tablet by mouth every morning.    . meclizine (ANTIVERT) 25 MG tablet Take 25 mg by mouth 3 (three) times daily as needed. For vertigo    . Methylcellulose, Laxative, (CITRUCEL PO) Take by mouth daily.    Marland Kitchen omeprazole (PRILOSEC) 20 MG capsule Take 20 mg by mouth daily.    . polyvinyl alcohol-povidone (REFRESH) 1.4-0.6 % ophthalmic solution Place 1-2 drops into both eyes as needed.    . Probiotic Product (SOLUBLE FIBER/PROBIOTICS PO) Take by mouth.    . ranitidine (ZANTAC) 150 MG tablet Take 1 tablet by mouth at bedtime.    . rosuvastatin (CRESTOR) 5 MG tablet Take 2.5 mg by mouth daily.     . Vitamin D, Ergocalciferol, (DRISDOL) 50000 UNITS CAPS Take 50,000 Units  by mouth every 7 (seven) days. sunday    . ALPRAZolam (XANAX) 1 MG tablet Take 0.5 tablets (0.5 mg total) by mouth at bedtime as needed. For sleep (Patient not taking: Reported on 01/29/2015) 30 tablet 0   No current facility-administered medications for this visit.    Family History  Problem Relation Age of Onset  . Cancer Other   . Diabetes Other   . CAD Brother   . Leukemia Father   . Pancreatic cancer Brother   . Colon cancer Neg Hx     ROS:  Pertinent items are noted in HPI.  Otherwise, a comprehensive ROS was negative.  Exam:   BP 126/74 mmHg  Pulse 68  Ht 5' 0.25" (1.53 m)  Wt 135 lb (61.236 kg)  BMI 26.16 kg/m2  LMP 01/17/1988 (Approximate) Height: 5' 0.25" (153 cm) Ht Readings from Last 3 Encounters:  01/29/15 5' 0.25" (1.53 m)  05/22/14 '5\' 1"'  (1.549 m)  07/29/13 '5\' 1"'   (1.549 m)    General appearance: alert, cooperative and appears stated age Head: Normocephalic, without obvious abnormality, atraumatic Neck: no adenopathy, supple, symmetrical, trachea midline and thyroid normal to inspection and palpation Lungs: clear to auscultation bilaterally Breasts: normal appearance, no masses or tenderness Heart: regular rate and rhythm Abdomen: soft, non-tender; no masses,  no organomegaly Extremities: extremities normal, atraumatic, no cyanosis or edema Skin: Skin color, texture, turgor normal. No rashes or lesions Lymph nodes: Cervical, supraclavicular, and axillary nodes normal. No abnormal inguinal nodes palpated Neurologic: Grossly normal   Pelvic: External genitalia:  no lesions              Urethra:  normal appearing urethra with no masses, tenderness or lesions              Bartholin's and Skene's: normal                 Vagina: normal appearing vagina with normal color and discharge, no lesions              Cervix: anteverted              Pap taken: No. Bimanual Exam:  Uterus:  normal size, contour, position, consistency, mobility, non-tender              Adnexa: no mass, fullness, tenderness               Rectovaginal: Confirms               Anus:  normal sphincter tone, no lesions  Chaperone present: no  A:  Well Woman with normal exam  Postmenopausal no HRT  Osteoporosis  - followed by PCP - declines treatment  Recent recurring hives  History of recurrent vulvitis - currently better     P:   Reviewed health and wellness pertinent to exam  Pap smear as above  Mammogram is due now and will schedule  Counseled on breast self exam, mammography screening, adequate intake of calcium and vitamin D, diet and exercise return annually or prn  An After Visit Summary was printed and given to the patient.

## 2015-01-29 NOTE — Patient Instructions (Signed)

## 2015-01-30 NOTE — Progress Notes (Signed)
Encounter reviewed by Dr. Mileidy Atkin Amundson C. Silva.  

## 2015-02-19 DIAGNOSIS — L570 Actinic keratosis: Secondary | ICD-10-CM | POA: Diagnosis not present

## 2015-02-19 DIAGNOSIS — L821 Other seborrheic keratosis: Secondary | ICD-10-CM | POA: Diagnosis not present

## 2015-02-19 DIAGNOSIS — Z85828 Personal history of other malignant neoplasm of skin: Secondary | ICD-10-CM | POA: Diagnosis not present

## 2015-02-19 DIAGNOSIS — L700 Acne vulgaris: Secondary | ICD-10-CM | POA: Diagnosis not present

## 2015-03-05 DIAGNOSIS — Z1231 Encounter for screening mammogram for malignant neoplasm of breast: Secondary | ICD-10-CM | POA: Diagnosis not present

## 2015-03-05 DIAGNOSIS — Z803 Family history of malignant neoplasm of breast: Secondary | ICD-10-CM | POA: Diagnosis not present

## 2015-05-14 DIAGNOSIS — J3089 Other allergic rhinitis: Secondary | ICD-10-CM | POA: Diagnosis not present

## 2015-05-14 DIAGNOSIS — L501 Idiopathic urticaria: Secondary | ICD-10-CM | POA: Diagnosis not present

## 2015-07-16 DIAGNOSIS — D225 Melanocytic nevi of trunk: Secondary | ICD-10-CM | POA: Diagnosis not present

## 2015-07-16 DIAGNOSIS — L509 Urticaria, unspecified: Secondary | ICD-10-CM | POA: Diagnosis not present

## 2015-07-16 DIAGNOSIS — L821 Other seborrheic keratosis: Secondary | ICD-10-CM | POA: Diagnosis not present

## 2015-07-16 DIAGNOSIS — L814 Other melanin hyperpigmentation: Secondary | ICD-10-CM | POA: Diagnosis not present

## 2015-07-16 DIAGNOSIS — L570 Actinic keratosis: Secondary | ICD-10-CM | POA: Diagnosis not present

## 2015-07-16 DIAGNOSIS — L817 Pigmented purpuric dermatosis: Secondary | ICD-10-CM | POA: Diagnosis not present

## 2015-07-16 DIAGNOSIS — L820 Inflamed seborrheic keratosis: Secondary | ICD-10-CM | POA: Diagnosis not present

## 2015-07-16 DIAGNOSIS — D1801 Hemangioma of skin and subcutaneous tissue: Secondary | ICD-10-CM | POA: Diagnosis not present

## 2015-07-16 DIAGNOSIS — Z85828 Personal history of other malignant neoplasm of skin: Secondary | ICD-10-CM | POA: Diagnosis not present

## 2015-07-30 DIAGNOSIS — H2513 Age-related nuclear cataract, bilateral: Secondary | ICD-10-CM | POA: Diagnosis not present

## 2015-07-30 DIAGNOSIS — H5213 Myopia, bilateral: Secondary | ICD-10-CM | POA: Diagnosis not present

## 2015-09-02 DIAGNOSIS — M859 Disorder of bone density and structure, unspecified: Secondary | ICD-10-CM | POA: Diagnosis not present

## 2015-09-02 DIAGNOSIS — E559 Vitamin D deficiency, unspecified: Secondary | ICD-10-CM | POA: Diagnosis not present

## 2015-09-02 LAB — HM DEXA SCAN

## 2015-10-14 DIAGNOSIS — R8299 Other abnormal findings in urine: Secondary | ICD-10-CM | POA: Diagnosis not present

## 2015-10-14 DIAGNOSIS — D538 Other specified nutritional anemias: Secondary | ICD-10-CM | POA: Diagnosis not present

## 2015-10-14 DIAGNOSIS — N390 Urinary tract infection, site not specified: Secondary | ICD-10-CM | POA: Diagnosis not present

## 2015-10-14 DIAGNOSIS — D539 Nutritional anemia, unspecified: Secondary | ICD-10-CM | POA: Diagnosis not present

## 2015-10-14 DIAGNOSIS — E784 Other hyperlipidemia: Secondary | ICD-10-CM | POA: Diagnosis not present

## 2015-10-14 DIAGNOSIS — E559 Vitamin D deficiency, unspecified: Secondary | ICD-10-CM | POA: Diagnosis not present

## 2015-10-14 LAB — LAB REPORT - SCANNED
ALT: 14
AST: 18
CREATININE: 0.8
Cholesterol, Total: 165
GLUCOSE: 89
HDL Cholesterol: 51
HEMOGLOBIN: 13.5
LDL Cholesterol (Calc): 91
MCV: 103.3
TSH: 2.83
Triglycerides: 115
VITAMIN B12: 947
Vit D, 25-Hydroxy: 44.8

## 2015-10-21 DIAGNOSIS — F418 Other specified anxiety disorders: Secondary | ICD-10-CM | POA: Diagnosis not present

## 2015-10-21 DIAGNOSIS — L508 Other urticaria: Secondary | ICD-10-CM | POA: Diagnosis not present

## 2015-10-21 DIAGNOSIS — R51 Headache: Secondary | ICD-10-CM | POA: Diagnosis not present

## 2015-10-21 DIAGNOSIS — I493 Ventricular premature depolarization: Secondary | ICD-10-CM | POA: Diagnosis not present

## 2015-10-21 DIAGNOSIS — Z6825 Body mass index (BMI) 25.0-25.9, adult: Secondary | ICD-10-CM | POA: Diagnosis not present

## 2015-10-21 DIAGNOSIS — D538 Other specified nutritional anemias: Secondary | ICD-10-CM | POA: Diagnosis not present

## 2015-10-21 DIAGNOSIS — M542 Cervicalgia: Secondary | ICD-10-CM | POA: Diagnosis not present

## 2015-10-21 DIAGNOSIS — Z0000 Encounter for general adult medical examination without abnormal findings: Secondary | ICD-10-CM | POA: Diagnosis not present

## 2015-10-21 DIAGNOSIS — R3129 Other microscopic hematuria: Secondary | ICD-10-CM | POA: Diagnosis not present

## 2015-10-21 DIAGNOSIS — H8113 Benign paroxysmal vertigo, bilateral: Secondary | ICD-10-CM | POA: Diagnosis not present

## 2015-10-21 DIAGNOSIS — Z1389 Encounter for screening for other disorder: Secondary | ICD-10-CM | POA: Diagnosis not present

## 2015-10-21 DIAGNOSIS — M859 Disorder of bone density and structure, unspecified: Secondary | ICD-10-CM | POA: Diagnosis not present

## 2016-05-12 DIAGNOSIS — L501 Idiopathic urticaria: Secondary | ICD-10-CM | POA: Diagnosis not present

## 2016-05-12 DIAGNOSIS — J3089 Other allergic rhinitis: Secondary | ICD-10-CM | POA: Diagnosis not present

## 2016-07-11 ENCOUNTER — Telehealth: Payer: Self-pay | Admitting: Gastroenterology

## 2016-07-12 NOTE — Telephone Encounter (Signed)
Patient notified of the recommendations She will call back if she has additional new symptoms.

## 2016-07-12 NOTE — Telephone Encounter (Signed)
Patient reports that yesterday she had terrible pain and the feeling sh had to have a BM.  She had diarrhea and then had bleeding. She had blood independent of stool yesterday, she has had some gas and passed a little bit of blood this am when she first got up.  She has had no further BM or the urge to have a BM this am.  She says the tissue is pink when she wipes. She had some discomfort this am but  feels a little bit better. She does have a history of diverticulosis. Dr. Fuller Plan please advise

## 2016-07-12 NOTE — Telephone Encounter (Signed)
A self limited infection or ischemia is very likely. Since she is feeling better today advise rest at home and stay on clear liquids for today and advance diet slowly over 2-3 days. Drink plenty of fluids to maintain very good hydration. If symptoms return please call back.

## 2016-08-11 DIAGNOSIS — H52203 Unspecified astigmatism, bilateral: Secondary | ICD-10-CM | POA: Diagnosis not present

## 2016-08-11 DIAGNOSIS — H2513 Age-related nuclear cataract, bilateral: Secondary | ICD-10-CM | POA: Diagnosis not present

## 2016-08-31 DIAGNOSIS — H2511 Age-related nuclear cataract, right eye: Secondary | ICD-10-CM | POA: Diagnosis not present

## 2016-08-31 DIAGNOSIS — H25811 Combined forms of age-related cataract, right eye: Secondary | ICD-10-CM | POA: Diagnosis not present

## 2016-09-07 ENCOUNTER — Encounter: Payer: Self-pay | Admitting: Family Medicine

## 2016-09-07 ENCOUNTER — Ambulatory Visit (INDEPENDENT_AMBULATORY_CARE_PROVIDER_SITE_OTHER): Payer: Medicare Other | Admitting: Family Medicine

## 2016-09-07 ENCOUNTER — Encounter (INDEPENDENT_AMBULATORY_CARE_PROVIDER_SITE_OTHER): Payer: Self-pay

## 2016-09-07 VITALS — BP 122/80 | HR 65 | Temp 97.5°F | Ht 61.0 in | Wt 134.5 lb

## 2016-09-07 DIAGNOSIS — G4700 Insomnia, unspecified: Secondary | ICD-10-CM | POA: Diagnosis not present

## 2016-09-07 DIAGNOSIS — R51 Headache: Secondary | ICD-10-CM

## 2016-09-07 DIAGNOSIS — E785 Hyperlipidemia, unspecified: Secondary | ICD-10-CM

## 2016-09-07 DIAGNOSIS — R519 Headache, unspecified: Secondary | ICD-10-CM

## 2016-09-07 NOTE — Progress Notes (Signed)
New patient.  Requesting records from prior PCP and cardiac clinic.   Frequent HA.  Fioricet helps.  Going on since the 6th grade- "I would go home from school with a headache."  No clear dx of migraines per patient report but needs to lay in a photo/phonophobia.  Throbs with pain. No vomiting.  No aura.  Unclear about FH- father may have had similar. They were worse around her menses prev.  She'll have 4-5 in a week.  Using fioricet early in the HA helps.  Variable duration, can last all day.  Caffeine helps. D/w at about rebound and tapering caffeine and fioricet use.  4 cups of coffee a day, some decaf.   S/p R cataract surgery.  Her vision is some better in the meantime.  Still on drops per eye clinic. No fevers.  No drainage.    H/o diverticulitis in the past.    Hydroxyzine helps with sleep.  She prev used for hives and insomnia.   Elevated Cholesterol: Using medications without problems:yes Muscle aches: no Diet compliance:encouarged.  "not too good."  D/w pt.   Exercise:encoruaged.  Limited recently.   Requesting records.    Encouraged to get a flu shot.  Rationale d/w pt.  She had a hx of local reaction in the past, but no true allergy or contraindication.    PMH and SH reviewed  ROS: Per HPI unless specifically indicated in ROS section   Meds, vitals, and allergies reviewed.   GEN: nad, alert and oriented HEENT: mucous membranes moist NECK: supple w/o LA CV: rrr PULM: ctab, no inc wob ABD: soft, +bs EXT: no edema SKIN: no acute rash

## 2016-09-07 NOTE — Patient Instructions (Signed)
Let me get your old records and we'll go from there.  I'll see about options for headache prevention.   Try to slowly taper back a little on caffeine but don't quit cold [place].  Take care.  Glad to see you.  Update me as needed.

## 2016-09-10 ENCOUNTER — Encounter: Payer: Self-pay | Admitting: Family Medicine

## 2016-09-10 DIAGNOSIS — R51 Headache: Principal | ICD-10-CM

## 2016-09-10 DIAGNOSIS — R519 Headache, unspecified: Secondary | ICD-10-CM | POA: Insufficient documentation

## 2016-09-10 DIAGNOSIS — E785 Hyperlipidemia, unspecified: Secondary | ICD-10-CM | POA: Insufficient documentation

## 2016-09-10 DIAGNOSIS — G4700 Insomnia, unspecified: Secondary | ICD-10-CM | POA: Insufficient documentation

## 2016-09-10 NOTE — Assessment & Plan Note (Signed)
Likely migraine by her history. Discussed with patient. Needs to taper caffeine. Discussed with her about caffeine and migraine pathophysiology along with rebound effect from Fioricet. Requesting old records.

## 2016-09-10 NOTE — Assessment & Plan Note (Signed)
Still using hydroxyzine as needed. Continue as is.

## 2016-09-10 NOTE — Assessment & Plan Note (Signed)
Able to tolerate current medications. Requesting records. No change in meds at this point. >30 minutes spent in face to face time with patient, >50% spent in counselling or coordination of care, discussing cholesterol hx, diet, prior medical history, headache frequency and pathophysiology and treatment, etc.

## 2016-09-15 ENCOUNTER — Other Ambulatory Visit: Payer: Self-pay | Admitting: Family Medicine

## 2016-09-15 NOTE — Telephone Encounter (Signed)
Electronic refill request. Dicyclomine Last office visit:   09/07/2016 Last Filled:   ?  NP Please advise.

## 2016-09-18 MED ORDER — DICYCLOMINE HCL 10 MG PO CAPS: ORAL_CAPSULE | ORAL | 1 refills | Status: DC

## 2016-09-18 NOTE — Telephone Encounter (Signed)
Please call in.  Thanks.   

## 2016-09-18 NOTE — Addendum Note (Signed)
Addended by: Carter Kitten on: 09/18/2016 03:26 PM   Modules accepted: Orders

## 2016-09-18 NOTE — Telephone Encounter (Deleted)
Last office visit?

## 2016-09-19 NOTE — Telephone Encounter (Signed)
Rx called to pharmacy as instructed. 

## 2016-09-21 DIAGNOSIS — H25812 Combined forms of age-related cataract, left eye: Secondary | ICD-10-CM | POA: Diagnosis not present

## 2016-09-21 DIAGNOSIS — H2512 Age-related nuclear cataract, left eye: Secondary | ICD-10-CM | POA: Diagnosis not present

## 2016-09-26 ENCOUNTER — Other Ambulatory Visit: Payer: Self-pay | Admitting: *Deleted

## 2016-09-26 NOTE — Telephone Encounter (Signed)
[name] called stating that patient is trying to switch all of her medications over to you and needs a new script sent in for Crestor 5 mg. Last office visit 09/07/16 No recent cholesterol check on file.

## 2016-09-27 MED ORDER — ROSUVASTATIN CALCIUM 5 MG PO TABS: 2.5000 mg | ORAL_TABLET | Freq: Every day | ORAL | 1 refills | Status: DC

## 2016-09-27 NOTE — Telephone Encounter (Signed)
Sent. Thanks.   

## 2016-09-29 DIAGNOSIS — L821 Other seborrheic keratosis: Secondary | ICD-10-CM | POA: Diagnosis not present

## 2016-09-29 DIAGNOSIS — D225 Melanocytic nevi of trunk: Secondary | ICD-10-CM | POA: Diagnosis not present

## 2016-09-29 DIAGNOSIS — L570 Actinic keratosis: Secondary | ICD-10-CM | POA: Diagnosis not present

## 2016-09-29 DIAGNOSIS — L817 Pigmented purpuric dermatosis: Secondary | ICD-10-CM | POA: Diagnosis not present

## 2016-09-29 DIAGNOSIS — D1801 Hemangioma of skin and subcutaneous tissue: Secondary | ICD-10-CM | POA: Diagnosis not present

## 2016-09-29 DIAGNOSIS — L918 Other hypertrophic disorders of the skin: Secondary | ICD-10-CM | POA: Diagnosis not present

## 2016-09-29 DIAGNOSIS — Z85828 Personal history of other malignant neoplasm of skin: Secondary | ICD-10-CM | POA: Diagnosis not present

## 2016-09-29 DIAGNOSIS — L814 Other melanin hyperpigmentation: Secondary | ICD-10-CM | POA: Diagnosis not present

## 2016-10-08 ENCOUNTER — Encounter: Payer: Self-pay | Admitting: Family Medicine

## 2016-10-08 DIAGNOSIS — M810 Age-related osteoporosis without current pathological fracture: Secondary | ICD-10-CM | POA: Insufficient documentation

## 2016-10-08 DIAGNOSIS — M8580 Other specified disorders of bone density and structure, unspecified site: Secondary | ICD-10-CM | POA: Insufficient documentation

## 2016-10-16 ENCOUNTER — Encounter: Payer: Self-pay | Admitting: Family Medicine

## 2016-10-24 ENCOUNTER — Encounter: Payer: Self-pay | Admitting: Family Medicine

## 2016-10-25 ENCOUNTER — Other Ambulatory Visit: Payer: Self-pay | Admitting: Family Medicine

## 2016-10-25 DIAGNOSIS — M8580 Other specified disorders of bone density and structure, unspecified site: Secondary | ICD-10-CM

## 2016-10-25 DIAGNOSIS — E785 Hyperlipidemia, unspecified: Secondary | ICD-10-CM

## 2016-10-25 DIAGNOSIS — M899 Disorder of bone, unspecified: Secondary | ICD-10-CM

## 2016-10-25 NOTE — Progress Notes (Signed)
Patient was at the office yesterday, with her husband. She asked about follow-up labs. She was correct, is due for follow-up labs. I put those in. Assuming her labs are okay, she would not have to have an immediate follow-up office visit. Thanks.

## 2016-10-25 NOTE — Progress Notes (Signed)
Patient's wife notified as instructed by telephone and verbalized understanding. Lab appointment scheduled as instructed.

## 2016-11-09 ENCOUNTER — Other Ambulatory Visit (INDEPENDENT_AMBULATORY_CARE_PROVIDER_SITE_OTHER): Payer: Medicare Other

## 2016-11-09 DIAGNOSIS — M899 Disorder of bone, unspecified: Secondary | ICD-10-CM

## 2016-11-09 DIAGNOSIS — M8580 Other specified disorders of bone density and structure, unspecified site: Secondary | ICD-10-CM | POA: Diagnosis not present

## 2016-11-09 DIAGNOSIS — E785 Hyperlipidemia, unspecified: Secondary | ICD-10-CM | POA: Diagnosis not present

## 2016-11-09 LAB — COMPREHENSIVE METABOLIC PANEL
ALT: 10 U/L (ref 0–35)
AST: 15 U/L (ref 0–37)
Albumin: 3.9 g/dL (ref 3.5–5.2)
Alkaline Phosphatase: 97 U/L (ref 39–117)
BUN: 14 mg/dL (ref 6–23)
CO2: 29 meq/L (ref 19–32)
Calcium: 10 mg/dL (ref 8.4–10.5)
Chloride: 105 mEq/L (ref 96–112)
Creatinine, Ser: 0.83 mg/dL (ref 0.40–1.20)
GFR: 69.93 mL/min (ref >60.00)
GLUCOSE: 101 mg/dL — AB (ref 70–99)
POTASSIUM: 4.9 meq/L (ref 3.5–5.1)
SODIUM: 139 meq/L (ref 135–145)
Total Bilirubin: 0.5 mg/dL (ref 0.2–1.2)
Total Protein: 6.9 g/dL (ref 6.0–8.3)

## 2016-11-09 LAB — LIPID PANEL
Cholesterol: 165 mg/dL (ref 0–200)
HDL: 53.3 mg/dL (ref >39.00)
LDL CALC: 92 mg/dL (ref 0–99)
NonHDL: 112.18
Total CHOL/HDL Ratio: 3
Triglycerides: 103 mg/dL (ref 0.0–149.0)
VLDL: 20.6 mg/dL (ref 0.0–40.0)

## 2016-11-09 LAB — VITAMIN D 25 HYDROXY (VIT D DEFICIENCY, FRACTURES): VITD: 80.93 ng/mL (ref 30.00–100.00)

## 2016-11-10 ENCOUNTER — Ambulatory Visit (INDEPENDENT_AMBULATORY_CARE_PROVIDER_SITE_OTHER): Payer: Medicare Other | Admitting: Family Medicine

## 2016-11-10 ENCOUNTER — Encounter: Payer: Self-pay | Admitting: Family Medicine

## 2016-11-10 DIAGNOSIS — E785 Hyperlipidemia, unspecified: Secondary | ICD-10-CM | POA: Diagnosis not present

## 2016-11-10 NOTE — Progress Notes (Signed)
Elevated Cholesterol: Using medications without problems:yes Muscle aches: some diffuse aches unclear if from statin.  More likely from OA in the knees etc.   Diet compliance: encouraged Exercise: limited recently.  D/w pt.  She is going to restart.   Labs d/w pt.    She had cataract surgery recent, and got new rx for eyeglasses.  Her eyes have been itching in the meantime and she restarted refresh drops in the meantime.    PMH and SH reviewed  Meds, vitals, and allergies reviewed.   ROS: Per HPI unless specifically indicated in ROS section   GEN: nad, alert and oriented HEENT: mucous membranes moist NECK: supple w/o LA CV: rrr. PULM: ctab, no inc wob ABD: soft, +bs EXT: no edema SKIN: no acute rash

## 2016-11-10 NOTE — Patient Instructions (Signed)
Stop the crestor for 1 week and if the aches get a lot better, then stay off and let me know.  If the aches don't get dramatically better, then restart the med.  Take care.  Glad to see you.

## 2016-11-12 NOTE — Assessment & Plan Note (Signed)
Stop the crestor for 1 week and if the aches get a lot better, then stay off and let me know.  If the aches don't get dramatically better, then restart the med.   Also I asked her to consider her medicine allergy list. If she can recall any specific details to clarify her intolerances versus allergies, and that would be helpful.

## 2016-12-15 DIAGNOSIS — H01004 Unspecified blepharitis left upper eyelid: Secondary | ICD-10-CM | POA: Diagnosis not present

## 2017-02-07 ENCOUNTER — Telehealth: Payer: Self-pay

## 2017-02-07 MED ORDER — BUTALBITAL-APAP-CAFFEINE 50-325-40 MG PO TABS: 1.0000 | ORAL_TABLET | Freq: Two times a day (BID) | ORAL | 0 refills | Status: DC | PRN

## 2017-02-07 NOTE — Telephone Encounter (Signed)
Notified patient of Dr. Carole Civil comments and that R/x has been sent in.

## 2017-02-07 NOTE — Telephone Encounter (Signed)
Sent. Thanks.  If needed frequently or if not better, then needs f/u OV.  The refill should transfer if not filled at [facility] today.

## 2017-02-07 NOTE — Telephone Encounter (Signed)
[name] at [facility] left v/m;Pt has been getting Fiorinal caps from Dr Brigitte Pulse prior to switching to Dr Damita Dunnings. pt was requesting regular fiorinal capsules(butalbital,ASA, and caffeine caps); pt takes 1 - 2 caps q 4 h prn for headache; pt only takes few times a year.Please advise. Pt last seen 11/10/16. Dr Damita Dunnings has not prescribed before. Fioricet is on med list.

## 2017-03-29 DIAGNOSIS — Z803 Family history of malignant neoplasm of breast: Secondary | ICD-10-CM | POA: Diagnosis not present

## 2017-03-29 DIAGNOSIS — Z1231 Encounter for screening mammogram for malignant neoplasm of breast: Secondary | ICD-10-CM | POA: Diagnosis not present

## 2017-04-06 ENCOUNTER — Telehealth: Payer: Self-pay | Admitting: Family Medicine

## 2017-04-06 NOTE — Telephone Encounter (Signed)
Copied from [place] [id number]. Topic: Quick Communication - See Telephone Encounter >> Apr 06, 2017 11:25 AM Hewitt Shorts wrote: CRM for notification. See Telephone encounter for: 04/06/17.pt states that she got a call to call back to schedule an AAWV but there is not CRM in the chart   Best number [phone]

## 2017-04-10 ENCOUNTER — Other Ambulatory Visit: Payer: Self-pay

## 2017-04-10 NOTE — Telephone Encounter (Signed)
I spoke with pt; pt said she has approx 4 H/A's per wk. Pt requesting a 90 day refill on butalbital-apap-caffeine to [facility].last refilled # 20 on 02/07/17. Pt last seen 11/10/16.No future appt scheduled.Please advise.

## 2017-04-10 NOTE — Telephone Encounter (Signed)
Copied from [place] [id number]. Topic: General - Other >> Apr 10, 2017  9:34 AM Conception Chancy, NT wrote: Patient is calling and would like to speak to Dr. Damita Dunnings nurse about a prescription and the quantity. Patient did not want to disclose information with me, is requesting a call back. Please advise.

## 2017-04-11 MED ORDER — BUTALBITAL-APAP-CAFFEINE 50-325-40 MG PO TABS: 1.0000 | ORAL_TABLET | Freq: Two times a day (BID) | ORAL | 1 refills | Status: DC | PRN

## 2017-04-11 NOTE — Telephone Encounter (Signed)
Patient notified as instructed by telephone and verbalized understanding. 

## 2017-04-11 NOTE — Telephone Encounter (Signed)
Sent for 100 tabs with 1 rf.  Update me as needed.  Thanks.

## 2017-04-18 NOTE — Telephone Encounter (Signed)
I called pt and set her up in May 2019 for AWV.

## 2017-04-27 DIAGNOSIS — J3089 Other allergic rhinitis: Secondary | ICD-10-CM | POA: Diagnosis not present

## 2017-04-27 DIAGNOSIS — L501 Idiopathic urticaria: Secondary | ICD-10-CM | POA: Diagnosis not present

## 2017-05-28 ENCOUNTER — Other Ambulatory Visit: Payer: Self-pay | Admitting: Family Medicine

## 2017-05-28 DIAGNOSIS — E785 Hyperlipidemia, unspecified: Secondary | ICD-10-CM

## 2017-05-29 ENCOUNTER — Ambulatory Visit (INDEPENDENT_AMBULATORY_CARE_PROVIDER_SITE_OTHER): Payer: Medicare Other

## 2017-05-29 VITALS — BP 110/70 | HR 73 | Temp 97.6°F | Ht 60.0 in | Wt 134.2 lb

## 2017-05-29 DIAGNOSIS — E7849 Other hyperlipidemia: Secondary | ICD-10-CM

## 2017-05-29 DIAGNOSIS — Z0000 Encounter for general adult medical examination without abnormal findings: Secondary | ICD-10-CM | POA: Diagnosis not present

## 2017-05-29 DIAGNOSIS — E785 Hyperlipidemia, unspecified: Secondary | ICD-10-CM | POA: Diagnosis not present

## 2017-05-29 LAB — COMPREHENSIVE METABOLIC PANEL
ALBUMIN: 4 g/dL (ref 3.5–5.2)
ALT: 8 U/L (ref 0–35)
AST: 11 U/L (ref 0–37)
Alkaline Phosphatase: 102 U/L (ref 39–117)
BUN: 8 mg/dL (ref 6–23)
CO2: 27 mEq/L (ref 19–32)
CREATININE: 0.75 mg/dL (ref 0.40–1.20)
Calcium: 10.5 mg/dL (ref 8.4–10.5)
Chloride: 104 mEq/L (ref 96–112)
GFR: 78.5 mL/min (ref >60.00)
GLUCOSE: 93 mg/dL (ref 70–99)
Potassium: 4.4 mEq/L (ref 3.5–5.1)
SODIUM: 137 meq/L (ref 135–145)
TOTAL PROTEIN: 7.5 g/dL (ref 6.0–8.3)
Total Bilirubin: 0.5 mg/dL (ref 0.2–1.2)

## 2017-05-29 LAB — CBC WITH DIFFERENTIAL/PLATELET
BASOS PCT: 0.6 % (ref 0.0–3.0)
Basophils Absolute: 0 10*3/uL (ref 0.0–0.1)
EOS ABS: 0.1 10*3/uL (ref 0.0–0.7)
Eosinophils Relative: 2.2 % (ref 0.0–5.0)
HCT: 41.2 % (ref 36.0–46.0)
HEMOGLOBIN: 14.2 g/dL (ref 12.0–15.0)
LYMPHS ABS: 1.3 10*3/uL (ref 0.7–4.0)
Lymphocytes Relative: 24.8 % (ref 12.0–46.0)
MCHC: 34.4 g/dL (ref 30.0–36.0)
MCV: 96.3 fl (ref 78.0–100.0)
MONO ABS: 0.4 10*3/uL (ref 0.1–1.0)
Monocytes Relative: 8.1 % (ref 3.0–12.0)
NEUTROS ABS: 3.3 10*3/uL (ref 1.4–7.7)
NEUTROS PCT: 64.3 % (ref 43.0–77.0)
PLATELETS: 225 10*3/uL (ref 150.0–400.0)
RBC: 4.28 Mil/uL (ref 3.87–5.11)
RDW: 12.9 % (ref 11.5–15.5)
WBC: 5.1 10*3/uL (ref 4.0–10.5)

## 2017-05-29 LAB — LIPID PANEL
CHOLESTEROL: 249 mg/dL — AB (ref 0–200)
HDL: 49.5 mg/dL (ref >39.00)
LDL Cholesterol: 164 mg/dL — ABNORMAL HIGH (ref 0–99)
NonHDL: 199.56
Total CHOL/HDL Ratio: 5
Triglycerides: 176 mg/dL — ABNORMAL HIGH (ref 0.0–149.0)
VLDL: 35.2 mg/dL (ref 0.0–40.0)

## 2017-05-29 LAB — TSH: TSH: 3.44 u[IU]/mL (ref 0.35–4.50)

## 2017-05-29 NOTE — Progress Notes (Signed)
PCP notes:   Health maintenance:  PNA vaccine - pt declined  Abnormal screenings:   Hearing - failed  Hearing Screening   125Hz  250Hz  500Hz  1000Hz  2000Hz  3000Hz  4000Hz  6000Hz  8000Hz   Right ear:   40 40 40  40    Left ear:   40 40 40  0     Patient concerns:   Increased anxiety - pt wants to discuss using Xanax with PCP at next appt  Fatigue - verbalized being tired and having no energy. PCP made aware.   Nurse concerns:  None  Next PCP appt:   06/05/17 @ 1045  I reviewed health advisor's note, was available for consultation on the day of service listed in this note, and agree with documentation and plan. Elsie Stain, MD.

## 2017-05-29 NOTE — Progress Notes (Signed)
Subjective:   [name] is a 84 y.o. female who presents for Medicare Annual (Subsequent) preventive examination.  Review of Systems:  N/A Cardiac Risk Factors include: advanced age (>94men, >55 women);dyslipidemia     Objective:     Vitals: BP 110/70 (BP Location: Right Arm, Patient Position: Sitting, Cuff Size: Normal)   Pulse 73   Temp 97.6 F (36.4 C) (Oral)   Ht 5' (1.524 m) Comment: no shoes  Wt 134 lb 4 oz (60.9 kg)   LMP 01/17/1988 (Approximate)   SpO2 97%   BMI 26.22 kg/m   Body mass index is 26.22 kg/m.  Advanced Directives 05/29/2017 01/06/2012  Does Patient Have a Medical Advance Directive? No Patient would like information;Patient does not have advance directive  Would patient like information on creating a medical advance directive? No - Patient declined Advance directive packet given  Pre-existing out of facility DNR order (yellow form or pink MOST form) - No    Tobacco Social History   Tobacco Use  Smoking Status Never Smoker  Smokeless Tobacco Never Used     Counseling given: No   Clinical Intake:  Pre-visit preparation completed: Yes  Pain : No/denies pain Pain Score: 0-No pain     Nutritional Status: BMI 25 -29 Overweight Nutritional Risks: None Diabetes: No  How often do you need to have someone help you when you read instructions, pamphlets, or other written materials from your doctor or pharmacy?: 1 - Never What is the last grade level you completed in school?: 12th grade + 1 yr college  Interpreter Needed?: No  Comments: pt lives with spouse Information entered by :: LPinson, LPN  Past Medical History:  Diagnosis Date  . Anxiety   . AR (allergic rhinitis)   . Basal cell carcinoma of [place] n[place] 2014  . Benign neoplasm stomach body   . Cataract    corrected with surgery  . Colitis   . CTS (carpal tunnel syndrome)   . Diverticulitis   . Diverticulosis   . Esophageal stricture   . Frequent headaches    likely  migraine  . GERD (gastroesophageal reflux disease)   . Hepatic steatosis   . Hyperlipidemia   . Hypertension   . IBS (irritable bowel syndrome)   . Insomnia   . Internal hemorrhoids   . Osteopenia    Past Surgical History:  Procedure Laterality Date  . CATARACT EXTRACTION, BILATERAL     August 2018, October 2018  . CHOLECYSTECTOMY    . MOHS SURGERY  2014   nose   Family History  Problem Relation Age of Onset  . Cancer Other   . Diabetes Other   . CAD Brother   . Leukemia Father   . Pancreatic cancer Brother   . CAD Mother   . Breast cancer Sister   . Mesothelioma Brother   . Colon cancer Neg Hx    Social History   Socioeconomic History  . Marital status: Married    Spouse name: Not on file  . Number of children: Not on file  . Years of education: Not on file  . Highest education level: Not on file  Occupational History  . Not on file  Social Needs  . Financial resource strain: Not on file  . Food insecurity:    Worry: Not on file    Inability: Not on file  . Transportation needs:    Medical: Not on file    Non-medical: Not on file  Tobacco Use  .  Smoking status: Never Smoker  . Smokeless tobacco: Never Used  Substance and Sexual Activity  . Alcohol use: No  . Drug use: No  . Sexual activity: Not Currently    Partners: Male  Lifestyle  . Physical activity:    Days per week: Not on file    Minutes per session: Not on file  . Stress: Not on file  Relationships  . Social connections:    Talks on phone: Not on file    Gets together: Not on file    Attends religious service: Not on file    Active member of club or organization: Not on file    Attends meetings of clubs or organizations: Not on file    Relationship status: Not on file  Other Topics Concern  . 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 butalbital-acetaminophen-caffeine (FIORICET, ESGIC) 50-325-40 MG tablet Take 1 tablet by mouth 2 (two) times daily as needed.  . Cholecalciferol (VITAMIN D-3) 5000 units TABS Take one tablet daily  (Monday thru Friday)   5 tablets total per week  . dicyclomine (BENTYL) 10 MG capsule TAKE 1 CAPSULE BY MOUTH UP TO FOUR TIMESA DAY AS NEEDED FOR ABDOMINALCRAMPS  . Difluprednate (DUREZOL) 0.05 % EMUL Apply 1 drop to eye 4 (four) times daily.  Marland Kitchen ezetimibe (ZETIA) 10 MG tablet Take 10 mg by mouth every Monday, Wednesday, and Friday.   . hydrOXYzine (ATARAX/VISTARIL) 10 MG tablet Take 1 tablet by mouth daily.  Marland Kitchen LEVOCETIRIZINE DIHYDROCHLORIDE PO Take 1 tablet by mouth daily.  . meclizine (ANTIVERT) 25 MG tablet Take 25 mg by mouth 3 (three) times daily as needed. For vertigo  . moxifloxacin (VIGAMOX) 0.5 % ophthalmic solution Apply 1 drop to eye 4 (four) times daily.  . Multiple Vitamin (MULTIVITAMIN) tablet Take 1 tablet by mouth daily.  Marland Kitchen omeprazole (PRILOSEC) 20 MG capsule Take 20 mg by mouth daily.  . Probiotic Product (SOLUBLE FIBER/PROBIOTICS PO) Take by mouth.  . rosuvastatin (CRESTOR) 5 MG tablet Take 0.5 tablets (2.5 mg total) by mouth daily. (Patient not taking: Reported on 05/29/2017)   No facility-administered encounter medications on file as of 05/29/2017.     Activities of Daily Living In your present state of health, do you have any difficulty performing the following activities: 05/29/2017  Hearing? N  Vision? N  Difficulty concentrating or making decisions? N  Walking or climbing stairs? N  Dressing or bathing? N  Doing errands, shopping? N  Preparing Food and eating ? N  Using the Toilet? N  In the past six months, have you accidently leaked urine? N  Do you have problems with loss of bowel control? N  Managing your Medications? N  Managing your Finances? N  Housekeeping or managing your Housekeeping? N  Some recent data might be hidden    Patient Care  Team: Tonia Ghent, MD as PCP - General (Family Medicine)    Assessment:   This is a routine wellness examination for [place].   Hearing Screening   125Hz  250Hz  500Hz  1000Hz  2000Hz  3000Hz  4000Hz  6000Hz  8000Hz   Right ear:   40 40 40  40    Left ear:   40 40 40  0    Vision Screening Comments: Vision exam in Oct 2018 with Dr. Ellie Lunch  Exercise Activities and Dietary recommendations Current Exercise Habits: The patient does not participate in regular exercise at present, Exercise limited by: None identified  Goals    . DIET - INCREASE WATER INTAKE     Starting 05/29/2017, I will attempt to drink 6-8 glasses of water daily.        Fall Risk Fall Risk  05/29/2017  Falls in the past year? No   Depression Screen PHQ 2/9 Scores 05/29/2017  PHQ - 2 Score 0  PHQ- 9 Score 0     Cognitive Function MMSE - Mini Mental State Exam 05/29/2017  Orientation to time 5  Orientation to Place 5  Registration 3  Attention/ Calculation 0  Recall 3  Language- name 2 objects 0  Language- repeat 1  Language- follow 3 step command 3  Language- read & follow direction 0  Write a sentence 0  Copy design 0  Total score 20     PLEASE NOTE: A Mini-Cog screen was completed. Maximum score is 20. A value of 0 denotes this part of Folstein MMSE was not completed or the patient failed this part of the Mini-Cog screening.   Mini-Cog Screening Orientation to Time - Max 5 pts Orientation to Place - Max 5 pts Registration - Max 3 pts Recall - Max 3 pts Language Repeat - Max 1 pts Language Follow 3 Step Command - Max 3 pts     Immunization History  Administered Date(s) Administered  . Pneumococcal Polysaccharide-23 01/28/2010, 07/05/2012  . Td 01/29/2000, 01/28/2010  . Zoster 01/28/2010   Screening Tests Health Maintenance  Topic Date Due  . PNA vac Low Risk Adult (2 of 2 - PCV13) 05/29/2048 (Originally 07/05/2013)  . INFLUENZA VACCINE  08/16/2017  . TETANUS/TDAP  01/29/2020  . DEXA SCAN   Completed      Plan:     I have personally reviewed, addressed, and noted the following in the patient's chart:  A. Medical and social history B. Use of alcohol, tobacco or illicit drugs  C. Current medications and supplements D. Functional ability and status E.  Nutritional status F.  Physical activity G. Advance directives H. List of other physicians I.  Hospitalizations, surgeries, and ER visits in previous 12 months J.  Adin to include hearing, vision, cognitive, depression L. Referrals and appointments - none  In addition, I have reviewed and discussed with patient certain preventive protocols, quality metrics, and best practice recommendations. A written personalized care plan for preventive services as well as general preventive health recommendations were provided to patient.  See attached scanned questionnaire for additional information.   Signed,   Lindell Noe, MHA, BS, LPN Health Coach

## 2017-05-29 NOTE — Patient Instructions (Signed)
[name] , Thank you for taking time to come for your Medicare Wellness Visit. I appreciate your ongoing commitment to your health goals. Please review the following plan we discussed and let me know if I can assist you in the future.   These are the goals we discussed: Goals    . DIET - INCREASE WATER INTAKE     Starting 05/29/2017, I will attempt to drink 6-8 glasses of water daily.        This is a list of the screening recommended for you and due dates:  Health Maintenance  Topic Date Due  . Pneumonia vaccines (2 of 2 - PCV13) 05/29/2048*  . Flu Shot  08/16/2017  . Tetanus Vaccine  01/29/2020  . DEXA scan (bone density measurement)  Completed  *Topic was postponed. The date shown is not the original due date.   Preventive Care for Adults  A healthy lifestyle and preventive care can promote health and wellness. Preventive health guidelines for adults include the following key practices.  . A routine yearly physical is a good way to check with your health care provider about your health and preventive screening. It is a chance to share any concerns and updates on your health and to receive a thorough exam.  . Visit your dentist for a routine exam and preventive care every 6 months. Brush your teeth twice a day and floss once a day. Good oral hygiene prevents tooth decay and gum disease.  . The frequency of eye exams is based on your age, health, family medical history, use  of contact lenses, and other factors. Follow your health care provider's recommendations for frequency of eye exams.  . Eat a healthy diet. Foods like vegetables, fruits, whole grains, low-fat dairy products, and lean protein foods contain the nutrients you need without too many calories. Decrease your intake of foods high in solid fats, added sugars, and salt. Eat the right amount of calories for you. Get information about a proper diet from your health care provider, if necessary.  . Regular physical  exercise is one of the most important things you can do for your health. Most adults should get at least 150 minutes of moderate-intensity exercise (any activity that increases your heart rate and causes you to sweat) each week. In addition, most adults need muscle-strengthening exercises on 2 or more days a week.  Silver Sneakers may be a benefit available to you. To determine eligibility, you may visit the website: www.silversneakers.com or contact program at [phone] Mon-Fri between 8AM-8PM.   . Maintain a healthy weight. The body mass index (BMI) is a screening tool to identify possible weight problems. It provides an estimate of body fat based on height and weight. Your health care provider can find your BMI and can help you achieve or maintain a healthy weight.   For adults 20 years and older: ? A BMI below 18.5 is considered underweight. ? A BMI of 18.5 to 24.9 is normal. ? A BMI of 25 to 29.9 is considered overweight. ? A BMI of 30 and above is considered obese.   . Maintain normal blood lipids and cholesterol levels by exercising and minimizing your intake of saturated fat. Eat a balanced diet with plenty of fruit and vegetables. Blood tests for lipids and cholesterol should begin at age 28 and be repeated every 5 years. If your lipid or cholesterol levels are high, you are over 50, or you are at high risk for heart disease, you may  need your cholesterol levels checked more frequently. Ongoing high lipid and cholesterol levels should be treated with medicines if diet and exercise are not working.  . If you smoke, find out from your health care provider how to quit. If you do not use tobacco, please do not start.  . If you choose to drink alcohol, please do not consume more than 2 drinks per day. One drink is considered to be 12 ounces (355 mL) of beer, 5 ounces (148 mL) of wine, or 1.5 ounces (44 mL) of liquor.  . If you are 23-4 years old, ask your health care provider if you  should take aspirin to prevent strokes.  . Use sunscreen. Apply sunscreen liberally and repeatedly throughout the day. You should seek shade when your shadow is shorter than you. Protect yourself by wearing long sleeves, pants, a wide-brimmed hat, and sunglasses year round, whenever you are outdoors.  . Once a month, do a whole body skin exam, using a mirror to look at the skin on your back. Tell your health care provider of new moles, moles that have irregular borders, moles that are larger than a pencil eraser, or moles that have changed in shape or color.

## 2017-06-05 ENCOUNTER — Encounter: Payer: Self-pay | Admitting: Family Medicine

## 2017-06-05 ENCOUNTER — Ambulatory Visit (INDEPENDENT_AMBULATORY_CARE_PROVIDER_SITE_OTHER): Payer: Medicare Other | Admitting: Family Medicine

## 2017-06-05 VITALS — BP 110/70 | HR 73 | Temp 97.6°F | Ht 60.0 in | Wt 134.2 lb

## 2017-06-05 DIAGNOSIS — R51 Headache: Secondary | ICD-10-CM

## 2017-06-05 DIAGNOSIS — Z0000 Encounter for general adult medical examination without abnormal findings: Secondary | ICD-10-CM

## 2017-06-05 DIAGNOSIS — K219 Gastro-esophageal reflux disease without esophagitis: Secondary | ICD-10-CM | POA: Diagnosis not present

## 2017-06-05 DIAGNOSIS — Z7189 Other specified counseling: Secondary | ICD-10-CM

## 2017-06-05 DIAGNOSIS — R519 Headache, unspecified: Secondary | ICD-10-CM

## 2017-06-05 DIAGNOSIS — K589 Irritable bowel syndrome without diarrhea: Secondary | ICD-10-CM | POA: Diagnosis not present

## 2017-06-05 DIAGNOSIS — E785 Hyperlipidemia, unspecified: Secondary | ICD-10-CM | POA: Diagnosis not present

## 2017-06-05 DIAGNOSIS — F419 Anxiety disorder, unspecified: Secondary | ICD-10-CM

## 2017-06-05 MED ORDER — SERTRALINE HCL 25 MG PO TABS: 25.0000 mg | ORAL_TABLET | Freq: Every day | ORAL | 3 refills | Status: DC

## 2017-06-05 NOTE — Progress Notes (Signed)
She admits to being worried about her husband, who is chronically ill with h/o colitis.  She has a noted caregiver strain.  D/w pt.  I suspect some of the anxiety and fatigue she has may be related to her concerns about him.     Husband and both kids equally designated if patient were incapacitated. Mammogram and DXA deferred for now.  She'll fu with gyn.  D/w pt.   Declined hearing aids.   Elevated Cholesterol: Using medications without problems: yes Muscle aches: no Diet compliance: encouraged Exercise: encouraged She is off crestor and zetia.   She is off PPI and tolerating time off med.  D/w pt.  Would continue off med if tolerated. She is swallowing well.    IBS.  She has used dicyclomine as needed.  Not used daily.  No blood in stool.  She continues with headaches and has used fioricet prn.  No ADE on med.   PMH and SH reviewed  ROS: Per HPI unless specifically indicated in ROS section   Meds, vitals, and allergies reviewed.   GEN: nad, alert and oriented HEENT: mucous membranes moist NECK: supple w/o LA CV: rrr PULM: ctab, no inc wob ABD: soft, +bs EXT: no edema SKIN: no acute rash

## 2017-06-05 NOTE — Patient Instructions (Signed)
Try taking sertraline and see if that helps with sleep.  It shouldn't interact with your other meds.  It will take about 1 month to make a difference.  Update me as needed.  Take care.  Glad to see you.

## 2017-06-06 DIAGNOSIS — Z85828 Personal history of other malignant neoplasm of skin: Secondary | ICD-10-CM | POA: Diagnosis not present

## 2017-06-06 DIAGNOSIS — Z7189 Other specified counseling: Secondary | ICD-10-CM | POA: Insufficient documentation

## 2017-06-06 DIAGNOSIS — Z0000 Encounter for general adult medical examination without abnormal findings: Secondary | ICD-10-CM | POA: Insufficient documentation

## 2017-06-06 DIAGNOSIS — F419 Anxiety disorder, unspecified: Secondary | ICD-10-CM | POA: Insufficient documentation

## 2017-06-06 DIAGNOSIS — L570 Actinic keratosis: Secondary | ICD-10-CM | POA: Diagnosis not present

## 2017-06-06 NOTE — Assessment & Plan Note (Signed)
SSRI may help with headache frequency.  Otherwise continue to use Fioricet as needed.  No adverse effect on medication. >25 minutes spent in face to face time with patient, >50% spent in counselling or coordination of care, discussing lipids, headaches, IBS, anxiety, etc.

## 2017-06-06 NOTE — Assessment & Plan Note (Signed)
Discussed with patient.  I did not start her on a benzodiazepine.  She requested a prescription for Xanax.  I declined this.  Rationale discussed with patient.  She can use hydroxyzine as needed.  Start sertraline in the meantime.  Rationale discussed.  She is likely going to need a daily preventive/treatment medication so SSRI would be reasonable.  Routine cautions given and routine timeline discussed.  Update me as needed.  See after visit summary.  She agrees.  No suicidal or homicidal intent.  Okay for outpatient follow-up.

## 2017-06-06 NOTE — Assessment & Plan Note (Signed)
Husband and both kids equally designated if patient were incapacitated. Mammogram and DXA deferred for now.  She'll fu with gyn.  D/w pt.   Declined hearing aids.  Vaccination discussed with patient.  Pneumonia vaccine declined.

## 2017-06-06 NOTE — Assessment & Plan Note (Signed)
Continue as needed dicyclomine.  Update me as needed.  No blood in stool.  Okay for outpatient follow-up.

## 2017-06-06 NOTE — Assessment & Plan Note (Signed)
She is off crestor and zetia.  Advised to restart.  Labs discussed with patient.

## 2017-06-06 NOTE — Assessment & Plan Note (Signed)
Husband and both kids equally designated if patient were incapacitated. 

## 2017-06-06 NOTE — Assessment & Plan Note (Signed)
Currently off PPI but swallowing well.  Would continue as is for now.  Update me as needed.  She agrees.

## 2017-06-15 ENCOUNTER — Ambulatory Visit (INDEPENDENT_AMBULATORY_CARE_PROVIDER_SITE_OTHER)
Admission: RE | Admit: 2017-06-15 | Discharge: 2017-06-15 | Disposition: A | Discharge status: Home / Self Care | Payer: Medicare Other | Source: Ambulatory Visit | Attending: Family Medicine | Admitting: Family Medicine

## 2017-06-15 ENCOUNTER — Ambulatory Visit (INDEPENDENT_AMBULATORY_CARE_PROVIDER_SITE_OTHER): Payer: Medicare Other | Admitting: Family Medicine

## 2017-06-15 ENCOUNTER — Encounter: Payer: Self-pay | Admitting: Family Medicine

## 2017-06-15 VITALS — BP 132/78 | HR 69 | Temp 97.9°F | Ht 60.0 in | Wt 136.0 lb

## 2017-06-15 DIAGNOSIS — M79671 Pain in right foot: Secondary | ICD-10-CM

## 2017-06-15 NOTE — Progress Notes (Signed)
Foot pain.   Possible bruise on the bottom of her right foot.   Denies any injury.  Hurts to walk on it and first noticed it the first of the week and progressed in the meantime. Pain with weight bearing but not at rest o/w.    Meds, vitals, and allergies reviewed.   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  Normal DP pulse.   She had tenderness at the distal midfoot, near the distal third metatarsal. Likely dropped R 3rd MT head with loss of arch as she had some slight decrease in pain with use of a metatarsal pad.

## 2017-06-15 NOTE — Patient Instructions (Signed)
Go to the lab on the way out.  We'll contact you with your xray report. Assuming you don't have a fracture, then use a metatarsal pad behind the sore spot.  Update me as needed.  Take care.  Glad to see you.

## 2017-06-17 DIAGNOSIS — M79671 Pain in right foot: Secondary | ICD-10-CM | POA: Insufficient documentation

## 2017-06-17 NOTE — Assessment & Plan Note (Signed)
Likely dropped R 3rd MT head with loss of arch as she had some slight decrease in pain with use of a metatarsal pad.  Discussed with patient about anatomy.  She can use an insert in her shoe with a metatarsal pad in place.  Check plain films.  Routine cautions given.  The changes she has with plethora are otherwise unremarkable.

## 2017-06-26 ENCOUNTER — Other Ambulatory Visit: Payer: Self-pay | Admitting: Family Medicine

## 2017-06-27 ENCOUNTER — Ambulatory Visit (INDEPENDENT_AMBULATORY_CARE_PROVIDER_SITE_OTHER): Payer: Medicare Other | Admitting: Family Medicine

## 2017-06-27 ENCOUNTER — Encounter: Payer: Self-pay | Admitting: Family Medicine

## 2017-06-27 VITALS — BP 130/80 | HR 68 | Temp 97.5°F | Ht 60.0 in | Wt 136.5 lb

## 2017-06-27 DIAGNOSIS — S83422A Sprain of lateral collateral ligament of left knee, initial encounter: Secondary | ICD-10-CM | POA: Diagnosis not present

## 2017-06-27 DIAGNOSIS — Z889 Allergy status to unspecified drugs, medicaments and biological substances status: Secondary | ICD-10-CM | POA: Diagnosis not present

## 2017-06-27 NOTE — Progress Notes (Signed)
Dr. Frederico Hamman T. Hope Holst, MD, [facility] [place], [address] Phone: [phone] Fax: [phone]  06/27/2017  Patient: [name], MRN: [id number], DOB: [birth year], 84 y.o.  Primary Physician:  Tonia Ghent, MD   Chief Complaint  Patient presents with  . Knee Pain    Left   Subjective:   [name] is a 84 y.o. very pleasant female patient who presents with the following:  Left knee, turned a few weeks ago and has done it a couple of times. This week it is gotten a lot worse.   Very pleasant lady who a few weeks ago turned her knee, and is since then tweaked her knee slightly in the interim.  It is primarily hurting laterally.  She does like this week it might have gotten a little bit worse.  She has not had an effusion at all.  She has no prior significant knee injury or trauma history.  No surgical history.  She has not taken any medication, and she did try a fabric sleeve, which has not helped particularly all that much.  Past Medical History, Surgical History, Social History, Family History, Problem List, Medications, and Allergies have been reviewed and updated if relevant.  Patient Active Problem List   Diagnosis Date Noted  . Right foot pain 06/17/2017  . Health care maintenance 06/06/2017  . Advance care planning 06/06/2017  . Anxiety 06/06/2017  . Medicare annual wellness visit, subsequent 05/29/2017  . Osteopenia 10/08/2016  . Insomnia   . Frequent headaches   . IBS 12/31/2007  . CARCINOMA, BASAL CELL, SKIN 02/22/2007  . HLD (hyperlipidemia) 02/22/2007  . GASTROESOPHAGEAL REFLUX DISEASE 02/22/2007  . CHOLECYSTECTOMY, HX OF 02/22/2007    Past Medical History:  Diagnosis Date  . Anxiety   . AR (allergic rhinitis)   . Basal cell carcinoma of ala nasi 2014  . Benign neoplasm stomach body   . Cataract    corrected with surgery  . Colitis   . CTS (carpal tunnel syndrome)   .  Diverticulitis   . Diverticulosis   . Esophageal stricture   . Frequent headaches    likely migraine  . GERD (gastroesophageal reflux disease)   . Hepatic steatosis   . Hyperlipidemia   . Hypertension   . IBS (irritable bowel syndrome)   . Insomnia   . Internal hemorrhoids   . Osteopenia     Past Surgical History:  Procedure Laterality Date  . CATARACT EXTRACTION, BILATERAL     August 2018, October 2018  . CHOLECYSTECTOMY    . MOHS SURGERY  2014   nose    Social History   Socioeconomic History  . Marital status: Married    Spouse name: Not on file  . Number of children: Not on file  . Years of education: Not on file  . Highest education level: Not on file  Occupational History  . Not on file  Social Needs  . Financial resource strain: Not on file  . Food insecurity:    Worry: Not on file    Inability: Not on file  . Transportation needs:    Medical: Not on file    Non-medical: Not on file  Tobacco Use  . Smoking status: Never Smoker  . Smokeless tobacco: Never Used  Substance and Sexual Activity  . Alcohol use: No  . Drug use: No  . Sexual activity: Not Currently    Partners: Male  Lifestyle  .  Physical activity:    Days per week: Not on file    Minutes per session: Not on file  . Stress: Not on file  Relationships  . Social connections:    Talks on phone: Not on file    Gets together: Not on file    Attends religious service: Not on file    Active member of club or organization: Not on file    Attends meetings of clubs or organizations: Not on file    Relationship status: Not on file  . Intimate partner violence:    Fear of current or ex partner: Not on file    Emotionally abused: Not on file    Physically abused: Not on file    Forced sexual activity: Not on file  Other Topics Concern  . 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 Cancer Other   . Diabetes Other   . CAD Brother   . Leukemia Father   . Pancreatic cancer Brother   . CAD Mother   . Breast cancer Sister   . Mesothelioma Brother   . Colon cancer Neg Hx     Allergies  Allergen Reactions  . Aspirin   . Ciprofloxacin   . Influenza Vaccines     Local reaction  . Nexium [Esomeprazole Magnesium]   . Niacin And Related   . Propylene Glycol   . Vagifem [Estradiol]   . Allegra [Fexofenadine]   . Bacitracin-Polymyxin B   . Ceclor [Cefaclor]   . Celebrex [Celecoxib]   . Corticosteroids   . Doxepin Hcl   . Erythromycin   . Flagyl [Metronidazole]   . Hydrocortisone Other (See Comments)    After use of 1% HC cream, pt reported burning and feeling "scalded" in vaginal area.  . Levofloxacin   . Levsin [Hyoscyamine Sulfate]   . Lidocaine Other (See Comments)    Unknown reaction  . Macrobid [Nitrofurantoin Macrocrystal]   . Marcaine [Bupivacaine Hcl]   . Naproxen   . Nsaids   . Penicillins   . Prevacid [Lansoprazole]     Able to tolerate omeprazole.   . Pseudoephedrine   . Sulfonamide Derivatives   . Tavist [Clemastine]   . Cortisone Rash    Any hydrocortisone cream component with severe vaginal burning    Medication list reviewed and updated in full in [facility].  GEN: No fevers, chills. Nontoxic. Primarily MSK c/o today. MSK: Detailed in the HPI GI: tolerating PO intake without difficulty Neuro: No numbness, parasthesias, or tingling associated. Otherwise the pertinent positives of the ROS are noted above.   Objective:   BP 130/80   Pulse 68   Temp (!) 97.5 F (36.4 C) (Oral)   Ht 5' (1.524 m)   Wt 136 lb 8 oz (61.9 kg)   LMP 01/17/1988 (Approximate)   BMI 26.66 kg/m    GEN: WDWN, NAD, Non-toxic, Alert & Oriented x 3 HEENT: Atraumatic, Normocephalic.  Ears and Nose: No external deformity. EXTR: No clubbing/cyanosis/edema NEURO: Normal gait.  PSYCH: Normally interactive.  Conversant. Not depressed or anxious appearing.  Calm demeanor.    Left knee: Full extension.  Flexion to 120 degrees.  Nontender on the medial joint line.  Stable to valgus stress.  Patient does have some discomfort with varus stress, but the  joint does not open, and she has some pain with direct palpation on the lateral collateral ligament upon stress.  McMurray's and flexion pinch are grossly negative.  ACL and PCL appear to be intact.  Radiology:   Assessment and Plan:   Sprain of lateral collateral ligament of left knee, initial encounter  Multiple drug allergies  This appears to be a grade 1 LCL sprain.  I placed the patient in a hinged patellar J brace for support, and instructed her to wear this over the next 3 weeks.  If she is not significantly improved after a month, then I want her to follow back up with me.  Lateral meniscal tear would be possible, but less likely.  Signed,  Maud Deed. Malahki Gasaway, MD   Allergies as of 06/27/2017      Reactions   Aspirin    Ciprofloxacin    Influenza Vaccines    Local reaction   Nexium [esomeprazole Magnesium]    Niacin And Related    Propylene Glycol    Vagifem [estradiol]    Allegra [fexofenadine]    Bacitracin-polymyxin B    Ceclor [cefaclor]    Celebrex [celecoxib]    Corticosteroids    Doxepin Hcl    Erythromycin    Flagyl [metronidazole]    Hydrocortisone Other (See Comments)   After use of 1% HC cream, pt reported burning and feeling "scalded" in vaginal area.   Levofloxacin    Levsin [hyoscyamine Sulfate]    Lidocaine Other (See Comments)   Unknown reaction   Macrobid [nitrofurantoin Macrocrystal]    Marcaine [bupivacaine Hcl]    Naproxen    Nsaids    Penicillins    Prevacid [lansoprazole]    Able to tolerate omeprazole.    Pseudoephedrine    Sulfonamide Derivatives    Tavist [clemastine]    Cortisone Rash   Any hydrocortisone cream component with severe vaginal burning      Medication List         Accurate as of 06/27/17 11:59 PM. Always use your most recent med list.          butalbital-acetaminophen-caffeine 50-325-40 MG tablet Commonly known as:  FIORICET, ESGIC Take 1 tablet by mouth 2 (two) times daily as needed.   dicyclomine 10 MG capsule Commonly known as:  BENTYL TAKE 1 CAPSULE BY MOUTH UP TO FOUR TIMESA DAY AS NEEDED FOR ABDOMINALCRAMPS   DUREZOL 0.05 % Emul Generic drug:  Difluprednate Apply 1 drop to eye 4 (four) times daily.   ezetimibe 10 MG tablet Commonly known as:  ZETIA TAKE 1 TABLET BY MOUTH DAILY   hydrOXYzine 10 MG tablet Commonly known as:  ATARAX/VISTARIL Take 1 tablet by mouth daily.   LEVOCETIRIZINE DIHYDROCHLORIDE PO Take 1 tablet by mouth daily.   meclizine 25 MG tablet Commonly known as:  ANTIVERT Take 25 mg by mouth 3 (three) times daily as needed. For vertigo   multivitamin tablet Take 1 tablet by mouth daily.   omeprazole 20 MG capsule Commonly known as:  PRILOSEC Take 20 mg by mouth daily.   rosuvastatin 5 MG tablet Commonly known as:  CRESTOR Take 0.5 tablets (2.5 mg total) by mouth daily.   sertraline 25 MG tablet Commonly known as:  ZOLOFT Take 1 tablet (25 mg total) by mouth daily.   SOLUBLE FIBER/PROBIOTICS PO Take by mouth.   Vitamin D-3 5000 units Tabs Take one tablet daily  (Monday thru Friday)   5 tablets total per week

## 2017-08-17 DIAGNOSIS — H52203 Unspecified astigmatism, bilateral: Secondary | ICD-10-CM | POA: Diagnosis not present

## 2017-08-17 DIAGNOSIS — Z961 Presence of intraocular lens: Secondary | ICD-10-CM | POA: Diagnosis not present

## 2017-08-20 ENCOUNTER — Ambulatory Visit: Payer: Self-pay | Admitting: Family Medicine

## 2017-08-20 NOTE — Telephone Encounter (Signed)
Pt stated that last Thursday she had an episode of increased heart rate. Pt stated that it lasted thirty minutes and went away. Pt stated she has been under stress. Pt stated the night of increased heart rate occurred at bedtime. Pt stated earlier that day, she had coffee and tea. Pt drinks coffee every am. Pt denies palpitations, chest pain, shortness of breath or dizziness. Pt stated that the increased heart rate comes and goes.  During call her BP was 128/88 HR 94. She stated that she was worried about her husband's fatigue.  Care advice given per protocol. Pt verbalized understanding.  Appt made for pt 08/28/17 at 12:00 with Dr Damita Dunnings. Reason for Disposition . Problems with anxiety or stress  Answer Assessment - Initial Assessment Questions 1. DESCRIPTION: "Please describe your heart rate or heart beat that you are having" (e.g., fast/slow, regular/irregular, skipped or extra beats, "palpitations")     Fast heart rate 2. ONSET: "When did it start?" (Minutes, hours or days)      Last Thursday 3. DURATION: "How long does it last" (e.g., seconds, minutes, hours)     30 minutes 4. PATTERN "Does it come and go, or has it been constant since it started?"  "Does it get worse with exertion?"   "Are you feeling it now?"     Comes and goes- no pt was trying to fall asleep-no 5. TAP: "Using your hand, can you tap out what you are feeling on a chair or table in front of you, so that I can hear?" (Note: not all patients can do this)       Fast rhythm and steady 6. HEART RATE: "Can you tell me your heart rate?" "How many beats in 15 seconds?"  (Note: not all patients can do this)       Pt checked HR on a BP machine: 128/88  HR 94 7. RECURRENT SYMPTOM: "Have you ever had this before?" If so, ask: "When was the last time?" and "What happened that time?"      no 8. CAUSE: "What do you think is causing the palpitations?"     anxiety 9. CARDIAC HISTORY: "Do you have any history of heart disease?" (e.g.,  heart attack, angina, bypass surgery, angioplasty, arrhythmia)      no 10. OTHER SYMPTOMS: "Do you have any other symptoms?" (e.g., dizziness, chest pain, sweating, difficulty breathing)       no 11. PREGNANCY: "Is there any chance you are pregnant?" "When was your last menstrual period?"       n/a  Protocols used: HEART RATE AND HEARTBEAT QUESTIONS-A-AH

## 2017-08-26 ENCOUNTER — Other Ambulatory Visit: Payer: Self-pay | Admitting: Family Medicine

## 2017-08-28 ENCOUNTER — Encounter: Payer: Self-pay | Admitting: Family Medicine

## 2017-08-28 ENCOUNTER — Ambulatory Visit (INDEPENDENT_AMBULATORY_CARE_PROVIDER_SITE_OTHER): Payer: Medicare Other | Admitting: Family Medicine

## 2017-08-28 VITALS — BP 110/72 | HR 71 | Temp 97.8°F | Ht 60.0 in | Wt 133.8 lb

## 2017-08-28 DIAGNOSIS — R000 Tachycardia, unspecified: Secondary | ICD-10-CM | POA: Diagnosis not present

## 2017-08-28 LAB — COMPREHENSIVE METABOLIC PANEL
ALT: 11 U/L (ref 0–35)
AST: 15 U/L (ref 0–37)
Albumin: 4.3 g/dL (ref 3.5–5.2)
Alkaline Phosphatase: 87 U/L (ref 39–117)
BILIRUBIN TOTAL: 0.6 mg/dL (ref 0.2–1.2)
BUN: 12 mg/dL (ref 6–23)
CO2: 30 meq/L (ref 19–32)
CREATININE: 0.82 mg/dL (ref 0.40–1.20)
Calcium: 10.8 mg/dL — ABNORMAL HIGH (ref 8.4–10.5)
Chloride: 105 mEq/L (ref 96–112)
GFR: 70.77 mL/min (ref >60.00)
GLUCOSE: 104 mg/dL — AB (ref 70–99)
Potassium: 4.6 mEq/L (ref 3.5–5.1)
Sodium: 139 mEq/L (ref 135–145)
TOTAL PROTEIN: 7.5 g/dL (ref 6.0–8.3)

## 2017-08-28 LAB — CBC WITH DIFFERENTIAL/PLATELET
BASOS PCT: 0.8 % (ref 0.0–3.0)
Basophils Absolute: 0 10*3/uL (ref 0.0–0.1)
EOS PCT: 3.1 % (ref 0.0–5.0)
Eosinophils Absolute: 0.1 10*3/uL (ref 0.0–0.7)
HCT: 40.2 % (ref 36.0–46.0)
Hemoglobin: 14 g/dL (ref 12.0–15.0)
LYMPHS ABS: 1.4 10*3/uL (ref 0.7–4.0)
Lymphocytes Relative: 31.5 % (ref 12.0–46.0)
MCHC: 34.8 g/dL (ref 30.0–36.0)
MCV: 96.7 fl (ref 78.0–100.0)
MONO ABS: 0.4 10*3/uL (ref 0.1–1.0)
MONOS PCT: 9.3 % (ref 3.0–12.0)
NEUTROS ABS: 2.5 10*3/uL (ref 1.4–7.7)
NEUTROS PCT: 55.3 % (ref 43.0–77.0)
Platelets: 204 10*3/uL (ref 150.0–400.0)
RBC: 4.16 Mil/uL (ref 3.87–5.11)
RDW: 12.9 % (ref 11.5–15.5)
WBC: 4.5 10*3/uL (ref 4.0–10.5)

## 2017-08-28 LAB — TSH: TSH: 1.98 u[IU]/mL (ref 0.35–4.50)

## 2017-08-28 NOTE — Patient Instructions (Addendum)
Go to the lab on the way out.  We'll contact you with your lab report. Gradually cut back on caffeine in the meantime.  I would start back on the sertraline daily to see if that helped with sleep and anxiety.  Take care.  Glad to see you.  If you have more symptoms then let me know.

## 2017-08-28 NOTE — Progress Notes (Signed)
Her foot painimproved with using a metatarsal pad in the meantime.    Increased heart rate.  About 2 weeks ago she had trouble sleeping.  She had sensation of heart racing at the time.  She got up and started walking, tried to get back to sleep. She took a dose of sertraline after sx started but hadn't been on med chronically.  She is worried about her husband at baseline.  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  The night prior to sx she had tea with caffeine and coffee.  fioricet also has caffeine, used a few times a week.    Per patient, her heart felt regular but fast at the time.  It lasted about 1 hour.  No CP.  No syncope.    No sx since the episode.    Meds, vitals, and allergies reviewed.   ROS: Per HPI unless specifically indicated in ROS section   GEN: nad, alert and oriented HEENT: mucous membranes moist NECK: supple w/o LA CV: rrr PULM: ctab, no inc wob ABD: soft, +bs EXT: no edema SKIN: no acute rash  EKG w/o acute changes.

## 2017-08-30 DIAGNOSIS — R000 Tachycardia, unspecified: Secondary | ICD-10-CM | POA: Insufficient documentation

## 2017-08-30 NOTE — Assessment & Plan Note (Signed)
She had a single episode where her heart rate was fast but regular per patient report.  She has significant anxiety and worries at baseline.  She has not been on sertraline chronically.  Discussed with patient about restarting sertraline, even if this had nothing to do with the recent episode.  She understood.  She had the episode after significant caffeine intake.  Discussed with patient about tapering caffeine.  Offered cardiology referral, declined by patient for now.  If she has any more symptoms she will let me know.  Routine cautions given.  EKG discussed with patient.  See notes on routine labs done today.  She agrees with plan. >25 minutes spent in face to face time with patient, >50% spent in counselling or coordination of care.

## 2017-09-09 ENCOUNTER — Emergency Department (HOSPITAL_COMMUNITY): Payer: Medicare Other

## 2017-09-09 ENCOUNTER — Encounter (HOSPITAL_COMMUNITY): Payer: Self-pay | Admitting: *Deleted

## 2017-09-09 ENCOUNTER — Emergency Department (HOSPITAL_COMMUNITY)
Admission: EM | Admit: 2017-09-09 | Discharge: 2017-09-09 | Disposition: A | Discharge status: Home / Self Care | Payer: Medicare Other | Attending: Emergency Medicine | Admitting: Emergency Medicine

## 2017-09-09 ENCOUNTER — Other Ambulatory Visit: Payer: Self-pay

## 2017-09-09 DIAGNOSIS — Z7902 Long term (current) use of antithrombotics/antiplatelets: Secondary | ICD-10-CM | POA: Insufficient documentation

## 2017-09-09 DIAGNOSIS — I10 Essential (primary) hypertension: Secondary | ICD-10-CM | POA: Diagnosis not present

## 2017-09-09 DIAGNOSIS — R0902 Hypoxemia: Secondary | ICD-10-CM | POA: Diagnosis not present

## 2017-09-09 DIAGNOSIS — R079 Chest pain, unspecified: Secondary | ICD-10-CM | POA: Diagnosis not present

## 2017-09-09 DIAGNOSIS — Z85828 Personal history of other malignant neoplasm of skin: Secondary | ICD-10-CM | POA: Diagnosis not present

## 2017-09-09 DIAGNOSIS — I499 Cardiac arrhythmia, unspecified: Secondary | ICD-10-CM | POA: Diagnosis not present

## 2017-09-09 DIAGNOSIS — R002 Palpitations: Secondary | ICD-10-CM | POA: Diagnosis not present

## 2017-09-09 DIAGNOSIS — Z733 Stress, not elsewhere classified: Secondary | ICD-10-CM | POA: Insufficient documentation

## 2017-09-09 DIAGNOSIS — Z79899 Other long term (current) drug therapy: Secondary | ICD-10-CM | POA: Diagnosis not present

## 2017-09-09 DIAGNOSIS — I4891 Unspecified atrial fibrillation: Secondary | ICD-10-CM | POA: Diagnosis not present

## 2017-09-09 DIAGNOSIS — R000 Tachycardia, unspecified: Secondary | ICD-10-CM | POA: Diagnosis not present

## 2017-09-09 DIAGNOSIS — I491 Atrial premature depolarization: Secondary | ICD-10-CM | POA: Diagnosis not present

## 2017-09-09 LAB — CBC WITH DIFFERENTIAL/PLATELET
Abs Immature Granulocytes: 0 10*3/uL (ref 0.0–0.1)
Basophils Absolute: 0 10*3/uL (ref 0.0–0.1)
Basophils Relative: 1 %
EOS PCT: 2 %
Eosinophils Absolute: 0.1 10*3/uL (ref 0.0–0.7)
HEMATOCRIT: 43.7 % (ref 36.0–46.0)
HEMOGLOBIN: 14.3 g/dL (ref 12.0–15.0)
IMMATURE GRANULOCYTES: 0 %
LYMPHS ABS: 1.3 10*3/uL (ref 0.7–4.0)
LYMPHS PCT: 27 %
MCH: 32.1 pg (ref 26.0–34.0)
MCHC: 32.7 g/dL (ref 30.0–36.0)
MCV: 98 fL (ref 78.0–100.0)
Monocytes Absolute: 0.4 10*3/uL (ref 0.1–1.0)
Monocytes Relative: 9 %
NEUTROS PCT: 61 %
Neutro Abs: 3 10*3/uL (ref 1.7–7.7)
Platelets: 191 10*3/uL (ref 150–400)
RBC: 4.46 MIL/uL (ref 3.87–5.11)
RDW: 11.9 % (ref 11.5–15.5)
WBC: 4.8 10*3/uL (ref 4.0–10.5)

## 2017-09-09 LAB — BASIC METABOLIC PANEL
Anion gap: 7 (ref 5–15)
BUN: 8 mg/dL (ref 8–23)
CO2: 25 mmol/L (ref 22–32)
Calcium: 10 mg/dL (ref 8.9–10.3)
Chloride: 106 mmol/L (ref 98–111)
Creatinine, Ser: 0.78 mg/dL (ref 0.44–1.00)
GFR calc Af Amer: >60 mL/min (ref >60)
GFR calc non Af Amer: >60 mL/min (ref >60)
Glucose, Bld: 110 mg/dL — ABNORMAL HIGH (ref 70–99)
POTASSIUM: 4.4 mmol/L (ref 3.5–5.1)
SODIUM: 138 mmol/L (ref 135–145)

## 2017-09-09 LAB — I-STAT TROPONIN, ED
TROPONIN I, POC: 0.03 ng/mL (ref 0.00–0.08)
Troponin i, poc: 0.02 ng/mL (ref 0.00–0.08)

## 2017-09-09 MED ORDER — APIXABAN 2.5 MG PO TABS: 2.5000 mg | ORAL_TABLET | Freq: Two times a day (BID) | ORAL | 0 refills | Status: DC

## 2017-09-09 MED ORDER — SODIUM CHLORIDE 0.9 % IV BOLUS
1000.0000 mL | Freq: Once | INTRAVENOUS | Status: AC
  Administered 2017-09-09: 1000 mL via INTRAVENOUS

## 2017-09-09 MED ORDER — APIXABAN 2.5 MG PO TABS
2.5000 mg | ORAL_TABLET | Freq: Once | ORAL | Status: AC
  Administered 2017-09-09: 2.5 mg via ORAL
  Filled 2017-09-09 (×2): qty 1

## 2017-09-09 NOTE — ED Triage Notes (Signed)
PT woke up because heart was racing . Pt HR pre EMS [id number] . Pt convertd on own to 62.

## 2017-09-09 NOTE — Discharge Instructions (Addendum)
Return to the ER or call 911 if you develop recurrent palpitations, chest pain, shortness of breath, vomiting, or any other new/concerning symptoms.

## 2017-09-09 NOTE — ED Provider Notes (Signed)
[facility] EMERGENCY DEPARTMENT Provider Note   CSN: [id number] Arrival date & time: 09/09/17  7829     History   Chief Complaint Chief Complaint  Patient presents with  . Palpitations    HPI [name] is a 84 y.o. female.  HPI  84 year old female presents with palpitations.  Started around 4:05 AM.  She is had this once before about a month ago.  She took a Zoloft, which she has been taking for as needed anxiety.  Still feeling poorly so EMS was called.  She has been having some chest heaviness since this started.  No shortness of breath, diaphoresis, nausea/vomiting or abdominal pain.  No leg swelling.  The patient was found to be in atrial fibrillation with RVR.  Heart rate 170-200 per EMS.  Converted on her own.  Patient has had the one prior episode but none otherwise.  She states she is been stressed a lot at home as she is been having to take care of her's husband who has been ill. Otherwise has not been ill recently, including no fever, vomiting, UTI symptoms.  Past Medical History:  Diagnosis Date  . Anxiety   . AR (allergic rhinitis)   . Basal cell carcinoma of ala nasi 2014  . Benign neoplasm stomach body   . Cataract    corrected with surgery  . Colitis   . CTS (carpal tunnel syndrome)   . Diverticulitis   . Diverticulosis   . Esophageal stricture   . Frequent headaches    likely migraine  . GERD (gastroesophageal reflux disease)   . Hepatic steatosis   . Hyperlipidemia   . Hypertension   . IBS (irritable bowel syndrome)   . Insomnia   . Internal hemorrhoids   . Osteopenia     Patient Active Problem List   Diagnosis Date Noted  . Tachycardia 08/30/2017  . Right foot pain 06/17/2017  . Health care maintenance 06/06/2017  . Advance care planning 06/06/2017  . Anxiety 06/06/2017  . Medicare annual wellness visit, subsequent 05/29/2017  . Osteopenia 10/08/2016  . Insomnia   . Frequent headaches   . IBS 12/31/2007  .  CARCINOMA, BASAL CELL, SKIN 02/22/2007  . HLD (hyperlipidemia) 02/22/2007  . GASTROESOPHAGEAL REFLUX DISEASE 02/22/2007  . CHOLECYSTECTOMY, HX OF 02/22/2007    Past Surgical History:  Procedure Laterality Date  . CATARACT EXTRACTION, BILATERAL     August 2018, October 2018  . CHOLECYSTECTOMY    . MOHS SURGERY  2014   nose     OB History    Gravida  2   Para  2   Term  0   Preterm  0   AB  0   Living  2     SAB  0   TAB  0   Ectopic  0   Multiple  0   Live Births  2            Home Medications    Prior to Admission medications   Medication Sig Start Date End Date Taking? Authorizing Provider  butalbital-acetaminophen-caffeine (FIORICET, ESGIC) 50-325-40 MG tablet Take 1 tablet by mouth 2 (two) times daily as needed. 04/11/17  Yes Tonia Ghent, MD  Cholecalciferol (VITAMIN D-3 PO) Take 10,000 Units by mouth See admin instructions. (Monday thru Friday)   Yes [provider]  dicyclomine (BENTYL) 10 MG capsule TAKE 1 CAPSULE BY MOUTH UP TO FOUR TIMESA DAY AS NEEDED FOR ABDOMINALCRAMPS 09/18/16  Yes Elsie Stain  S, MD  ezetimibe (ZETIA) 10 MG tablet Take 1 tablet 3 times a week.   Yes [provider]  hydrOXYzine (ATARAX/VISTARIL) 10 MG tablet Take 1 tablet by mouth daily. 12/30/14  Yes [provider]  meclizine (ANTIVERT) 25 MG tablet Take 25 mg by mouth 3 (three) times daily as needed. For vertigo   Yes [provider]  Multiple Vitamin (MULTIVITAMIN) tablet Take 1 tablet by mouth daily.   Yes [provider]  Probiotic Product (SOLUBLE FIBER/PROBIOTICS PO) Take by mouth.   Yes [provider]  rosuvastatin (CRESTOR) 5 MG tablet Take 0.5 tablets (2.5 mg total) by mouth daily. 09/27/16  Yes Tonia Ghent, MD  sertraline (ZOLOFT) 25 MG tablet TAKE 1 TABLET BY MOUTH EVERY DAY 08/27/17  Yes Tonia Ghent, MD  apixaban (ELIQUIS) 2.5 MG TABS tablet Take 1 tablet (2.5 mg total) by mouth 2 (two) times daily.  09/09/17   Sherwood Gambler, MD    Family History Family History  Problem Relation Age of Onset  . Cancer Other   . Diabetes Other   . CAD Brother   . Leukemia Father   . Pancreatic cancer Brother   . CAD Mother   . Breast cancer Sister   . Mesothelioma Brother   . Colon cancer Neg Hx     Social History Social History   Tobacco Use  . Smoking status: Never Smoker  . Smokeless tobacco: Never Used  Substance Use Topics  . Alcohol use: No  . Drug use: No     Allergies   Aspirin; Ciprofloxacin; Influenza vaccines; Nexium [esomeprazole magnesium]; Niacin and related; Propylene glycol; Vagifem [estradiol]; Allegra [fexofenadine]; Bacitracin-polymyxin b; Ceclor [cefaclor]; Celebrex [celecoxib]; Corticosteroids; Doxepin hcl; Erythromycin; Flagyl [metronidazole]; Hydrocortisone; Levofloxacin; Levsin [hyoscyamine sulfate]; Lidocaine; Macrobid [nitrofurantoin macrocrystal]; Marcaine [bupivacaine hcl]; Naproxen; Nsaids; Penicillins; Prevacid [lansoprazole]; Pseudoephedrine; Sulfonamide derivatives; Tavist [clemastine]; and Cortisone   Review of Systems Review of Systems  Constitutional: Negative for diaphoresis and fever.  Respiratory: Negative for shortness of breath.   Cardiovascular: Positive for chest pain and palpitations. Negative for leg swelling.  Gastrointestinal: Negative for abdominal pain, nausea and vomiting.  Genitourinary: Negative for dysuria.  Neurological: Negative for dizziness and light-headedness.  All other systems reviewed and are negative.    Physical Exam Updated Vital Signs BP (!) 149/75   Pulse 61   Temp 98.7 F (37.1 C) (Oral)   Resp 17   LMP 01/17/1988 (Approximate)   SpO2 97%   Physical Exam  Constitutional: She is oriented to person, place, and time. She appears well-developed and well-nourished. No distress.  HENT:  Head: Normocephalic and atraumatic.  Right Ear: External ear normal.  Left Ear: External ear normal.  Nose: Nose normal.    Eyes: Right eye exhibits no discharge. Left eye exhibits no discharge.  Cardiovascular: Normal rate, regular rhythm and normal heart sounds.  Pulses:      Radial pulses are 2+ on the right side, and 2+ on the left side.  Pulmonary/Chest: Effort normal and breath sounds normal.  Abdominal: Soft. She exhibits no distension. There is no tenderness.  Neurological: She is alert and oriented to person, place, and time.  Skin: Skin is warm and dry. She is not diaphoretic.  Psychiatric: Her mood appears anxious.  Nursing note and vitals reviewed.    ED Treatments / Results  Labs (all labs ordered are listed, but only abnormal results are displayed) Labs Reviewed  BASIC METABOLIC PANEL - Abnormal; Notable for the following components:  Result Value   Glucose, Bld 110 (*)    All other components within normal limits  CBC WITH DIFFERENTIAL/PLATELET  I-STAT TROPONIN, ED  I-STAT TROPONIN, ED    EKG     EKG Interpretation  Date/Time:  Sunday September 09 2017 07:27:15 EDT Ventricular Rate:  87 PR Interval:    QRS Duration: 95 QT Interval:  369 QTC Calculation: 444 R Axis:   15 Text Interpretation:  Sinus rhythm Anteroseptal infarct, age indeterminate besides PVC gone, ECG is similar to 2011 Confirmed by Sherwood Gambler [phone]) on 09/09/2017 7:35:20 AM   Radiology Dg Chest 2 View  Result Date: 09/09/2017 CLINICAL DATA:  Chest pain. EXAM: CHEST - 2 VIEW COMPARISON:  Radiograph of June 24, 2008. FINDINGS: The heart size and mediastinal contours are within normal limits. Both lungs are clear. No pneumothorax or pleural effusion is noted. The visualized skeletal structures are unremarkable. IMPRESSION: No active cardiopulmonary disease. Electronically Signed   By: Marijo Conception, M.D.   On: 09/09/2017 07:48    Procedures Procedures (including critical care time)  Medications Ordered in ED Medications  sodium chloride 0.9 % bolus 1,000 mL (0 mLs Intravenous Stopped 09/09/17 1016)   apixaban (ELIQUIS) tablet 2.5 mg (2.5 mg Oral Given 09/09/17 1009)     Initial Impression / Assessment and Plan / ED Course  I have reviewed the triage vital signs and the nursing notes.  Pertinent labs & imaging results that were available during my care of the patient were reviewed by me and considered in my medical decision making (see chart for details).  Clinical Course as of Sep 10 1542  [name] Sep 09, 2017  0730 Patient states she has minimal residual chest heaviness.  Significantly improved from when multiple EMTs were in her house as well as when she was having the palpitations.  Most likely it was related to the significant tachycardia.  However we will watch this closely and if it does not improve she may need closer ACS work-up.  As for aspirin, she states she is allergic to it and knows that for sure hurts her stomach but cannot really remember if it causes any other allergic reaction such as hives or shortness of breath.  Thus will hold off on aspirin.   [SG]  0908 Heaviness resolved.  I think this is residual related to the A. fib with RVR.  My suspicion this was ischemia is less.  I will repeat an ECG and get a second troponin.  If this is all negative, she can go home to follow-up in A. fib clinic as this all appears to be primarily A. fib related.  She will be started on Eliquis.   [SG]    Clinical Course User Index [SG] Sherwood Gambler, MD    The patient appeared to have new onset and paroxysmal atrial fibrillation. She has remained out of afib since ED arrival. The chest heaviness was probably related to the tachycardia, as it is no longer present. Given age, 2nd troponin obtained. It is also negative. Her CHA2DS2VAS score is 3, and after discussion will place on eliquis. HR in low 60s, will not put on betablocker at this time, defer this to afib clinic. D/c home with return precautions.  CHA2DS2/VAS Stroke Risk Points  Current as of about an hour ago     3 >= 2 Points: High  Risk  1 - 1.99 Points: Medium Risk  0 Points: Low Risk    The patient's score has not changed in  the past year.:  No Change     Details    This score determines the patient's risk of having a stroke if the  patient has atrial fibrillation.       Points Metrics  0 Has Congestive Heart Failure:  No    Current as of about an hour ago  0 Has Vascular Disease:  No    Current as of about an hour ago  0 Has Hypertension:  No    Current as of about an hour ago  2 Age:  32    Current as of about an hour ago  0 Has Diabetes:  No    Current as of about an hour ago  0 Had Stroke:  No  Had TIA:  No  Had thromboembolism:  No    Current as of about an hour ago  1 Female:  Yes    Current as of about an hour ago     Final Clinical Impressions(s) / ED Diagnoses   Final diagnoses:  New onset atrial fibrillation [facility])    ED Discharge Orders         Ordered    Amb referral to AFIB Clinic     09/09/17 0910    apixaban (ELIQUIS) 2.5 MG TABS tablet  2 times daily     09/09/17 1155           Sherwood Gambler, MD 09/09/17 1546

## 2017-09-10 ENCOUNTER — Telehealth (HOSPITAL_COMMUNITY): Payer: Self-pay | Admitting: *Deleted

## 2017-09-10 NOTE — Telephone Encounter (Signed)
Patient called to make appt for ER follow up per referral on her AVS - pt states she already made an appt with Dr. Wynonia Lawman on 09/18/17. She will call back if still needs to be seen after seeing Dr. Wynonia Lawman.

## 2017-09-14 ENCOUNTER — Ambulatory Visit (INDEPENDENT_AMBULATORY_CARE_PROVIDER_SITE_OTHER): Payer: Medicare Other | Admitting: Family Medicine

## 2017-09-14 ENCOUNTER — Encounter: Payer: Self-pay | Admitting: Family Medicine

## 2017-09-14 VITALS — BP 126/80 | HR 93 | Temp 97.7°F | Ht 60.0 in | Wt 132.2 lb

## 2017-09-14 DIAGNOSIS — I4891 Unspecified atrial fibrillation: Secondary | ICD-10-CM

## 2017-09-14 MED ORDER — METOPROLOL TARTRATE 25 MG PO TABS: 12.5000 mg | ORAL_TABLET | Freq: Two times a day (BID) | ORAL | 0 refills | Status: DC | PRN

## 2017-09-14 NOTE — Progress Notes (Signed)
AF. ER f/u.  No more episodes.  D/w pt about ER course and AF path/phys.    She has some nausea, unclear if from restart of sertraline vs eliquis.  No bleeding.  More likely to have that side effect with sertraline vs eliquis.  D/w pt.  See avs.    D/w pt about caffeine use.  She has been cutting back.    No CP, SOB, BLE edema.  She has f/u with Dr. Wynonia Lawman next week.    No blood in stools or urine. No black stools.   PMH and SH reviewed  ROS: Per HPI unless specifically indicated in ROS section   Meds, vitals, and allergies reviewed.   GEN: nad, alert and oriented HEENT: mucous membranes moist NECK: supple w/o LA CV: rrr.  no murmur PULM: ctab, no inc wob ABD: soft, +bs EXT: no edema SKIN: no acute rash

## 2017-09-14 NOTE — Patient Instructions (Addendum)
Try cutting the sertraline in half or skip a few days and see if the nausea gets better.   I wouldn't stop the blood thinner.   Keep the appointment with Dr. Wynonia Lawman.   Use metoprolol only if needed.  Call 911 if needed, if you have an episode that isn't resolving.   Take care.  Glad to see you.

## 2017-09-17 ENCOUNTER — Encounter: Payer: Self-pay | Admitting: Family Medicine

## 2017-09-17 DIAGNOSIS — I4891 Unspecified atrial fibrillation: Secondary | ICD-10-CM | POA: Insufficient documentation

## 2017-09-17 NOTE — Assessment & Plan Note (Signed)
Atrial fibrillation pathophysiology discussed with patient.  She is cutting back on caffeine.  She sounds to be regular right now.  No more episodes since the emergency room visit.  Discussed with her about options.  Would try cutting the sertraline in half or skip a few days and see if the nausea gets better.   I wouldn't stop Eliquis.   She will keep the appointment with Dr. Wynonia Lawman.   Use metoprolol only if needed.  Routine cautions given.  Rationale for use discussed with patient. Call 911 if needed, if having an episode that isn't resolving.  >25 minutes spent in face to face time with patient, >50% spent in counselling or coordination of care.

## 2017-09-18 ENCOUNTER — Encounter: Payer: Self-pay | Admitting: Cardiology

## 2017-09-18 DIAGNOSIS — E785 Hyperlipidemia, unspecified: Secondary | ICD-10-CM | POA: Diagnosis not present

## 2017-09-18 DIAGNOSIS — Z7901 Long term (current) use of anticoagulants: Secondary | ICD-10-CM | POA: Diagnosis not present

## 2017-09-18 DIAGNOSIS — K219 Gastro-esophageal reflux disease without esophagitis: Secondary | ICD-10-CM | POA: Diagnosis not present

## 2017-09-18 DIAGNOSIS — I480 Paroxysmal atrial fibrillation: Secondary | ICD-10-CM | POA: Diagnosis not present

## 2017-09-24 DIAGNOSIS — I480 Paroxysmal atrial fibrillation: Secondary | ICD-10-CM | POA: Diagnosis not present

## 2017-10-06 ENCOUNTER — Other Ambulatory Visit: Payer: Self-pay | Admitting: Family Medicine

## 2017-10-08 ENCOUNTER — Other Ambulatory Visit: Payer: Self-pay | Admitting: Family Medicine

## 2017-10-08 MED ORDER — APIXABAN 2.5 MG PO TABS: 2.5000 mg | ORAL_TABLET | Freq: Two times a day (BID) | ORAL | 0 refills | Status: DC

## 2017-10-08 NOTE — Telephone Encounter (Signed)
Copied from [place] [id number]. Topic: Quick Communication - See Telephone Encounter >> Oct 08, 2017  8:33 AM Conception Chancy, NT wrote: CRM for notification. See Telephone encounter for: 10/08/17.  Patient is calling and requesting a refill on apixaban (ELIQUIS) 2.5 MG TABS tablet. She states she runs out today.  [id number] [place], [place] - [address] [place]  [address] Phone: [phone] Fax: [phone]

## 2017-10-08 NOTE — Telephone Encounter (Signed)
Pt seen [name] on 09/09/17; HFU on 09/14/17. Do not see where pt was seen by Dr Wynonia Lawman.Please advise. Eliquis 2.5 mg # 60 on 09/09/17 by Dr Regenia Skeeter ED physician.Please advise.

## 2017-10-08 NOTE — Telephone Encounter (Signed)
rx sent.  Would appreciate future refills to come though Dr. Wynonia Lawman with cards.  Routed to him in the meantime.  Thanks.

## 2017-10-09 ENCOUNTER — Encounter: Payer: Self-pay | Admitting: Cardiology

## 2017-10-09 DIAGNOSIS — I480 Paroxysmal atrial fibrillation: Secondary | ICD-10-CM | POA: Diagnosis not present

## 2017-10-09 DIAGNOSIS — E785 Hyperlipidemia, unspecified: Secondary | ICD-10-CM | POA: Diagnosis not present

## 2017-10-09 DIAGNOSIS — Z7901 Long term (current) use of anticoagulants: Secondary | ICD-10-CM | POA: Diagnosis not present

## 2017-10-09 DIAGNOSIS — K219 Gastro-esophageal reflux disease without esophagitis: Secondary | ICD-10-CM | POA: Diagnosis not present

## 2017-10-09 NOTE — Progress Notes (Signed)
[name]  Date of visit:  09/18/2017 DOB:  [birth year]    Age:  84 yrs. Medical record number:  [id number]     Account number:  [id number] Primary Care Provider: Elsie Stain ____________________________ CURRENT DIAGNOSES  1. Paroxysmal atrial fibrillation  2. Long term (current) use of anticoagulants  3. Hyperlipidemia  4. Gastro-esophageal reflux disease without esophagitis ____________________________ ALLERGIES  Aleve, Intolerance-unknown  Allegra, Intolerance-unknown  Anaprox, Intolerance-unknown  Aspirin, Intolerance-unknown  Bacitracin, Intolerance-unknown  Benadryl, Intolerance-unknown  Bentyl, Intolerance-unknown  Bupivacaine, Intolerance-unknown  Ceclor, Intolerance-unknown  Cefaclor, Intolerance-unknown  Celebrex, Intolerance-unknown  Cipro, Intolerance-unknown  Clemastine, Intolerance-unknown  Cortisone, Intolerance-unknown  Doxepin, Intolerance-unknown  Erythromycin Base, Intolerance-unknown  Estradiol, Intolerance-unknown  Flagyl, Intolerance-unknown  Hydrocodone, Intolerance-unknown  Hyoscyamine, Intolerance-unknown  Ibuprofen, Intolerance-unknown  Influenza Virus Vaccines, Intolerance-unknown  Kenalog, Intolerance-unknown  Lansoprazole, Intolerance-unknown  Levaquin, Intolerance-unknown  Levsin, Intolerance-unknown  Lexapro, Intolerance-unknown  Lidocaine, Intolerance-unknown  Macrobid, Intolerance-unknown  Marcaine, Intolerance-unknown  Metronidazole, Intolerance-unknown  Naproxen, Intolerance-unknown  Nexium, Intolerance-unknown  Niacin, Intolerance-unknown  NSAIDS, Intolerance-unknown  Penicillins, Intolerance-unknown  Policosanol, Intolerance-unknown  Polysporin, Intolerance-unknown  Propylene Glycol, Intolerance-unknown  Sinequan, Intolerance-unknown  Sudafed, Intolerance-unknown  Sulfa (Sulfonamide Antibiotics), Intolerance-unknown  Tavist, Intolerance-unknown  Vagifem, Intolerance-unknown  Zelnorm,  Intolerance-unknown ____________________________ MEDICATIONS  1. dicyclomine 10 mg capsule, PRN  2. Eliquis 2.5 mg tablet, BID  3. Fiorinal 50-325-40 mg Capsule, PRN  4. hydroxyzine HCl 10 mg tablet, PRN  5. meclizine 25 mg tablet, PRN  6. multivitamin tablet, PRN  7. rosuvastatin 5 mg tablet, 1/2 tab daily  8. sertraline 25 mg tablet, 1 p.o. daily  9. Vitamin D 50,000 unit Capsule, weekly  10. Zetia 10 mg Tablet, 1 p.o. daily ____________________________ CHIEF COMPLAINTS  Arrhythmia ____________________________ HISTORY OF PRESENT ILLNESS  This 84 year old female is seen for evaluation of paroxysmal atrial fibrillation. She has been under situational stress and noted that she had been having some palpitations for about a month. She developed rapid palpitations associated with chest pain and shortness of breath and was taken by EMS to [facility] on 8/25. She  was noted to be in atrial fibrillation in the ambulance but converted spontaneously to sinus rhythm prior to arrival. Troponins were negative. She had a unremarkable chest x-ray and negative troponins and was started on Eliquis and asked to followup with me. She has not had recurrence of palpitations since then. She saw her primary doctor who gave her a prescription for metoprolol to take on an as needed basis. She is quite anxious. She has noted some mild nausea since she started on her medications but is unclear whether this is due to sertraline or to Eliquis. She complains of fatigue related to taking care of her elderly husband. She denies angina and has no PND, orthopnea or edema. ____________________________ PAST HISTORY  Past Medical Illnesses:  hyperlipidemia, GERD, anxiety, diverticulosis, irritable bowel syndrome;  Cardiovascular Illnesses:  no previous history of cardiac disease.;  Surgical Procedures:  cholecystectomy;  NYHA Classification:  I;  Canadian Angina Classification:  Class 0: Asymptomatic;  Cardiology  Procedures-Invasive:  cardiac cath (left);  Cardiology Procedures-Noninvasive:  echocardiogram, adenosine cardiolite June 2010, echocardiogram July 2011;  LVEF not documented,   ____________________________ Caleen Essex TEST DATES EKG Date:  09/18/2017;   Cardiac Cath Date:  06/20/1995;  Nuclear Study Date:  07/09/2008;  Echocardiography Date: 07/21/2009;   ____________________________ FAMILY HISTORY Brother -- Brother dead Brother -- Brother dead Brother -- Brother dead Father -- Father dead, Leukemia Mother -- Mother dead, Stroke Sister -- Sister alive and well ____________________________ SOCIAL  HISTORY Alcohol Use:  no alcohol use;  Smoking:  never smoked;  Diet:  regular diet;  Lifestyle:  married and 2 daughters;  Exercise:  exercise is limited due to physical disability;  Occupation:  homemaker and sold [employer];  Residence:  lives with husband;   ____________________________ REVIEW OF SYSTEMS General:  malaise and fatigue  Integumentary:chronic skin rash Eyes: denies diplopia, history of glaucoma or visual problems., cataract extraction bilaterally Ears, Nose, Throat, Mouth:  denies any hearing loss, epistaxis, hoarseness or difficulty speaking. Respiratory: denies dyspnea, cough, wheezing or hemoptysis. Cardiovascular:  please review HPI Abdominal: constipation, diarrhea and history of irritable bowel syndromeGenitourinary-Female: no dysuria, urgency, frequency, UTIs, or stress incontinence Musculoskeletal:  arthritis of the right knee Neurological:  denies headaches, stroke, or TIA Psychiatric:  situational stress  ____________________________ PHYSICAL EXAMINATION VITAL SIGNS  Blood Pressure:  108/70 Sitting, Left arm, regular cuff  , 102/70 Standing, Left arm and regular cuff   Pulse:  60/min. Weight:  132.00 lbs. Height:  61"BMI: 25  Constitutional:  pleasant white female, in no acute distress Skin:  warm and dry to touch, no apparent skin lesions, or masses noted. Head:   normocephalic, normal hair pattern, no masses or tenderness Eyes:  EOMS Intact, PERRLA, C and S clear, Funduscopic exam not done. ENT:  ears, nose and throat reveal no gross abnormalities.  Dentition good. Neck:  supple, without massess. No JVD, thyromegaly or carotid bruits. Carotid upstroke normal. Chest:  normal symmetry, clear to auscultation and percussion. Cardiac:  regular rhythm, normal S1 and S2, No S3 or S4, no murmurs, gallops or rubs detected. Abdomen:  abdomen soft,non-tender, no masses, no hepatospenomegaly, or aneurysm noted Peripheral Pulses:  the femoral,dorsalis pedis, and posterior tibial pulses are full and equal bilaterally with no bruits auscultated. Extremities & Back:  no deformities, clubbing, cyanosis, erythema or edema observed. Normal muscle strength and tone. Neurological:  no gross motor or sensory deficits noted, affect appropriate, oriented x3. ____________________________ MOST RECENT LIPID PANEL 05/29/17  CHOL TOTL 249 mg/dl, LDL 164 NM, HDL 49.5 mg/dl and TRIGLYCER 176 mg/dl ____________________________ IMPRESSIONS/PLAN  1. Paroxysmal atrial fibrillation currently in sinus rhythm  CHA2DS2VASC score 3 2. Long-term use of anticoagulation 3. Hyperlipidemia with statin intolerance previously  Recommendations:  She will need to have an Echocardiogram done. Education regarding atrial fibrillation done. Twelve-lead EKG personally reviewed shows sinus rhythm. She appears to be doing relatively well at this time and recommended followup in 3 weeks. She should continue anticoagulation and to use metoprolol as needed for paroxysms of atrial fibrillation.  Greater than 65 minutes spent with greater than 50% of the time spent face-to-face including counseling coordination of care as well as review of records regarding paroxysmal atrial fibrillation anticoagulation and its complications and management and options for management of atrial  fibrillation. ____________________________ TODAYS ORDERS  1. 2D, color flow, doppler: At Patient Convenience  2. 12 Lead EKG: Today  3. Return Visit: 3 weeks                       ____________________________ Cardiology Physician:  Kerry Hough MD [facility]

## 2017-10-09 NOTE — Telephone Encounter (Signed)
Patient notified as instructed by telephone. Patient stated that she just saw Dr. Wynonia Lawman this morning and he told her that he is getting ready to go out for surgery and not sure how low he will be out. Patient said that Dr. Wynonia Lawman said that he is going to send you a note about it.

## 2017-10-09 NOTE — Progress Notes (Signed)
[name]  Date of visit:  10/09/2017 DOB:  12-22-1932    Age:  84 yrs. Medical record number:  [id number]     Account number:  [id number] Primary Care Provider: Elsie Stain ____________________________ CURRENT DIAGNOSES  1. Paroxysmal atrial fibrillation  2. Long term (current) use of anticoagulants  3. Hyperlipidemia  4. Gastro-esophageal reflux disease without esophagitis ____________________________ ALLERGIES  Aleve, Intolerance-unknown  Allegra, Intolerance-unknown  Anaprox, Intolerance-unknown  Aspirin, Intolerance-unknown  Bacitracin, Intolerance-unknown  Benadryl, Intolerance-unknown  Bentyl, Intolerance-unknown  Bupivacaine, Intolerance-unknown  Ceclor, Intolerance-unknown  Cefaclor, Intolerance-unknown  Celebrex, Intolerance-unknown  Cipro, Intolerance-unknown  Clemastine, Intolerance-unknown  Cortisone, Intolerance-unknown  Doxepin, Intolerance-unknown  Erythromycin Base, Intolerance-unknown  Estradiol, Intolerance-unknown  Flagyl, Intolerance-unknown  Hydrocodone, Intolerance-unknown  Hyoscyamine, Intolerance-unknown  Ibuprofen, Intolerance-unknown  Influenza Virus Vaccines, Intolerance-unknown  Kenalog, Intolerance-unknown  Lansoprazole, Intolerance-unknown  Levaquin, Intolerance-unknown  Levsin, Intolerance-unknown  Lexapro, Intolerance-unknown  Lidocaine, Intolerance-unknown  Macrobid, Intolerance-unknown  Marcaine, Intolerance-unknown  Metronidazole, Intolerance-unknown  Naproxen, Intolerance-unknown  Nexium, Intolerance-unknown  Niacin, Intolerance-unknown  NSAIDS, Intolerance-unknown  Penicillins, Intolerance-unknown  Policosanol, Intolerance-unknown  Polysporin, Intolerance-unknown  Propylene Glycol, Intolerance-unknown  Sinequan, Intolerance-unknown  Sudafed, Intolerance-unknown  Sulfa (Sulfonamide Antibiotics), Intolerance-unknown  Tavist, Intolerance-unknown  Vagifem, Intolerance-unknown  Zelnorm,  Intolerance-unknown ____________________________ MEDICATIONS  1. dicyclomine 10 mg capsule, PRN  2. Eliquis 2.5 mg tablet, BID  3. Fiorinal 50-325-40 mg Capsule, PRN  4. hydroxyzine HCl 10 mg tablet, PRN  5. meclizine 25 mg tablet, PRN  6. multivitamin tablet, PRN  7. rosuvastatin 5 mg tablet, 1/2 tab daily  8. sertraline 25 mg tablet, 1 p.o. daily  9. Vitamin D 50,000 unit Capsule, weekly  10. Zetia 10 mg Tablet, 1 p.o. daily ____________________________ CHIEF COMPLAINTS  Followup of Long term (current) use of anticoagulants  Followup of Paroxysmal atrial fibrillation ____________________________ HISTORY OF PRESENT ILLNESS  Patient returns for cardiac followup. She has not had recurrence of atrial fibrillation since she was here. She was in the emergency room on the 25th of August. Her echocardiogram showed moderate mitral regurgitation and mild her gastric regurgitation with a moderately dilated left atrium. She has been using Eliquis for anticoagulation. She denies PND, orthopnea or edema. Her nausea has improved since she was here. ____________________________ PAST HISTORY  Past Medical Illnesses:  hyperlipidemia, GERD, anxiety, diverticulosis, irritable bowel syndrome;  Cardiovascular Illnesses:  no previous history of cardiac disease.;  Surgical Procedures:  cholecystectomy;  NYHA Classification:  I;  Canadian Angina Classification:  Class 0: Asymptomatic;  Cardiology Procedures-Invasive:  cardiac cath (left);  Cardiology Procedures-Noninvasive:  echocardiogram, adenosine cardiolite June 2010, echocardiogram July 2011, echocardiogram September 2019, event monitor September 2019;  LVEF of 55% documented via echocardiogram on 09/24/2017,   ____________________________ CARDIO-PULMONARY TEST DATES EKG Date:  09/18/2017;   Cardiac Cath Date:  06/20/1995;  Nuclear Study Date:  07/09/2008;  Echocardiography Date: 09/24/2017;   ____________________________ FAMILY HISTORY Brother -- Brother  dead Brother -- Brother dead Brother -- Brother dead Father -- Father dead, Leukemia Mother -- Mother dead, Stroke Sister -- Sister alive and well ____________________________ SOCIAL HISTORY Alcohol Use:  no alcohol use;  Smoking:  never smoked;  Diet:  regular diet;  Lifestyle:  married and 2 daughters;  Exercise:  exercise is limited due to physical disability;  Occupation:  homemaker and sold [employer];  Residence:  lives with husband;   ____________________________ REVIEW OF SYSTEMS General:  malaise and fatigue Eyes: denies diplopia, history of glaucoma or visual problems., cataract extraction bilaterally Respiratory: denies dyspnea, cough, wheezing or hemoptysis. Cardiovascular:  please review HPI Genitourinary-Female:  no dysuria, urgency, frequency, UTIs, or stress incontinence Musculoskeletal:  arthritis of the right knee Neurological:  denies headaches, stroke, or TIA Psychiatric:  situational stress  ____________________________ PHYSICAL EXAMINATION VITAL SIGNS  Blood Pressure:  124/74 Sitting, Left arm, regular cuff  , 114/80 Standing, Left arm and regular cuff   Pulse:  70/min. Weight:  134.00 lbs. Height:  61"BMI: 25  Constitutional:  pleasant white female, in no acute distress Skin:  warm and dry to touch, no apparent skin lesions, or masses noted. Head:  normocephalic, normal hair pattern, no masses or tenderness Chest:  normal symmetry, clear to auscultation. Cardiac:  regular rhythm, normal S1 and S2, No S3 or S4, no murmurs, gallops or rubs detected. Abdomen:  abdomen soft,non-tender, no masses, no hepatospenomegaly, or aneurysm noted Peripheral Pulses:  the femoral,dorsalis pedis, and posterior tibial pulses are full and equal bilaterally with no bruits auscultated. Extremities & Back:  no deformities, clubbing, cyanosis, erythema or edema observed. Normal muscle strength and tone. Neurological:  no gross motor or sensory deficits noted, affect appropriate, oriented  x3. ____________________________ MOST RECENT LIPID PANEL 05/29/17  CHOL TOTL 249 mg/dl, LDL 164 NM, HDL 49.5 mg/dl and TRIGLYCER 176 mg/dl ____________________________ IMPRESSIONS/PLAN  1. Paroxysmal atrial fibrillation currently in sinus rhythm 2. Mild hypertensive heart disease with normal LV function 3. Long-term use of anticoagulation  Recommendations:  At the present time she is clinically in sinus rhythm. I told her that I was to be out an extended medical leave and would likely be best for her to be followed by one of the electrophysiologists. I will send him a note.                       ____________________________ Cardiology Physician:  Kerry Hough MD [facility]

## 2017-10-10 MED ORDER — APIXABAN 2.5 MG PO TABS: 2.5000 mg | ORAL_TABLET | Freq: Two times a day (BID) | ORAL | 5 refills | Status: DC

## 2017-10-10 NOTE — Telephone Encounter (Signed)
Noted. Refills sent.  Thanks.

## 2017-10-16 ENCOUNTER — Telehealth (HOSPITAL_COMMUNITY): Payer: Self-pay | Admitting: *Deleted

## 2017-10-16 NOTE — Telephone Encounter (Signed)
-----   Message from Thompson Grayer, MD sent at 10/16/2017 10:00 AM EDT ----- Regarding: FW: a fib referral  Please schedule  Thanks  ----- Message ----- From: Jacolyn Reedy, MD Sent: 10/09/2017   6:04 PM EDT To: Thompson Grayer, MD Subject: a fib referral                                 Jeneen Rinks,  This lady would prob best be seen in followup either by you or someone in the a fib clinic.  I sent the ECHO to you and office will call.   Thanks,  [place]

## 2017-10-16 NOTE — Telephone Encounter (Signed)
Called to schedule appointment - patient stated she would prefer her PCP make a recommendation/referral for cardiology follow-up.

## 2017-10-19 ENCOUNTER — Telehealth: Payer: Self-pay | Admitting: *Deleted

## 2017-10-19 NOTE — Telephone Encounter (Signed)
Patient says that her Cardiologist, Dr. Wynonia Lawman, is retiring.  Patient would like your opinion on who she should see.  Dr. Wynonia Lawman was in practice alone so there is no one else in the practice with whom to transfer.

## 2017-10-21 NOTE — Telephone Encounter (Signed)
I have confidence across the board in the [facility].  Let me know if she needs a referral.  Thanks.

## 2017-10-22 NOTE — Telephone Encounter (Signed)
Patient advised and will call if a referral is needed.

## 2017-11-12 ENCOUNTER — Telehealth: Payer: Self-pay | Admitting: Obstetrics and Gynecology

## 2017-11-12 ENCOUNTER — Other Ambulatory Visit: Payer: Self-pay

## 2017-11-12 ENCOUNTER — Ambulatory Visit (INDEPENDENT_AMBULATORY_CARE_PROVIDER_SITE_OTHER): Payer: Medicare Other | Admitting: Obstetrics and Gynecology

## 2017-11-12 ENCOUNTER — Encounter: Payer: Self-pay | Admitting: Obstetrics and Gynecology

## 2017-11-12 VITALS — BP 136/84 | HR 70 | Ht 60.25 in | Wt 133.6 lb

## 2017-11-12 DIAGNOSIS — N644 Mastodynia: Secondary | ICD-10-CM

## 2017-11-12 DIAGNOSIS — M545 Low back pain, unspecified: Secondary | ICD-10-CM

## 2017-11-12 DIAGNOSIS — R319 Hematuria, unspecified: Secondary | ICD-10-CM

## 2017-11-12 LAB — POCT URINALYSIS DIPSTICK
Bilirubin, UA: NEGATIVE
Glucose, UA: NEGATIVE
Ketones, UA: NEGATIVE
Nitrite, UA: NEGATIVE
PROTEIN UA: NEGATIVE
UROBILINOGEN UA: 0.2 U/dL
pH, UA: 5 (ref 5.0–8.0)

## 2017-11-12 NOTE — Telephone Encounter (Signed)
MMG report to Dr. Quincy Simmonds for review.   Routing to provider for final review. Patient is agreeable to disposition. Will close encounter.

## 2017-11-12 NOTE — Telephone Encounter (Signed)
Call to [facility]. Requested copy of recent MMG report be faxed to [facility]. Last MMG March 2019.

## 2017-11-12 NOTE — Telephone Encounter (Signed)
Patient is having right breast pain. Last seen 01/29/15 with Edman Circle.

## 2017-11-12 NOTE — Progress Notes (Signed)
GYNECOLOGY  VISIT   HPI: 84 y.o.   Married  Caucasian  female   [id number] with Patient's last menstrual period was 01/17/1988 (approximate).   here for right breast pain x 1 week.   Lifts everything as her husband is not well.  No trauma to the area.   Notes some right sided back and hip pain long term.  Asking for urine check.  No dysuria.  No hematuria.  Has some urinary frequency which is not new for her.   Urine dip - 1+ RBC, 1+ WBCs.  GYNECOLOGIC HISTORY: Patient's last menstrual period was 01/17/1988 (approximate). Contraception: Postmenopausal Menopausal hormone therapy: none Last mammogram:03/29/2017 3D Neg/density B/BiRads2 Last pap smear: 11-29-09 Neg        OB History    Gravida  2   Para  2   Term  0   Preterm  0   AB  0   Living  2     SAB  0   TAB  0   Ectopic  0   Multiple  0   Live Births  2              Patient Active Problem List   Diagnosis Date Noted  . Atrial fibrillation ([place]) 09/17/2017  . Tachycardia 08/30/2017  . Right foot pain 06/17/2017  . Health care maintenance 06/06/2017  . Advance care planning 06/06/2017  . Anxiety 06/06/2017  . Medicare annual wellness visit, subsequent 05/29/2017  . Osteopenia 10/08/2016  . Insomnia   . Frequent headaches   . IBS 12/31/2007  . CARCINOMA, BASAL CELL, SKIN 02/22/2007  . HLD (hyperlipidemia) 02/22/2007  . GASTROESOPHAGEAL REFLUX DISEASE 02/22/2007  . CHOLECYSTECTOMY, HX OF 02/22/2007    Past Medical History:  Diagnosis Date  . Anxiety   . AR (allergic rhinitis)   . Atrial fibrillation ([place])   . Basal cell carcinoma of ala nasi 2014  . Benign neoplasm stomach body   . Cataract    corrected with surgery  . Colitis   . CTS (carpal tunnel syndrome)   . Diverticulitis   . Diverticulosis   . Esophageal stricture   . Frequent headaches    likely migraine  . GERD (gastroesophageal reflux disease)   . Hepatic steatosis   . Hyperlipidemia   . Hypertension   . IBS  (irritable bowel syndrome)   . Insomnia   . Internal hemorrhoids   . Osteopenia     Past Surgical History:  Procedure Laterality Date  . CATARACT EXTRACTION, BILATERAL     August 2018, October 2018  . CHOLECYSTECTOMY    . MOHS SURGERY  2014   nose    Current Outpatient Medications  Medication Sig Dispense Refill  . apixaban (ELIQUIS) 2.5 MG TABS tablet Take 1 tablet (2.5 mg total) by mouth 2 (two) times daily. 60 tablet 5  . butalbital-acetaminophen-caffeine (FIORICET, ESGIC) 50-325-40 MG tablet Take 1 tablet by mouth 2 (two) times daily as needed. 100 tablet 1  . Cholecalciferol (VITAMIN D-3 PO) Take 10,000 Units by mouth See admin instructions. (Monday thru Friday)    . dicyclomine (BENTYL) 10 MG capsule TAKE 1 CAPSULE BY MOUTH UP TO FOUR TIMESA DAY AS NEEDED FOR ABDOMINALCRAMPS 100 capsule 1  . ezetimibe (ZETIA) 10 MG tablet Take 1 tablet 3 times a week.    . hydrOXYzine (ATARAX/VISTARIL) 10 MG tablet Take 1 tablet by mouth daily.    . meclizine (ANTIVERT) 25 MG tablet Take 25 mg by mouth 3 (three) times daily as  needed. For vertigo    . metoprolol tartrate (LOPRESSOR) 25 MG tablet TAKE 0.5 TABLETS (12.5 MG TOTAL) BY MOUTH 2 (TWO) TIMES DAILY AS NEEDED. 30 tablet 7  . Multiple Vitamin (MULTIVITAMIN) tablet Take 1 tablet by mouth daily.    . Probiotic Product (SOLUBLE FIBER/PROBIOTICS PO) Take by mouth.    . rosuvastatin (CRESTOR) 5 MG tablet Take 0.5 tablets (2.5 mg total) by mouth daily. 45 tablet 1   No current facility-administered medications for this visit.      ALLERGIES: Aspirin; Ciprofloxacin; Influenza vaccines; Nexium [esomeprazole magnesium]; Niacin and related; Propylene glycol; Vagifem [estradiol]; Allegra [fexofenadine]; Bacitracin-polymyxin b; Ceclor [cefaclor]; Celebrex [celecoxib]; Corticosteroids; Doxepin hcl; Erythromycin; Flagyl [metronidazole]; Hydrocortisone; Levofloxacin; Levsin [hyoscyamine sulfate]; Lidocaine; Macrobid [nitrofurantoin macrocrystal];  Marcaine [bupivacaine hcl]; Naproxen; Nsaids; Penicillins; Prevacid [lansoprazole]; Pseudoephedrine; Sulfonamide derivatives; Tavist [clemastine]; and Cortisone  Family History  Problem Relation Age of Onset  . Cancer Other   . Diabetes Other   . CAD Brother   . Leukemia Father   . Pancreatic cancer Brother   . CAD Mother   . Breast cancer Sister   . Mesothelioma Brother   . Colon cancer Neg Hx     Social History   Socioeconomic History  . Marital status: Married    Spouse name: Not on file  . Number of children: Not on file  . Years of education: Not on file  . Highest education level: Not on file  Occupational History  . Not on file  Social Needs  . Financial resource strain: Not on file  . Food insecurity:    Worry: Not on file    Inability: Not on file  . Transportation needs:    Medical: Not on file    Non-medical: Not on file  Tobacco Use  . Smoking status: Never Smoker  . Smokeless tobacco: Never Used  Substance and Sexual Activity  . Alcohol use: No  . Drug use: No  . Sexual activity: Not Currently    Partners: Male  Lifestyle  . Physical activity:    Days per week: Not on file    Minutes per session: Not on file  . Stress: Not on file  Relationships  . Social connections:    Talks on phone: Not on file    Gets together: Not on file    Attends religious service: Not on file    Active member of club or organization: Not on file    Attends meetings of clubs or organizations: Not on file    Relationship status: Not on file  . Intimate partner violence:    Fear of current or ex partner: Not on file    Emotionally abused: Not on file    Physically abused: Not on file    Forced sexual activity: Not on file  Other Topics Concern  . 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  All other systems  reviewed and are negative.   PHYSICAL EXAMINATION:    BP 136/84 (BP Location: Right Arm, Patient Position: Sitting, Cuff Size: Normal)   Pulse 70   Ht 5' 0.25" (1.53 m)   Wt 133 lb 9.6 oz (60.6 kg)   LMP 01/17/1988 (Approximate)   BMI 25.88 kg/m     General appearance: alert, cooperative and appears stated age 9: normocephalic, atraumatic.  Lungs: clear to auscultation bilaterally Breasts: Left - normal appearance, no masses or tenderness, No nipple retraction or dimpling, No nipple discharge or bleeding, No axillary or supraclavicular adenopathy Right - normal appearance, no mass.  Tenderness noted at 4:00 peripheral breast. No nipple retraction or dimpling, No nipple discharge or bleeding, No axillary or supraclavicular adenopathy Heart: regular rate and rhythm   Chaperone was present for exam.  ASSESSMENT  Right breast pain at 4:00.  No mass.  Sister with breast cancer.  Right back pain.  Abnormal urine dip - microscopic hematuria.  Multiple allergies.  Atrial fibrillation.  On Eliquis.   PLAN  We discussed chest wall pain/breast pain.  Will get a dx right mammogram and right breast US at [facility].  If dx studies are normal and pain persists, to PCP.  Urine micro and culture.  Return for annual exam and prn.   An After Visit Summary was printed and given to the patient.  _15_____ minutes face to face time of which over 50% was spent in counseling.

## 2017-11-12 NOTE — Progress Notes (Signed)
Patient scheduled while in office for right breast Dx MMG and [place], if needed, on 11/22/17 at 1:30pm. Order faxed to [facility]. Patient verbalizes understanding and is agreeable.

## 2017-11-12 NOTE — Telephone Encounter (Signed)
Spoke with patient. Patient reports right breast pain for 1 wk. Denies any recent injury,lumps, nipple d/c or skin changes.   Last screening MMG at [facility] "first of year 2019".   Last AEX with [facility] 01/29/15. Patient states she has had AEX with PCP.   OV scheduled for today at 11am with Dr. Quincy Simmonds. Will call [facility] for copy of recent MMG to update chart. Patient verbalizes understanding.

## 2017-11-13 LAB — URINALYSIS, MICROSCOPIC ONLY: Casts: NONE SEEN /lpf

## 2017-11-13 LAB — URINE CULTURE

## 2017-11-16 DIAGNOSIS — L814 Other melanin hyperpigmentation: Secondary | ICD-10-CM | POA: Diagnosis not present

## 2017-11-16 DIAGNOSIS — L817 Pigmented purpuric dermatosis: Secondary | ICD-10-CM | POA: Diagnosis not present

## 2017-11-16 DIAGNOSIS — L821 Other seborrheic keratosis: Secondary | ICD-10-CM | POA: Diagnosis not present

## 2017-11-16 DIAGNOSIS — D1801 Hemangioma of skin and subcutaneous tissue: Secondary | ICD-10-CM | POA: Diagnosis not present

## 2017-11-16 DIAGNOSIS — L918 Other hypertrophic disorders of the skin: Secondary | ICD-10-CM | POA: Diagnosis not present

## 2017-11-16 DIAGNOSIS — D2272 Melanocytic nevi of left lower limb, including hip: Secondary | ICD-10-CM | POA: Diagnosis not present

## 2017-11-16 DIAGNOSIS — D224 Melanocytic nevi of scalp and neck: Secondary | ICD-10-CM | POA: Diagnosis not present

## 2017-11-16 DIAGNOSIS — Z85828 Personal history of other malignant neoplasm of skin: Secondary | ICD-10-CM | POA: Diagnosis not present

## 2017-11-16 DIAGNOSIS — L570 Actinic keratosis: Secondary | ICD-10-CM | POA: Diagnosis not present

## 2017-11-16 DIAGNOSIS — D225 Melanocytic nevi of trunk: Secondary | ICD-10-CM | POA: Diagnosis not present

## 2017-11-19 ENCOUNTER — Encounter: Payer: Self-pay | Admitting: Cardiology

## 2017-11-19 ENCOUNTER — Ambulatory Visit (INDEPENDENT_AMBULATORY_CARE_PROVIDER_SITE_OTHER): Payer: Medicare Other | Admitting: Cardiology

## 2017-11-19 VITALS — BP 125/72 | HR 67 | Ht 60.0 in | Wt 133.0 lb

## 2017-11-19 DIAGNOSIS — Z7901 Long term (current) use of anticoagulants: Secondary | ICD-10-CM | POA: Diagnosis not present

## 2017-11-19 DIAGNOSIS — E782 Mixed hyperlipidemia: Secondary | ICD-10-CM | POA: Diagnosis not present

## 2017-11-19 DIAGNOSIS — I480 Paroxysmal atrial fibrillation: Secondary | ICD-10-CM

## 2017-11-19 NOTE — Progress Notes (Signed)
Cardiology Office Note:    Date:  11/19/2017   ID:  [name], DOB Dec 31, 1932, MRN [id number]  PCP:  Tonia Ghent, MD  Cardiologist:  Buford Dresser, MD PhD (Dr. Wynonia Lawman)  Referring MD: Tonia Ghent, MD   CC: follow up of paroxysmal atrial fibrillation  History of Present Illness:    [name] is a 84 y.o. female with a hx of paroxysmal atrial fibrillation on anticoagulation, hyperlipidemia who is seen as a new consult at the request of Tonia Ghent, MD for the evaluation and management of atrial fibrillation.  Has felt well overall. Very symptomatic with her afib; had an episode in July and another in August. Tolerating apixaban; meets age cirteria and right at the bubble of weight cutoff of 60 kg. No bleeding issues. No chest pain, shortness of breath, PND, orthopnea, LE edema. Trying to be active, but her husband's health has not been ideal and is the limiting factor in her activity level.  Past Medical History:  Diagnosis Date  . Anxiety   . AR (allergic rhinitis)   . Atrial fibrillation ([place])   . Basal cell carcinoma of ala nasi 2014  . Benign neoplasm stomach body   . Cataract    corrected with surgery  . Colitis   . CTS (carpal tunnel syndrome)   . Diverticulitis   . Diverticulosis   . Esophageal stricture   . Frequent headaches    likely migraine  . GERD (gastroesophageal reflux disease)   . Hepatic steatosis   . Hyperlipidemia   . Hypertension   . IBS (irritable bowel syndrome)   . Insomnia   . Internal hemorrhoids   . Osteopenia     Past Surgical History:  Procedure Laterality Date  . CATARACT EXTRACTION, BILATERAL     August 2018, October 2018  . CHOLECYSTECTOMY    . MOHS SURGERY  2014   nose    Current Medications: Current Outpatient Medications on File Prior to Visit  Medication Sig  . apixaban (ELIQUIS) 2.5 MG TABS tablet Take 1 tablet (2.5 mg total) by mouth 2 (two) times daily.  .  butalbital-acetaminophen-caffeine (FIORICET, ESGIC) 50-325-40 MG tablet Take 1 tablet by mouth 2 (two) times daily as needed.  . Cholecalciferol (VITAMIN D-3 PO) Take 10,000 Units by mouth See admin instructions. (Monday thru Friday)  . dicyclomine (BENTYL) 10 MG capsule TAKE 1 CAPSULE BY MOUTH UP TO FOUR TIMESA DAY AS NEEDED FOR ABDOMINALCRAMPS  . ezetimibe (ZETIA) 10 MG tablet Take 1 tablet 3 times a week.  . hydrOXYzine (ATARAX/VISTARIL) 10 MG tablet Take 1 tablet by mouth daily.  . meclizine (ANTIVERT) 25 MG tablet Take 25 mg by mouth 3 (three) times daily as needed. For vertigo  . metoprolol tartrate (LOPRESSOR) 25 MG tablet TAKE 0.5 TABLETS (12.5 MG TOTAL) BY MOUTH 2 (TWO) TIMES DAILY AS NEEDED.  . Multiple Vitamin (MULTIVITAMIN) tablet Take 1 tablet by mouth daily.  . Probiotic Product (SOLUBLE FIBER/PROBIOTICS PO) Take by mouth.  . rosuvastatin (CRESTOR) 5 MG tablet Take 0.5 tablets (2.5 mg total) by mouth daily.   No current facility-administered medications on file prior to visit.      Allergies:   Aspirin; Ciprofloxacin; Influenza vaccines; Nexium [esomeprazole magnesium]; Niacin and related; Propylene glycol; Vagifem [estradiol]; Allegra [fexofenadine]; Bacitracin-polymyxin b; Ceclor [cefaclor]; Celebrex [celecoxib]; Corticosteroids; Doxepin hcl; Erythromycin; Flagyl [metronidazole]; Hydrocortisone; Levofloxacin; Levsin [hyoscyamine sulfate]; Lidocaine; Macrobid [nitrofurantoin macrocrystal]; Marcaine [bupivacaine hcl]; Naproxen; Nsaids; Penicillins; Prevacid [lansoprazole]; Pseudoephedrine; Sulfonamide derivatives; Tavist [clemastine]; and Cortisone  Social History   Socioeconomic History  . Marital status: Married    Spouse name: Not on file  . Number of children: Not on file  . Years of education: Not on file  . Highest education level: Not on file  Occupational History  . Not on file  Social Needs  . Financial resource strain: Not on file  . Food insecurity:    Worry:  Not on file    Inability: Not on file  . Transportation needs:    Medical: Not on file    Non-medical: Not on file  Tobacco Use  . Smoking status: Never Smoker  . Smokeless tobacco: Never Used  Substance and Sexual Activity  . Alcohol use: No  . Drug use: No  . Sexual activity: Not Currently    Partners: Male  Lifestyle  . Physical activity:    Days per week: Not on file    Minutes per session: Not on file  . Stress: Not on file  Relationships  . Social connections:    Talks on phone: Not on file    Gets together: Not on file    Attends religious service: Not on file    Active member of club or organization: Not on file    Attends meetings of clubs or organizations: Not on file    Relationship status: Not on file  Other Topics Concern  . 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 There is no history of Colon cancer.  ROS:   Please see the history of present illness.  Additional pertinent ROS:  Constitutional: Negative for chills, fever, night sweats, unintentional weight loss  HENT: Negative for ear pain and hearing loss.   Eyes: Negative for loss of vision and eye pain.  Respiratory: Negative for cough, sputum, shortness of breath, wheezing.   Cardiovascular: Negative for chest pain, palpitations , PND, orthopnea, lower extremity edema and claudication.  Gastrointestinal: Negative for abdominal pain, melena, and hematochezia.  Genitourinary: Negative for dysuria and hematuria.  Musculoskeletal: Negative for falls and myalgias.  Skin: Negative for itching and rash.  Neurological: Negative for focal weakness, focal sensory changes and  loss of consciousness.  Endo/Heme/Allergies: Does not bruise/bleed easily.    EKGs/Labs/Other Studies Reviewed:    The following studies were reviewed today: Echo 09/24/17 per Dr. Thurman Coyer notes: Mild concentric LVH with grade 1 diastolic dysfunction, EF 27%. Moderate MR, mild TR, trace PR.  EKG:  EKG is ordered today.  The ekg ordered today demonstrates normal sinus rhythm, borderline low voltage, questionable old septal infarct.  Recent Labs: 08/28/2017: ALT 11; TSH 1.98 09/09/2017: BUN 8; Creatinine, Ser 0.78; Hemoglobin 14.3; Platelets 191; Potassium 4.4; Sodium 138  Recent Lipid Panel    Component Value Date/Time   CHOL 249 (H) 05/29/2017 0828   CHOL 165 10/14/2015   TRIG 176.0 (H) 05/29/2017 0828   TRIG 115 10/14/2015   HDL 49.50 05/29/2017 0828   HDL 51 10/14/2015   CHOLHDL 5 05/29/2017 0828   VLDL 35.2 05/29/2017 0828   LDLCALC 164 (H) 05/29/2017 0828   LDLCALC 91 10/14/2015    Physical Exam:  VS:  BP 125/72   Pulse 67   Ht 5' (1.524 m)   Wt 133 lb (60.3 kg)   LMP 01/17/1988 (Approximate)   BMI 25.97 kg/m     Wt Readings from Last 3 Encounters:  11/19/17 133 lb (60.3 kg)  11/12/17 133 lb 9.6 oz (60.6 kg)  09/14/17 132 lb 4 oz (60 kg)     GEN: Well nourished, well developed in no acute distress HEENT: Normal NECK: No JVD; No carotid bruits LYMPHATICS: No lymphadenopathy CARDIAC: regular rhythm, normal S1 and S2, no murmurs, rubs, gallops. Radial and DP pulses 2+ bilaterally. RESPIRATORY:  Clear to auscultation without rales, wheezing or rhonchi  ABDOMEN: Soft, non-tender, non-distended MUSCULOSKELETAL:  No edema; No deformity  SKIN: Warm and dry NEUROLOGIC:  Alert and oriented x 3 PSYCHIATRIC:  Normal affect   ASSESSMENT:    1. Paroxysmal atrial fibrillation (HCC)   2. Mixed hyperlipidemia   3. Encounter for current long-term use of anticoagulants    PLAN:    1. Paroxysmal atrial fibrillation: on apixaban (just on the border for dose reduction)  and rate control with metoprolol. Very symptomatic with her afib.   CHA2DS2/VAS Stroke Risk Points =3   Points Metrics  0 Has Congestive Heart Failure:  No   0 Has Vascular Disease:  No   0 Has Hypertension:  No   2 Age:  75   0 Has Diabetes:  No   0 Had Stroke:  No  Had TIA:  No  Had thromboembolism:  No   1 Female:  Yes      2. Hyperlipidemia: most recent LDL 164. On rosuvastatin and zetia. As this is primary prevention, given her age, would not increase intensity at this time, but since she is tolerating regimen will continue zetia and rosuvastatin.  Plan for follow up: 6 mos  Medication Adjustments/Labs and Tests Ordered: Current medicines are reviewed at length with the patient today.  Concerns regarding medicines are outlined above.  Orders Placed This Encounter  Procedures  . EKG 12-Lead   No orders of the defined types were placed in this encounter.   Patient Instructions  Medication Instructions:  Your Physician recommend you continue on your current medication as directed.   If you need a refill on your cardiac medications before your next appointment, please call your pharmacy.   Lab work: None   Testing/Procedures: None  Follow-Up: At [facility], you and your health needs are our priority.  As part of our continuing mission to provide you with exceptional heart care, we have created designated Provider Care Teams.  These Care Teams include your primary Cardiologist (physician) and Advanced Practice Providers (APPs -  Physician Assistants and Nurse Practitioners) who all work together to provide you with the care you need, when you need it. You will need a follow up appointment in 6 months.  Please call our office 2 months in advance to schedule this appointment.  You may see Dr. Harrell Gave or one of the following Advanced Practice Providers on your designated Care Team:   Rosaria Ferries, PA-C . Jory Sims, DNP, ANP  Any Other Special Instructions Will  Be Listed Below (If Applicable).       Signed, Buford Dresser, MD PhD 11/19/2017 10:11 PM    [place]

## 2017-11-19 NOTE — Patient Instructions (Signed)
Medication Instructions:  Your Physician recommend you continue on your current medication as directed.    If you need a refill on your cardiac medications before your next appointment, please call your pharmacy.   Lab work: None   Testing/Procedures: None  Follow-Up: At [facility], you and your health needs are our priority.  As part of our continuing mission to provide you with exceptional heart care, we have created designated Provider Care Teams.  These Care Teams include your primary Cardiologist (physician) and Advanced Practice Providers (APPs -  Physician Assistants and Nurse Practitioners) who all work together to provide you with the care you need, when you need it. You will need a follow up appointment in 6 months.  Please call our office 2 months in advance to schedule this appointment.  You may see Dr. Christopher or one of the following Advanced Practice Providers on your designated Care Team:   Rhonda Barrett, PA-C . Kathryn Lawrence, DNP, ANP  Any Other Special Instructions Will Be Listed Below (If Applicable).    

## 2017-11-21 DIAGNOSIS — H01001 Unspecified blepharitis right upper eyelid: Secondary | ICD-10-CM | POA: Diagnosis not present

## 2017-11-21 DIAGNOSIS — H01004 Unspecified blepharitis left upper eyelid: Secondary | ICD-10-CM | POA: Diagnosis not present

## 2017-11-22 DIAGNOSIS — N644 Mastodynia: Secondary | ICD-10-CM | POA: Diagnosis not present

## 2017-11-22 LAB — HM MAMMOGRAPHY

## 2017-11-29 ENCOUNTER — Encounter: Payer: Self-pay | Admitting: Family Medicine

## 2018-01-01 ENCOUNTER — Encounter: Payer: Self-pay | Admitting: Obstetrics and Gynecology

## 2018-01-02 ENCOUNTER — Other Ambulatory Visit: Payer: Self-pay | Admitting: *Deleted

## 2018-01-02 MED ORDER — ROSUVASTATIN CALCIUM 5 MG PO TABS: 2.5000 mg | ORAL_TABLET | Freq: Every day | ORAL | 1 refills | Status: DC

## 2018-01-02 MED ORDER — DICYCLOMINE HCL 10 MG PO CAPS: ORAL_CAPSULE | ORAL | 1 refills | Status: DC

## 2018-01-02 NOTE — Addendum Note (Signed)
Addended by: Emelia Salisbury C on: 01/02/2018 05:53 PM   Modules accepted: Orders

## 2018-01-02 NOTE — Telephone Encounter (Signed)
Faxed refill request  Last office visit 09/14/17 Rosuvastatin last refill 09/27/16 #45/1 Bentyl last refill 09/18/16 #100/1  Spoke to patient and was advised that she had stopped her cholesterol medication, but has started that back.

## 2018-01-02 NOTE — Addendum Note (Signed)
Addended by: Tonia Ghent on: 01/02/2018 10:50 PM   Modules accepted: Orders

## 2018-01-02 NOTE — Telephone Encounter (Signed)
Sent. Thanks.   

## 2018-01-03 ENCOUNTER — Telehealth: Payer: Self-pay | Admitting: *Deleted

## 2018-01-03 NOTE — Telephone Encounter (Signed)
Notation made in medication list for current pharmacy.

## 2018-01-03 NOTE — Telephone Encounter (Signed)
Pt contacted office and states she has switched to [facility] and they will have all meds transferred from [facility] to them. Wanted to send as FYI to [place].

## 2018-01-21 ENCOUNTER — Other Ambulatory Visit: Payer: Self-pay

## 2018-01-21 ENCOUNTER — Other Ambulatory Visit (HOSPITAL_COMMUNITY)
Admission: RE | Admit: 2018-01-21 | Discharge: 2018-01-21 | Disposition: A | Discharge status: Home / Self Care | Payer: Medicare Other | Source: Ambulatory Visit | Attending: Obstetrics and Gynecology | Admitting: Obstetrics and Gynecology

## 2018-01-21 ENCOUNTER — Ambulatory Visit (INDEPENDENT_AMBULATORY_CARE_PROVIDER_SITE_OTHER): Payer: Medicare Other | Admitting: Obstetrics and Gynecology

## 2018-01-21 ENCOUNTER — Encounter: Payer: Self-pay | Admitting: Obstetrics and Gynecology

## 2018-01-21 VITALS — BP 140/82 | HR 60 | Resp 18 | Ht 60.0 in | Wt 133.0 lb

## 2018-01-21 DIAGNOSIS — Z01419 Encounter for gynecological examination (general) (routine) without abnormal findings: Secondary | ICD-10-CM | POA: Diagnosis not present

## 2018-01-21 DIAGNOSIS — Z124 Encounter for screening for malignant neoplasm of cervix: Secondary | ICD-10-CM

## 2018-01-21 NOTE — Patient Instructions (Signed)

## 2018-01-21 NOTE — Progress Notes (Signed)
85 y.o. G54P0002 Married Caucasian female here for annual exam.    Thinks her breast pain was exacerbated from lifting over her washing machine.  Voiding more often.   No vaginal bleeding, vaginal discharge, or GI problems.   Husband is needing additional care at home.  Daughter lives next door.   PCP: Elsie Stain, MD   Patient's last menstrual period was 01/17/1988 (approximate).           Sexually active: No.  The current method of family planning is post menopausal status.    Exercising: No.  The patient does not participate in regular exercise at present. Smoker:  no  Health Maintenance: Pap: 11-29-09 Neg History of abnormal Pap:  no MMG:  03-29-17 3D Neg/benign intramammary node Lt.Br./density B/Birads1.  11/22/17 - normal dx right mammogram, Bi-RADS2.  Colonoscopy:02/02/12, Diverticulosis, no repeat due to age    BMD:  2017  Result :with PCP TDaP:  With PCP Gardasil:   no HIV:not indicated Hep C: not indicated Screening Labs:  ---   reports that she has never smoked. She has never used smokeless tobacco. She reports that she does not drink alcohol or use drugs.  Past Medical History:  Diagnosis Date  . Anxiety   . AR (allergic rhinitis)   . Atrial fibrillation ([place])   . Basal cell carcinoma of ala nasi 2014  . Benign neoplasm stomach body   . Cataract    corrected with surgery  . Colitis   . CTS (carpal tunnel syndrome)   . Diverticulitis   . Diverticulosis   . Esophageal stricture   . Frequent headaches    likely migraine  . GERD (gastroesophageal reflux disease)   . Hepatic steatosis   . Hyperlipidemia   . Hypertension   . IBS (irritable bowel syndrome)   . Insomnia   . Internal hemorrhoids   . Osteopenia     Past Surgical History:  Procedure Laterality Date  . CATARACT EXTRACTION, BILATERAL     August 2018, October 2018  . CHOLECYSTECTOMY    . MOHS SURGERY  2014   nose    Current Outpatient Medications  Medication Sig Dispense Refill  .  apixaban (ELIQUIS) 2.5 MG TABS tablet Take 1 tablet (2.5 mg total) by mouth 2 (two) times daily. 60 tablet 5  . butalbital-acetaminophen-caffeine (FIORICET, ESGIC) 50-325-40 MG tablet Take 1 tablet by mouth 2 (two) times daily as needed. 100 tablet 1  . Cholecalciferol (VITAMIN D-3 PO) Take 10,000 Units by mouth See admin instructions. (Monday thru Friday)    . dicyclomine (BENTYL) 10 MG capsule TAKE 1 CAPSULE BY MOUTH UP TO FOUR TIMESA DAY AS NEEDED FOR ABDOMINALCRAMPS 100 capsule 1  . ezetimibe (ZETIA) 10 MG tablet Take 1 tablet 3 times a week.    . hydrOXYzine (ATARAX/VISTARIL) 10 MG tablet Take 1 tablet by mouth daily.    . meclizine (ANTIVERT) 25 MG tablet Take 25 mg by mouth 3 (three) times daily as needed. For vertigo    . metoprolol tartrate (LOPRESSOR) 25 MG tablet TAKE 0.5 TABLETS (12.5 MG TOTAL) BY MOUTH 2 (TWO) TIMES DAILY AS NEEDED. 30 tablet 7  . Multiple Vitamin (MULTIVITAMIN) tablet Take 1 tablet by mouth daily.    . Probiotic Product (SOLUBLE FIBER/PROBIOTICS PO) Take by mouth.    . rosuvastatin (CRESTOR) 5 MG tablet Take 0.5 tablets (2.5 mg total) by mouth daily. 45 tablet 1   No current facility-administered medications for this visit.     Family History  Problem Relation  Age of Onset  . Cancer Other   . Diabetes Other   . CAD Brother   . Leukemia Father   . Pancreatic cancer Brother   . CAD Mother   . Breast cancer Sister   . Mesothelioma Brother   . Colon cancer Neg Hx     Review of Systems  Gastrointestinal: Positive for constipation and diarrhea.  Musculoskeletal:       Muscle and joint aches  All other systems reviewed and are negative.   Exam:   BP 140/82 (BP Location: Right Arm, Patient Position: Sitting, Cuff Size: Normal)   Pulse 60   Resp 18   Ht 5' (1.524 m)   Wt 133 lb (60.3 kg)   LMP 01/17/1988 (Approximate)   BMI 25.97 kg/m     General appearance: alert, cooperative and appears stated age Head: Normocephalic, without obvious abnormality,  atraumatic Neck: no adenopathy, supple, symmetrical, trachea midline and thyroid normal to inspection and palpation Lungs: clear to auscultation bilaterallyBreasts: normal appearance, no masses or tenderness, No nipple retraction or dimpling, No nipple discharge or bleeding, No axillary or supraclavicular adenopathy Heart: regular rate and rhythm Abdomen: soft, non-tender; no masses, no organomegaly Extremities: extremities normal, atraumatic, no cyanosis or edema Skin: Skin color, texture, turgor normal. No rashes or lesions Lymph nodes: Cervical, supraclavicular, and axillary nodes normal. No abnormal inguinal nodes palpated Neurologic: Grossly normal  Pelvic: External genitalia:  no lesions              Urethra:  normal appearing urethra with no masses, tenderness or lesions              Bartholins and Skenes: normal                 Vagina: normal appearing vagina with normal color and discharge, no lesions              Cervix: no lesions              Pap taken: Yes.   Bimanual Exam:  Uterus:  normal size, contour, position, consistency, mobility, non-tender              Adnexa: no mass, fullness, tenderness              Rectal exam: Yes.  .  Confirms.              Anus:  normal sphincter tone, no lesions  Chaperone was present for exam.  Assessment:   Well woman visit with normal exam. Hx afib. Osteoporosis.  PCP following.  Back pain.   Plan: Mammogram screening. Recommended self breast awareness. Pap and HR HPV as above. Guidelines for Calcium, Vitamin D, regular exercise program including cardiovascular and weight bearing exercise. BMD is due.  She will see her PCP regarding her back pain.  We talked about assisted living to have additional support for the care of her husband if needed during the day and night.  Follow up annually and prn.   After visit summary provided.

## 2018-01-22 LAB — CYTOLOGY - PAP: DIAGNOSIS: NEGATIVE

## 2018-01-31 ENCOUNTER — Ambulatory Visit (INDEPENDENT_AMBULATORY_CARE_PROVIDER_SITE_OTHER): Payer: Medicare Other | Admitting: Family Medicine

## 2018-01-31 ENCOUNTER — Encounter: Payer: Self-pay | Admitting: Family Medicine

## 2018-01-31 DIAGNOSIS — M545 Low back pain, unspecified: Secondary | ICD-10-CM

## 2018-01-31 NOTE — Patient Instructions (Addendum)
Likely a recurrent muscle strain that is tightening up at night.   Try sleeping in a different bed to see if that helps.  Massage and heat are a good idea.  Ice may help.  Use the handout with the stretches and we can set you up with PT if needed.   Take care.  Glad to see you.

## 2018-01-31 NOTE — Progress Notes (Signed)
No recent metoprolol use.  Still on eliquis.    Back pain.  Going on for months.  More pain in the AM, when trying to get out of bed.  R lower back pain, not midline.  Has been sitting on a back massager with some relief, but needs it 2-3 times a day.  Used ice with some relief.  No radicular pain pain.  No L sided pain.  No FCNAVD.  No dysuria.  No B/B sx.  No trauma.  She has daily pain but not all day of everyday.    Meds, vitals, and allergies reviewed.   ROS: Per HPI unless specifically indicated in ROS section   GEN: nad, alert and oriented HEENT: mucous membranes moist NECK: supple w/o LA CV: rrr. PULM: ctab, no inc wob ABD: soft, +bs EXT: no edema SKIN: Well-perfused, no acute rash Right lower back minimally tender to palpation Straight leg raise negative bilaterally. Midline back not tender to palpation Able to bear weight

## 2018-02-03 DIAGNOSIS — M545 Low back pain, unspecified: Secondary | ICD-10-CM | POA: Insufficient documentation

## 2018-02-03 NOTE — Assessment & Plan Note (Signed)
Likely a recurrent muscle strain that is tightening up at night.  She can try sleeping in a different bed to see if that helps.  Massage and heat are a good idea.  Ice may help.  Use the handout with the stretches and we can set her up with PT if needed.   Handout discussed with patient.

## 2018-02-18 DIAGNOSIS — H10413 Chronic giant papillary conjunctivitis, bilateral: Secondary | ICD-10-CM | POA: Diagnosis not present

## 2018-03-12 DIAGNOSIS — H1132 Conjunctival hemorrhage, left eye: Secondary | ICD-10-CM | POA: Diagnosis not present

## 2018-03-12 DIAGNOSIS — S0502XA Injury of conjunctiva and corneal abrasion without foreign body, left eye, initial encounter: Secondary | ICD-10-CM | POA: Diagnosis not present

## 2018-04-23 ENCOUNTER — Other Ambulatory Visit: Payer: Self-pay

## 2018-04-23 ENCOUNTER — Ambulatory Visit (INDEPENDENT_AMBULATORY_CARE_PROVIDER_SITE_OTHER): Payer: Medicare Other | Admitting: Family Medicine

## 2018-04-23 ENCOUNTER — Encounter: Payer: Self-pay | Admitting: Family Medicine

## 2018-04-23 VITALS — BP 122/70 | HR 82 | Temp 97.6°F | Ht 61.0 in | Wt 134.1 lb

## 2018-04-23 DIAGNOSIS — R109 Unspecified abdominal pain: Secondary | ICD-10-CM | POA: Diagnosis not present

## 2018-04-23 LAB — COMPREHENSIVE METABOLIC PANEL
ALT: 11 U/L (ref 0–35)
AST: 13 U/L (ref 0–37)
Albumin: 4.3 g/dL (ref 3.5–5.2)
Alkaline Phosphatase: 95 U/L (ref 39–117)
BUN: 13 mg/dL (ref 6–23)
CO2: 27 mEq/L (ref 19–32)
Calcium: 11.5 mg/dL — ABNORMAL HIGH (ref 8.4–10.5)
Chloride: 101 mEq/L (ref 96–112)
Creatinine, Ser: 0.82 mg/dL (ref 0.40–1.20)
GFR: 66.48 mL/min (ref >60.00)
Glucose, Bld: 108 mg/dL — ABNORMAL HIGH (ref 70–99)
Potassium: 4.9 mEq/L (ref 3.5–5.1)
Sodium: 135 mEq/L (ref 135–145)
Total Bilirubin: 1.1 mg/dL (ref 0.2–1.2)
Total Protein: 7.4 g/dL (ref 6.0–8.3)

## 2018-04-23 LAB — CBC WITH DIFFERENTIAL/PLATELET
Basophils Absolute: 0 10*3/uL (ref 0.0–0.1)
Basophils Relative: 0.3 % (ref 0.0–3.0)
Eosinophils Absolute: 0 10*3/uL (ref 0.0–0.7)
Eosinophils Relative: 0.3 % (ref 0.0–5.0)
HCT: 41.4 % (ref 36.0–46.0)
Hemoglobin: 14.2 g/dL (ref 12.0–15.0)
Lymphocytes Relative: 11.6 % — ABNORMAL LOW (ref 12.0–46.0)
Lymphs Abs: 1.2 10*3/uL (ref 0.7–4.0)
MCHC: 34.4 g/dL (ref 30.0–36.0)
MCV: 97.1 fl (ref 78.0–100.0)
Monocytes Absolute: 1.1 10*3/uL — ABNORMAL HIGH (ref 0.1–1.0)
Monocytes Relative: 10.1 % (ref 3.0–12.0)
Neutro Abs: 8.4 10*3/uL — ABNORMAL HIGH (ref 1.4–7.7)
Neutrophils Relative %: 77.7 % — ABNORMAL HIGH (ref 43.0–77.0)
Platelets: 210 10*3/uL (ref 150.0–400.0)
RBC: 4.26 Mil/uL (ref 3.87–5.11)
RDW: 13 % (ref 11.5–15.5)
WBC: 10.8 10*3/uL — ABNORMAL HIGH (ref 4.0–10.5)

## 2018-04-23 LAB — LIPASE: Lipase: 6 U/L — ABNORMAL LOW (ref 11.0–59.0)

## 2018-04-23 MED ORDER — ROSUVASTATIN CALCIUM 5 MG PO TABS: 5.0000 mg | ORAL_TABLET | ORAL | Status: DC

## 2018-04-23 NOTE — Patient Instructions (Signed)
Go to the lab on the way out.  We'll contact you with your lab report. Clear liquid diet for now.  Update me tomorrow, either way.  If you clearly feel better tomorrow, then gradually add back low fiber foods, crackers, potatoes, pasta, etc.  If clearly worse in the meantime, then go to the ER.  Check back over your allergy list to see if you can provide any extra information about true allergies vs relative intolerances.   Take care.  Glad to see you.

## 2018-04-23 NOTE — Assessment & Plan Note (Addendum)
>  25 minutes spent in face to face time with patient, >50% spent in counselling or coordination of care.  We have talked about her allergy list prev, re: possible intolerances vs allergies to prev meds. Discussed again.  I don't have clarity from the patient about the specific reactions she had to some of the listed meds.    She could have mild case of diverticulitis but she could have a transient and not ominous issue (diarrhea with hemorrhoid irritation) that will self resolve. D/w pt.    Given that she is some better at this point, given her possible med allergies, given the pandemic and the need to defer extra imaging at this point, and since she doesn't appear in distress, will manage conservatively for now.  D/w pt.   Check labs now.  Clear liquid diet for now.  Update me tomorrow, either way.  If clearly better tomorrow, then gradually add back low fiber foods, crackers, potatoes, pasta, etc.  If clearly worse in the meantime, then go to the ER.   She agrees with plan.

## 2018-04-23 NOTE — Progress Notes (Signed)
We have talked about her allergy list prev, re: possible intolerances vs allergies to prev meds. Discussed again.  I don't have clarity from the patient about the specific reactions she had to some of the listed meds.    Sx started yesterday.  She had diarrhea yesterday, mult times.  Took dicyclomine yesterday several times.  Persistent abd pain yesterday.  Used a heating pad.  She has passed some blood and some mucous.  She got up 3 times in the middle of the night for a BM.  She has h/o diverticulitis and hemorrhoid irritation in the past.  No fevers. No vomiting.  Some nausea this AM but not now.    Last solid food was baked potato and meatloaf last night.  Some toast this AM.  Taking fluids.  Normal UOP.  No burning with urination.    Currently with lower abd pain, uncomfortable but not severe.  3/10 pain now.    PMH and SH reviewed  ROS: Per HPI unless specifically indicated in ROS section   Meds, vitals, and allergies reviewed.   GEN: nad, alert and oriented HEENT: in mask at [place].   NECK: supple w/o LA CV: rrr. PULM: ctab, no inc wob ABD: soft, +bs, slightly ttp near the umbilicus w/o rebound.   EXT: no edema SKIN: well perfused.

## 2018-04-24 ENCOUNTER — Telehealth: Payer: Self-pay

## 2018-04-24 DIAGNOSIS — R109 Unspecified abdominal pain: Secondary | ICD-10-CM

## 2018-04-24 NOTE — Telephone Encounter (Signed)
Many thanks. 

## 2018-04-24 NOTE — Telephone Encounter (Signed)
Pt seen 04/23/18; pt feeling better today not hurting as bad and still not 100% but pain level now for lower abd pain is 3.no loose stools; no N&V or fever. Today pt has eaten 1 piece of toast,baked potato & crackers and drank 1 c of coffee and tea. Pt has not cked allergy list yet; only thing pt knows if takes abx she gets nauseated. FYI to Dr Damita Dunnings.

## 2018-04-24 NOTE — Telephone Encounter (Signed)
Pt notified as instructed and pt voiced understanding. Pt scheduled lab appt at back of office building for 05/01/18 at 10:30. Pt will cb if needed. FYI to Dr Damita Dunnings.

## 2018-04-24 NOTE — Telephone Encounter (Signed)
Thanks for getting an update on patient.  I routed you the result note with the same message below.  Please update her.   Many thanks.   Shaw ===========   Liver and renal labs are fine.  Calcium was elevated but this may be a false elevation.  Lipase is normal.  WBC is minimally elevated.    As long as she is feeling better, then I would hold off on abx.  When her pain is clearly better, then slowly advance her diet.  Update me as needed about her abd pain.   I would defer abx and imaging if her pain is getting better and if she doesn't have a fever.   I would recheck her BMET and CBC next week to make sure they have normalized.  I put in the orders.   Thanks.

## 2018-05-01 ENCOUNTER — Other Ambulatory Visit (INDEPENDENT_AMBULATORY_CARE_PROVIDER_SITE_OTHER): Payer: Medicare Other

## 2018-05-01 ENCOUNTER — Other Ambulatory Visit: Payer: Medicare Other

## 2018-05-01 DIAGNOSIS — R109 Unspecified abdominal pain: Secondary | ICD-10-CM

## 2018-05-01 LAB — CBC WITH DIFFERENTIAL/PLATELET
Basophils Absolute: 0 10*3/uL (ref 0.0–0.1)
Basophils Relative: 0.7 % (ref 0.0–3.0)
Eosinophils Absolute: 0.1 10*3/uL (ref 0.0–0.7)
Eosinophils Relative: 2.1 % (ref 0.0–5.0)
HCT: 40.8 % (ref 36.0–46.0)
Hemoglobin: 13.8 g/dL (ref 12.0–15.0)
Lymphocytes Relative: 27.7 % (ref 12.0–46.0)
Lymphs Abs: 1.2 10*3/uL (ref 0.7–4.0)
MCHC: 33.9 g/dL (ref 30.0–36.0)
MCV: 97.4 fl (ref 78.0–100.0)
Monocytes Absolute: 0.4 10*3/uL (ref 0.1–1.0)
Monocytes Relative: 8.7 % (ref 3.0–12.0)
Neutro Abs: 2.6 10*3/uL (ref 1.4–7.7)
Neutrophils Relative %: 60.8 % (ref 43.0–77.0)
Platelets: 260 10*3/uL (ref 150.0–400.0)
RBC: 4.19 Mil/uL (ref 3.87–5.11)
RDW: 12.7 % (ref 11.5–15.5)
WBC: 4.3 10*3/uL (ref 4.0–10.5)

## 2018-05-01 LAB — BASIC METABOLIC PANEL
BUN: 8 mg/dL (ref 6–23)
CO2: 28 mEq/L (ref 19–32)
Calcium: 10.5 mg/dL (ref 8.4–10.5)
Chloride: 103 mEq/L (ref 96–112)
Creatinine, Ser: 0.78 mg/dL (ref 0.40–1.20)
GFR: 70.43 mL/min (ref >60.00)
Glucose, Bld: 110 mg/dL — ABNORMAL HIGH (ref 70–99)
Potassium: 3.8 mEq/L (ref 3.5–5.1)
Sodium: 139 mEq/L (ref 135–145)

## 2018-05-06 ENCOUNTER — Other Ambulatory Visit: Payer: Self-pay | Admitting: Family Medicine

## 2018-05-20 ENCOUNTER — Telehealth: Payer: Self-pay | Admitting: Cardiovascular Disease

## 2018-05-20 NOTE — Telephone Encounter (Signed)
VIDEO/Doximity visit on 05/24/2018     Phone Call to obtain consent    -  05/20/2018         Virtual Visit Pre-Appointment Phone Call  "(Name), I am calling you today to discuss your upcoming appointment. We are currently trying to limit exposure to the virus that causes COVID-19 by seeing patients at home rather than in the office."  1. "What is the BEST phone number to call the day of the visit?" - include this in appointment notes  2. Do you have or have access to (through a family member/friend) a smartphone with video capability that we can use for your visit?" a. If yes - list this number in appt notes as cell (if different from BEST phone #) and list the appointment type as a VIDEO visit in appointment notes b. If no - list the appointment type as a PHONE visit in appointment notes  3. Confirm consent - "In the setting of the current Covid19 crisis, you are scheduled for a (phone or video) visit with your provider on (date) at (time).  Just as we do with many in-office visits, in order for you to participate in this visit, we must obtain consent.  If you'd like, I can send this to your mychart (if signed up) or email for you to review.  Otherwise, I can obtain your verbal consent now.  All virtual visits are billed to your insurance company just like a normal visit would be.  By agreeing to a virtual visit, we'd like you to understand that the technology does not allow for your provider to perform an examination, and thus may limit your provider's ability to fully assess your condition. If your provider identifies any concerns that need to be evaluated in person, we will make arrangements to do so.  Finally, though the technology is pretty good, we cannot assure that it will always work on either your or our end, and in the setting of a video visit, we may have to convert it to a phone-only visit.  In either situation, we cannot ensure that we have a secure connection.  Are you  willing to proceed?" STAFF: Did the patient verbally acknowledge consent to telehealth visit? Document YES/NO here: YES  4. Advise patient to be prepared - "Two hours prior to your appointment, go ahead and check your blood pressure, pulse, oxygen saturation, and your weight (if you have the equipment to check those) and write them all down. When your visit starts, your provider will ask you for this information. If you have an Apple Watch or Kardia device, please plan to have heart rate information ready on the day of your appointment. Please have a pen and paper handy nearby the day of the visit as well."  5. Give patient instructions for MyChart download to smartphone OR Doximity/Doxy.me as below if video visit (depending on what platform provider is using)  6. Inform patient they will receive a phone call 15 minutes prior to their appointment time (may be from unknown caller ID) so they should be prepared to answer    TELEPHONE CALL NOTE  [name] has been deemed a candidate for a follow-up tele-health visit to limit community exposure during the Covid-19 pandemic. I spoke with the patient via phone to ensure availability of phone/video source, confirm preferred email & phone number, and discuss instructions and expectations.  I reminded [name] to be prepared with any vital sign and/or heart rhythm  information that could potentially be obtained via home monitoring, at the time of her visit. I reminded [name] to expect a phone call prior to her visit.  [name] 05/20/2018 2:29 PM   INSTRUCTIONS FOR DOWNLOADING THE MYCHART APP TO SMARTPHONE  - The patient must first make sure to have activated MyChart and know their login information - If Apple, go to [employer] and type in MyChart in the search bar and download the app. If Android, ask patient to go to [employer] and type in Guy in the search bar and download the app. The app is free but as with  any other app downloads, their phone may require them to verify saved payment information or Apple/Android password.  - The patient will need to then log into the app with their MyChart username and password, and select [facility] as their healthcare provider to link the account. When it is time for your visit, go to the MyChart app, find appointments, and click Begin Video Visit. Be sure to Select Allow for your device to access the Microphone and Camera for your visit. You will then be connected, and your provider will be with you shortly.  **If they have any issues connecting, or need assistance please contact MyChart service desk [phone])**  **If using a computer, in order to ensure the best quality for their visit they will need to use either of the following Internet Browsers: [employer], or Google Chrome**  IF USING DOXIMITY or DOXY.ME - The patient will receive a link just prior to their visit by text.     FULL LENGTH CONSENT FOR TELE-HEALTH VISIT   I hereby voluntarily request, consent and authorize [facility] and its employed or contracted physicians, physician assistants, nurse practitioners or other licensed health care professionals (the Practitioner), to provide me with telemedicine health care services (the Services") as deemed necessary by the treating Practitioner. I acknowledge and consent to receive the Services by the Practitioner via telemedicine. I understand that the telemedicine visit will involve communicating with the Practitioner through live audiovisual communication technology and the disclosure of certain medical information by electronic transmission. I acknowledge that I have been given the opportunity to request an in-person assessment or other available alternative prior to the telemedicine visit and am voluntarily participating in the telemedicine visit.  I understand that I have the right to withhold or withdraw my consent to the use of  telemedicine in the course of my care at any time, without affecting my right to future care or treatment, and that the Practitioner or I may terminate the telemedicine visit at any time. I understand that I have the right to inspect all information obtained and/or recorded in the course of the telemedicine visit and may receive copies of available information for a reasonable fee.  I understand that some of the potential risks of receiving the Services via telemedicine include:   Delay or interruption in medical evaluation due to technological equipment failure or disruption;  Information transmitted may not be sufficient (e.g. poor resolution of images) to allow for appropriate medical decision making by the Practitioner; and/or   In rare instances, security protocols could fail, causing a breach of personal health information.  Furthermore, I acknowledge that it is my responsibility to provide information about my medical history, conditions and care that is complete and accurate to the best of my ability. I acknowledge that Practitioner's advice, recommendations, and/or decision may be based on factors not  within their control, such as incomplete or inaccurate data provided by me or distortions of diagnostic images or specimens that may result from electronic transmissions. I understand that the practice of medicine is not an exact science and that Practitioner makes no warranties or guarantees regarding treatment outcomes. I acknowledge that I will receive a copy of this consent concurrently upon execution via email to the email address I last provided but may also request a printed copy by calling the office of [facility].    I understand that my insurance will be billed for this visit.   I have read or had this consent read to me.  I understand the contents of this consent, which adequately explains the benefits and risks of the Services being provided via telemedicine.   I have been  provided ample opportunity to ask questions regarding this consent and the Services and have had my questions answered to my satisfaction.  I give my informed consent for the services to be provided through the use of telemedicine in my medical care  By participating in this telemedicine visit I agree to the above.

## 2018-05-24 ENCOUNTER — Telehealth (INDEPENDENT_AMBULATORY_CARE_PROVIDER_SITE_OTHER): Payer: Medicare Other | Admitting: Cardiovascular Disease

## 2018-05-24 ENCOUNTER — Other Ambulatory Visit: Payer: Self-pay

## 2018-05-24 ENCOUNTER — Encounter: Payer: Self-pay | Admitting: Cardiovascular Disease

## 2018-05-24 VITALS — BP 128/71 | HR 62 | Ht 61.0 in | Wt 133.0 lb

## 2018-05-24 DIAGNOSIS — I340 Nonrheumatic mitral (valve) insufficiency: Secondary | ICD-10-CM | POA: Diagnosis not present

## 2018-05-24 DIAGNOSIS — I480 Paroxysmal atrial fibrillation: Secondary | ICD-10-CM | POA: Diagnosis not present

## 2018-05-24 DIAGNOSIS — E782 Mixed hyperlipidemia: Secondary | ICD-10-CM

## 2018-05-24 NOTE — Patient Instructions (Addendum)
Medication Instructions:  Your provider recommends that you continue on your current medications as directed. Please refer to the Current Medication list given to you today.   If you need a refill on your cardiac medications before your next appointment, please call your pharmacy.   Follow-Up: At [facility], you and your health needs are our priority.  As part of our continuing mission to provide you with exceptional heart care, we have created designated Provider Care Teams.  These Care Teams include your primary Cardiologist (physician) and Advanced Practice Providers (APPs -  Physician Assistants and Nurse Practitioners) who all work together to provide you with the care you need, when you need it. You will need a follow up appointment in 6 months.  Please call our office 2 months in advance to schedule this appointment.  You may see Dr. Angelena Form or one of the following Advanced Practice Providers on your designated Care Team:   [place], PA-C Melina Copa, PA-C . Ermalinda Barrios, PA-C

## 2018-05-24 NOTE — Progress Notes (Signed)
Virtual Visit via Video Note   This visit type was conducted due to national recommendations for restrictions regarding the COVID-19 Pandemic (e.g. social distancing) in an effort to limit this patient's exposure and mitigate transmission in our community.  Due to her co-morbid illnesses, this patient is at least at moderate risk for complications without adequate follow up.  This format is felt to be most appropriate for this patient at this time.  All issues noted in this document were discussed and addressed.  A limited physical exam was performed with this format.  Please refer to the patient's chart for her consent to telehealth for [facility].   Date:  05/24/2018   ID:  [name], DOB Dec 12, 1932, MRN [id number]  Patient Location: Home Provider Location: Office  PCP:  Tonia Ghent, MD  Cardiologist:  Buford Dresser, MD  Electrophysiologist:  None   Evaluation Performed:  Follow-Up Visit  Chief Complaint:  Follow up-Atrial fib  History of Present Illness:    [name] is a 85 y.o. female with history of paroxysmal atrial fibrillation on anticoagulation and hyperlipidemia who is being seen today by virtual e-visit due to the Covid19 pandemic. She was seen as a new patient for initial evaluation on November 2019 by Dr. Buford Dresser. She had been seen previously by Dr Wynonia Lawman. Echo in his office in September 2019 showed normal LV systolic function with JTTS=17% with moderate MR. She was in sinus on the day of her first visit here. She was continue on Lopressor and Eliquis. She was felt to be appropriate for 5 mg of Eliquis BID. CHADS VASC score 3. She has asked to change to my clinic due to the fact that I take care of her husband.   She has no chest pain, dyspnea, palpitations, dizziness or lower extremity edema.   The patient does not have symptoms concerning for COVID-19 infection (fever, chills, cough, or new shortness of breath).     Past Medical History:  Diagnosis Date  . Anxiety   . AR (allergic rhinitis)   . Atrial fibrillation ([place])   . Basal cell carcinoma of ala nasi 2014  . Benign neoplasm stomach body   . Cataract    corrected with surgery  . Colitis   . CTS (carpal tunnel syndrome)   . Diverticulitis   . Diverticulosis   . Esophageal stricture   . Frequent headaches    likely migraine  . GERD (gastroesophageal reflux disease)   . Hepatic steatosis   . Hyperlipidemia   . Hypertension   . IBS (irritable bowel syndrome)   . Insomnia   . Internal hemorrhoids   . Osteopenia    Past Surgical History:  Procedure Laterality Date  . CATARACT EXTRACTION, BILATERAL     August 2018, October 2018  . CHOLECYSTECTOMY    . MOHS SURGERY  2014   nose     Current Meds  Medication Sig  . butalbital-acetaminophen-caffeine (FIORICET, ESGIC) 50-325-40 MG tablet Take 1 tablet by mouth 2 (two) times daily as needed.  . Cholecalciferol (VITAMIN D-3 PO) Take 10,000 Units by mouth See admin instructions. (Monday thru Friday)  . dicyclomine (BENTYL) 10 MG capsule TAKE 1 CAPSULE BY MOUTH UP TO FOUR TIMESA DAY AS NEEDED FOR ABDOMINALCRAMPS  . ELIQUIS 2.5 MG TABS tablet TAKE 1 TABLET BY MOUTH TWICE A DAY  . ezetimibe (ZETIA) 10 MG tablet Take 1 tablet 3 times a week.  . hydrOXYzine (ATARAX/VISTARIL) 10 MG  tablet Take 1 tablet by mouth daily.  . meclizine (ANTIVERT) 25 MG tablet Take 25 mg by mouth 3 (three) times daily as needed. For vertigo  . metoprolol tartrate (LOPRESSOR) 25 MG tablet TAKE 0.5 TABLETS (12.5 MG TOTAL) BY MOUTH 2 (TWO) TIMES DAILY AS NEEDED.  . Multiple Vitamin (MULTIVITAMIN) tablet Take 1 tablet by mouth daily.  . Probiotic Product (SOLUBLE FIBER/PROBIOTICS PO) Take by mouth.  . rosuvastatin (CRESTOR) 5 MG tablet Take 1 tablet (5 mg total) by mouth every other day.     Allergies:   Aspirin; Ciprofloxacin; Influenza vaccines; Nexium [esomeprazole magnesium]; Niacin and related; Propylene glycol;  Vagifem [estradiol]; Allegra [fexofenadine]; Bacitracin-polymyxin b; Ceclor [cefaclor]; Celebrex [celecoxib]; Corticosteroids; Doxepin hcl; Erythromycin; Flagyl [metronidazole]; Hydrocortisone; Levofloxacin; Levsin [hyoscyamine sulfate]; Lidocaine; Macrobid [nitrofurantoin macrocrystal]; Marcaine [bupivacaine hcl]; Nsaids; Penicillins; Prevacid [lansoprazole]; Pseudoephedrine; Sulfonamide derivatives; Tavist [clemastine]; and Cortisone   Social History   Tobacco Use  . Smoking status: Never Smoker  . Smokeless tobacco: Never Used  Substance Use Topics  . Alcohol use: No  . Drug use: No     Family Hx: The patient's family history includes Breast cancer in her sister; CAD in her brother and mother; Cancer in an other family member; Diabetes in an other family member; Leukemia in her father; Mesothelioma in her brother; Pancreatic cancer in her brother. There is no history of Colon cancer.  ROS:   Please see the history of present illness.    All other systems reviewed and are negative.   Prior CV studies:   The following studies were reviewed today:   Labs/Other Tests and Data Reviewed:    EKG:  No ECG reviewed.  Recent Labs: 08/28/2017: TSH 1.98 04/23/2018: ALT 11 05/01/2018: BUN 8; Creatinine, Ser 0.78; Hemoglobin 13.8; Platelets 260.0; Potassium 3.8; Sodium 139   Recent Lipid Panel Lab Results  Component Value Date/Time   CHOL 249 (H) 05/29/2017 08:28 AM   CHOL 165 10/14/2015   TRIG 176.0 (H) 05/29/2017 08:28 AM   TRIG 115 10/14/2015   HDL 49.50 05/29/2017 08:28 AM   HDL 51 10/14/2015   CHOLHDL 5 05/29/2017 08:28 AM   LDLCALC 164 (H) 05/29/2017 08:28 AM   LDLCALC 91 10/14/2015    Wt Readings from Last 3 Encounters:  05/24/18 133 lb (60.3 kg)  04/23/18 134 lb 1 oz (60.8 kg)  01/31/18 133 lb (60.3 kg)     Objective:    Vital Signs:  BP 128/71   Pulse 62   Ht 5\' 1"  (1.549 m)   Wt 133 lb (60.3 kg)   LMP 01/17/1988 (Approximate)   BMI 25.13 kg/m    VITAL SIGNS:   reviewed GEN:  no acute distress  ASSESSMENT & PLAN:    1. Atrial fibrillation, paroxysmal: She has had no recent palpitations. Continue Lopressor and Eliquis (dosing reviewed and appropriate). CHADS VASC score 3.   2. Hyperlipidemia: Lipids followed in primary care. Continue statin  3. Mitral regurgitation: Moderate by echo in Dr. Thurman Coyer office September 2019. Will repeat echo in September 2021.   COVID-19 Education: The signs and symptoms of COVID-19 were discussed with the patient and how to seek care for testing (follow up with PCP or arrange E-visit).  The importance of social distancing was discussed today.  Time:   Today, I have spent 16 minutes with the patient with telehealth technology discussing the above problems.     Medication Adjustments/Labs and Tests Ordered: Current medicines are reviewed at length with the patient today.  Concerns regarding medicines  are outlined above.   Tests Ordered: No orders of the defined types were placed in this encounter.   Medication Changes: No orders of the defined types were placed in this encounter.   Disposition:  Follow up in 6 month(s)  Signed, Lauree Chandler, MD  05/24/2018 11:48 AM    [facility]

## 2018-06-10 ENCOUNTER — Other Ambulatory Visit: Payer: Self-pay | Admitting: Family Medicine

## 2018-06-10 DIAGNOSIS — I4891 Unspecified atrial fibrillation: Secondary | ICD-10-CM

## 2018-06-10 DIAGNOSIS — M899 Disorder of bone, unspecified: Secondary | ICD-10-CM

## 2018-06-10 DIAGNOSIS — E785 Hyperlipidemia, unspecified: Secondary | ICD-10-CM

## 2018-06-13 ENCOUNTER — Other Ambulatory Visit (INDEPENDENT_AMBULATORY_CARE_PROVIDER_SITE_OTHER): Payer: Medicare Other

## 2018-06-13 ENCOUNTER — Other Ambulatory Visit: Payer: Self-pay

## 2018-06-13 ENCOUNTER — Ambulatory Visit (INDEPENDENT_AMBULATORY_CARE_PROVIDER_SITE_OTHER): Payer: Medicare Other

## 2018-06-13 DIAGNOSIS — Z0000 Encounter for general adult medical examination without abnormal findings: Secondary | ICD-10-CM

## 2018-06-13 DIAGNOSIS — I4891 Unspecified atrial fibrillation: Secondary | ICD-10-CM | POA: Diagnosis not present

## 2018-06-13 DIAGNOSIS — E785 Hyperlipidemia, unspecified: Secondary | ICD-10-CM | POA: Diagnosis not present

## 2018-06-13 DIAGNOSIS — M899 Disorder of bone, unspecified: Secondary | ICD-10-CM | POA: Diagnosis not present

## 2018-06-13 LAB — LIPID PANEL
Cholesterol: 157 mg/dL (ref 0–200)
HDL: 42.9 mg/dL (ref >39.00)
LDL Cholesterol: 75 mg/dL (ref 0–99)
NonHDL: 114.21
Total CHOL/HDL Ratio: 4
Triglycerides: 197 mg/dL — ABNORMAL HIGH (ref 0.0–149.0)
VLDL: 39.4 mg/dL (ref 0.0–40.0)

## 2018-06-13 LAB — VITAMIN D 25 HYDROXY (VIT D DEFICIENCY, FRACTURES): VITD: 90.48 ng/mL (ref 30.00–100.00)

## 2018-06-13 LAB — TSH: TSH: 3.38 u[IU]/mL (ref 0.35–4.50)

## 2018-06-13 NOTE — Progress Notes (Signed)
Subjective:   [name] is a 85 y.o. female who presents for Medicare Annual (Subsequent) preventive examination.  Review of Systems:  N/A Cardiac Risk Factors include: advanced age (>35men, >77 women);dyslipidemia     Objective:     Vitals: LMP 01/17/1988 (Approximate)   There is no height or weight on file to calculate BMI.  Advanced Directives 06/13/2018 09/09/2017 05/29/2017 01/06/2012  Does Patient Have a Medical Advance Directive? No No No Patient would like information;Patient does not have advance directive  Would patient like information on creating a medical advance directive? No - Patient declined No - Patient declined No - Patient declined Advance directive packet given  Pre-existing out of facility DNR order (yellow form or pink MOST form) - - - No    Tobacco Social History   Tobacco Use  Smoking Status Never Smoker  Smokeless Tobacco Never Used     Counseling given: No   Clinical Intake:  Pre-visit preparation completed: Yes  Pain : No/denies pain Pain Score: 0-No pain     Nutritional Status: BMI of 19-24  Normal Nutritional Risks: None Diabetes: No  How often do you need to have someone help you when you read instructions, pamphlets, or other written materials from your doctor or pharmacy?: 1 - Never What is the last grade level you completed in school?: 12th grade + commercial diploma  Interpreter Needed?: No  Comments: pt lives with spouse Information entered by :: LPinson, LPN  Past Medical History:  Diagnosis Date  . Anxiety   . AR (allergic rhinitis)   . Atrial fibrillation ([place])   . Basal cell carcinoma of ala nasi 2014  . Benign neoplasm stomach body   . Cataract    corrected with surgery  . Colitis   . CTS (carpal tunnel syndrome)   . Diverticulitis   . Diverticulosis   . Esophageal stricture   . Frequent headaches    likely migraine  . GERD (gastroesophageal reflux disease)   . Hepatic steatosis   . Hyperlipidemia    . Hypertension   . IBS (irritable bowel syndrome)   . Insomnia   . Internal hemorrhoids   . Osteopenia    Past Surgical History:  Procedure Laterality Date  . CATARACT EXTRACTION, BILATERAL     August 2018, October 2018  . CHOLECYSTECTOMY    . MOHS SURGERY  2014   nose   Family History  Problem Relation Age of Onset  . Cancer Other   . Diabetes Other   . CAD Brother   . Leukemia Father   . Pancreatic cancer Brother   . CAD Mother   . Breast cancer Sister   . Mesothelioma Brother   . Colon cancer Neg Hx    Social History   Socioeconomic History  . Marital status: Married    Spouse name: Not on file  . Number of children: Not on file  . Years of education: Not on file  . Highest education level: Not on file  Occupational History  . Not on file  Social Needs  . Financial resource strain: Not on file  . Food insecurity:    Worry: Not on file    Inability: Not on file  . Transportation needs:    Medical: Not on file    Non-medical: Not on file  Tobacco Use  . Smoking status: Never Smoker  . Smokeless tobacco: Never Used  Substance and Sexual Activity  . Alcohol use: No  . Drug use: No  .  Sexual activity: Not Currently    Partners: Male  Lifestyle  . Physical activity:    Days per week: Not on file    Minutes per session: Not on file  . Stress: Not on file  Relationships  . Social connections:    Talks on phone: Not on file    Gets together: Not on file    Attends religious service: Not on file    Active member of club or organization: Not on file    Attends meetings of clubs or organizations: Not on file    Relationship status: Not on file  Other Topics Concern  . 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 butalbital-acetaminophen-caffeine (FIORICET, ESGIC) 50-325-40 MG tablet Take 1  tablet by mouth 2 (two) times daily as needed.  . Cholecalciferol (VITAMIN D-3 PO) Take 10,000 Units by mouth See admin instructions. (Monday thru Friday)  . dicyclomine (BENTYL) 10 MG capsule TAKE 1 CAPSULE BY MOUTH UP TO FOUR TIMESA DAY AS NEEDED FOR ABDOMINALCRAMPS  . ELIQUIS 2.5 MG TABS tablet TAKE 1 TABLET BY MOUTH TWICE A DAY  . ezetimibe (ZETIA) 10 MG tablet Take 1 tablet 3 times a week.  . hydrOXYzine (ATARAX/VISTARIL) 10 MG tablet Take 1 tablet by mouth daily.  . meclizine (ANTIVERT) 25 MG tablet Take 25 mg by mouth 3 (three) times daily as needed. For vertigo  . metoprolol tartrate (LOPRESSOR) 25 MG tablet TAKE 0.5 TABLETS (12.5 MG TOTAL) BY MOUTH 2 (TWO) TIMES DAILY AS NEEDED.  . Multiple Vitamin (MULTIVITAMIN) tablet Take 1 tablet by mouth daily.  . rosuvastatin (CRESTOR) 5 MG tablet Take 1 tablet (5 mg total) by mouth every other day. (Patient taking differently: Take 5 mg by mouth 3 (three) times a week. )  . Probiotic Product (SOLUBLE FIBER/PROBIOTICS PO) Take by mouth.   No facility-administered encounter medications on file as of 06/13/2018.     Activities of Daily Living In your present state of health, do you have any difficulty performing the following activities: 06/13/2018  Hearing? N  Vision? N  Difficulty concentrating or making decisions? N  Walking or climbing stairs? N  Dressing or bathing? N  Doing errands, shopping? N  Preparing Food and eating ? N  Using the Toilet? N  In the past six months, have you accidently leaked urine? Y  Do you have problems with loss of bowel control? N  Managing your Medications? N  Managing your Finances? N  Housekeeping or managing your Housekeeping? N  Some recent data might be hidden    Patient Care Team: Tonia Ghent, MD as PCP - General (Family Medicine) Buford Dresser, MD as PCP - Cardiology (Cardiology) Jacolyn Reedy, MD as Consulting Physician (Cardiology)    Assessment:   This is a routine wellness  examination for [facility].  Vision Screening Comments: Vision exam in 2019  Exercise Activities and Dietary recommendations Current Exercise Habits: Home exercise routine, Type of exercise: Other - see comments(stationary bike), Time (Minutes): 20, Frequency (Times/Week): 7, Weekly Exercise (Minutes/Week): 140, Intensity: Mild, Exercise limited by: None identified  Goals    . DIET - INCREASE WATER INTAKE     Starting 06/13/18, I will attempt to drink 6-8 glasses of water daily.        Fall Risk Fall Risk  06/13/2018 05/29/2017  Falls in the past year? 0 No   Depression Screen PHQ 2/9 Scores 06/13/2018 05/29/2017  PHQ - 2 Score 0 0  PHQ- 9 Score 0 0     Cognitive Function MMSE - Mini Mental State Exam 06/13/2018 05/29/2017  Orientation to time 5 5  Orientation to Place 5 5  Registration 3 3  Attention/ Calculation 0 0  Recall 3 3  Language- name 2 objects 0 0  Language- repeat 1 1  Language- follow 3 step command 0 3  Language- read & follow direction 0 0  Write a sentence 0 0  Copy design 0 0  Total score 17 20        Immunization History  Administered Date(s) Administered  . Pneumococcal Polysaccharide-23 01/28/2010, 07/05/2012  . Td 01/29/2000, 01/28/2010  . Zoster 01/28/2010     Screening Tests Health Maintenance  Topic Date Due  . PNA vac Low Risk Adult (2 of 2 - PCV13) 05/29/2048 (Originally 07/05/2013)  . INFLUENZA VACCINE  08/17/2018  . TETANUS/TDAP  01/29/2020  . DEXA SCAN  Completed     Plan:     I have personally reviewed, addressed, and noted the following in the patient's chart:  A. Medical and social history B. Use of alcohol, tobacco or illicit drugs  C. Current medications and supplements D. Functional ability and status E.  Nutritional status F.  Physical activity G. Advance directives H. List of other physicians I.  Hospitalizations, surgeries, and ER visits in previous 12 months J.  Vitals (unless it is a telemedicine encounter) K.  Screenings to include cognitive, depression, hearing, vision (NOTE: hearing and vision screenings not completed in telemedicine encounter) L. Referrals and appointments   In addition, I have reviewed and discussed with patient certain preventive protocols, quality metrics, and best practice recommendations. A written personalized care plan for preventive services and recommendations were provided to patient.  With patient's permission, we connected on 06/13/18 at  8:30 AM EDT by a video enabled telemedicine application. Two patient identifiers were used to ensure the encounter occurred with the correct person.    Patient was in home and writer was in office.   Signed,   Lindell Noe, MHA, BS, LPN Health Coach

## 2018-06-13 NOTE — Progress Notes (Signed)
PCP notes:   Health maintenance:  No gaps identified  Abnormal screenings:   None  Patient concerns:   None  Nurse concerns:  None  Next PCP appt:   06/20/18 @ 0945  I reviewed health advisor's note, was available for consultation on the day of service listed in this note, and agree with documentation and plan. Elsie Stain, MD.

## 2018-06-13 NOTE — Patient Instructions (Signed)
[name] , Thank you for taking time to come for your Medicare Wellness Visit. I appreciate your ongoing commitment to your health goals. Please review the following plan we discussed and let me know if I can assist you in the future.   These are the goals we discussed: Goals    . DIET - INCREASE WATER INTAKE     Starting 06/13/18, I will attempt to drink 6-8 glasses of water daily.        This is a list of the screening recommended for you and due dates:  Health Maintenance  Topic Date Due  . Pneumonia vaccines (2 of 2 - PCV13) 05/29/2048*  . Flu Shot  08/17/2018  . Tetanus Vaccine  01/29/2020  . DEXA scan (bone density measurement)  Completed  *Topic was postponed. The date shown is not the original due date.   Preventive Care for Adults  A healthy lifestyle and preventive care can promote health and wellness. Preventive health guidelines for adults include the following key practices.  . A routine yearly physical is a good way to check with your health care provider about your health and preventive screening. It is a chance to share any concerns and updates on your health and to receive a thorough exam.  . Visit your dentist for a routine exam and preventive care every 6 months. Brush your teeth twice a day and floss once a day. Good oral hygiene prevents tooth decay and gum disease.  . The frequency of eye exams is based on your age, health, family medical history, use  of contact lenses, and other factors. Follow your health care provider's recommendations for frequency of eye exams.  . Eat a healthy diet. Foods like vegetables, fruits, whole grains, low-fat dairy products, and lean protein foods contain the nutrients you need without too many calories. Decrease your intake of foods high in solid fats, added sugars, and salt. Eat the right amount of calories for you. Get information about a proper diet from your health care provider, if necessary.  . Regular physical  exercise is one of the most important things you can do for your health. Most adults should get at least 150 minutes of moderate-intensity exercise (any activity that increases your heart rate and causes you to sweat) each week. In addition, most adults need muscle-strengthening exercises on 2 or more days a week.  Silver Sneakers may be a benefit available to you. To determine eligibility, you may visit the website: www.silversneakers.com or contact program at 434-371-0464 Mon-Fri between 8AM-8PM.   . Maintain a healthy weight. The body mass index (BMI) is a screening tool to identify possible weight problems. It provides an estimate of body fat based on height and weight. Your health care provider can find your BMI and can help you achieve or maintain a healthy weight.   For adults 20 years and older: ? A BMI below 18.5 is considered underweight. ? A BMI of 18.5 to 24.9 is normal. ? A BMI of 25 to 29.9 is considered overweight. ? A BMI of 30 and above is considered obese.   . Maintain normal blood lipids and cholesterol levels by exercising and minimizing your intake of saturated fat. Eat a balanced diet with plenty of fruit and vegetables. Blood tests for lipids and cholesterol should begin at age 32 and be repeated every 5 years. If your lipid or cholesterol levels are high, you are over 50, or you are at high risk for heart disease, you may  need your cholesterol levels checked more frequently. Ongoing high lipid and cholesterol levels should be treated with medicines if diet and exercise are not working.  . If you smoke, find out from your health care provider how to quit. If you do not use tobacco, please do not start.  . If you choose to drink alcohol, please do not consume more than 2 drinks per day. One drink is considered to be 12 ounces (355 mL) of beer, 5 ounces (148 mL) of wine, or 1.5 ounces (44 mL) of liquor.  . If you are 23-4 years old, ask your health care provider if you  should take aspirin to prevent strokes.  . Use sunscreen. Apply sunscreen liberally and repeatedly throughout the day. You should seek shade when your shadow is shorter than you. Protect yourself by wearing long sleeves, pants, a wide-brimmed hat, and sunglasses year round, whenever you are outdoors.  . Once a month, do a whole body skin exam, using a mirror to look at the skin on your back. Tell your health care provider of new moles, moles that have irregular borders, moles that are larger than a pencil eraser, or moles that have changed in shape or color.

## 2018-06-20 ENCOUNTER — Ambulatory Visit (INDEPENDENT_AMBULATORY_CARE_PROVIDER_SITE_OTHER): Payer: Medicare Other | Admitting: Family Medicine

## 2018-06-20 ENCOUNTER — Encounter: Payer: Self-pay | Admitting: Family Medicine

## 2018-06-20 DIAGNOSIS — R195 Other fecal abnormalities: Secondary | ICD-10-CM | POA: Diagnosis not present

## 2018-06-20 DIAGNOSIS — R51 Headache: Secondary | ICD-10-CM | POA: Diagnosis not present

## 2018-06-20 DIAGNOSIS — Z7189 Other specified counseling: Secondary | ICD-10-CM | POA: Diagnosis not present

## 2018-06-20 DIAGNOSIS — Z0000 Encounter for general adult medical examination without abnormal findings: Secondary | ICD-10-CM

## 2018-06-20 DIAGNOSIS — E785 Hyperlipidemia, unspecified: Secondary | ICD-10-CM | POA: Diagnosis not present

## 2018-06-20 DIAGNOSIS — I480 Paroxysmal atrial fibrillation: Secondary | ICD-10-CM

## 2018-06-20 DIAGNOSIS — K589 Irritable bowel syndrome without diarrhea: Secondary | ICD-10-CM | POA: Diagnosis not present

## 2018-06-20 DIAGNOSIS — R519 Headache, unspecified: Secondary | ICD-10-CM

## 2018-06-20 MED ORDER — BUTALBITAL-APAP-CAFFEINE 50-325-40 MG PO TABS: 1.0000 | ORAL_TABLET | Freq: Two times a day (BID) | ORAL | 1 refills | Status: DC | PRN

## 2018-06-20 MED ORDER — ROSUVASTATIN CALCIUM 5 MG PO TABS: 5.0000 mg | ORAL_TABLET | ORAL | Status: DC

## 2018-06-20 NOTE — Progress Notes (Signed)
Virtual visit completed through WebEx or similar program Patient location: home  Provider location: Financial controller at [facility], office   Pandemic considerations d/w pt.   Limitations and rationale for visit method d/w patient.  Patient agreed to proceed.   CC: follow up.   HPI:  Husband and both kids equally designated if patient were incapacitated. Mammogram 2019 DXA deferred for now given pandemic.   Vaccination discussed with patient, encouraged.  Prev vaccines (zostavax, PNA) not done per patient report.    She needed refill on fioricet.  No ADE on med.  Some relief from med. [facility].    IBS.  Still with some cramping. Bentyl helps. No ADE on med.  No blood in stool. She has had some occ dark stools.  Not consistent.  Will will send IFOB to patient.   Elevated Cholesterol: Using medications without problems: yes Muscle aches: no Diet compliance: encouraged.   Exercise: encouraged.    AF.  On anticoagulation.  Not on BB, as this is PRN and not needed.  Not lightheaded.  Not CP, SOB.  No BLE edema.  BP this AM 130/79.  Pulse 72.  132 lbs today.    PMH SH FH reviewed.   Meds and allergies reviewed.   ROS: Per HPI unless specifically indicated in ROS section   NAD Speech wnl  A/P:  Husband and both kids equally designated if patient were incapacitated. Mammogram 2019 DXA deferred for now given pandemic.   Vaccination discussed with patient, encouraged.  Prev vaccines (zostavax, PNA) not done per patient report.    She needed refill on fioricet.  No ADE on med.  Some relief from med. [facility].  rx sent.   IBS.  Still with some cramping. Bentyl helps. No ADE on med.  No blood in stool. She has had some occ dark stools.  Not consistent.  Will will send IFOB to patient.  No change in meds for now.   Elevated Cholesterol: No change in meds.   AF.  On anticoagulation.  Not using BB, as this is PRN and not needed.  Not lightheaded.  Not CP, SOB.  No BLE edema.    BP this AM 130/79.  Pulse 72.  132 lbs today.   Continue as is with current meds.

## 2018-06-23 NOTE — Assessment & Plan Note (Signed)
On anticoagulation.  Not using BB, as this is PRN and not needed.  Not lightheaded.  Not CP, SOB.  No BLE edema.   BP this AM 130/79.  Pulse 72.  132 lbs today.   Continue as is with current meds.

## 2018-06-23 NOTE — Assessment & Plan Note (Signed)
No change in meds.

## 2018-06-23 NOTE — Assessment & Plan Note (Signed)
Husband and both kids equally designated if patient were incapacitated.

## 2018-06-23 NOTE — Assessment & Plan Note (Signed)
She needed refill on fioricet.  No ADE on med.  Some relief from med. [facility].  rx sent.

## 2018-06-23 NOTE — Assessment & Plan Note (Signed)
Husband and both kids equally designated if patient were incapacitated. Mammogram 2019 DXA deferred for now given pandemic.   Vaccination discussed with patient, encouraged.  Prev vaccines (zostavax, PNA) not done per patient report.

## 2018-06-23 NOTE — Assessment & Plan Note (Signed)
Still with some cramping. Bentyl helps. No ADE on med.  No blood in stool. She has had some occ dark stools.  Not consistent.  Will will send IFOB to patient.  No change in meds for now.

## 2018-07-15 ENCOUNTER — Other Ambulatory Visit (INDEPENDENT_AMBULATORY_CARE_PROVIDER_SITE_OTHER): Payer: Medicare Other

## 2018-07-15 ENCOUNTER — Other Ambulatory Visit: Payer: Self-pay | Admitting: Family Medicine

## 2018-07-15 ENCOUNTER — Telehealth: Payer: Self-pay

## 2018-07-15 DIAGNOSIS — R195 Other fecal abnormalities: Secondary | ICD-10-CM

## 2018-07-15 LAB — FECAL OCCULT BLOOD, IMMUNOCHEMICAL: Fecal Occult Bld: POSITIVE — AB

## 2018-07-15 NOTE — Telephone Encounter (Signed)
[facility] called to report a positive IFOB

## 2018-07-15 NOTE — Telephone Encounter (Signed)
See notes on labs. 

## 2018-07-17 ENCOUNTER — Other Ambulatory Visit (INDEPENDENT_AMBULATORY_CARE_PROVIDER_SITE_OTHER): Payer: Medicare Other

## 2018-07-17 DIAGNOSIS — R195 Other fecal abnormalities: Secondary | ICD-10-CM

## 2018-07-17 LAB — CBC WITH DIFFERENTIAL/PLATELET
Basophils Absolute: 0 10*3/uL (ref 0.0–0.1)
Basophils Relative: 0.4 % (ref 0.0–3.0)
Eosinophils Absolute: 0.1 10*3/uL (ref 0.0–0.7)
Eosinophils Relative: 2.2 % (ref 0.0–5.0)
HCT: 39.4 % (ref 36.0–46.0)
Hemoglobin: 13.1 g/dL (ref 12.0–15.0)
Lymphocytes Relative: 30.8 % (ref 12.0–46.0)
Lymphs Abs: 1.6 10*3/uL (ref 0.7–4.0)
MCHC: 33.4 g/dL (ref 30.0–36.0)
MCV: 98.3 fl (ref 78.0–100.0)
Monocytes Absolute: 0.5 10*3/uL (ref 0.1–1.0)
Monocytes Relative: 8.8 % (ref 3.0–12.0)
Neutro Abs: 3.1 10*3/uL (ref 1.4–7.7)
Neutrophils Relative %: 57.8 % (ref 43.0–77.0)
Platelets: 203 10*3/uL (ref 150.0–400.0)
RBC: 4.01 Mil/uL (ref 3.87–5.11)
RDW: 13.6 % (ref 11.5–15.5)
WBC: 5.3 10*3/uL (ref 4.0–10.5)

## 2018-07-31 ENCOUNTER — Telehealth: Payer: Self-pay

## 2018-07-31 NOTE — Telephone Encounter (Signed)
Covid-19 screening questions   Do you now or have you had a fever in the last 14 days? No  Do you have any respiratory symptoms of shortness of breath or cough now or in the last 14 days? No  Do you have any family members or close contacts with diagnosed or suspected Covid-19 in the past 14 days? No  Have you been tested for Covid-19 and found to be positive? No  Referred by primary care doctor for positive heme card test.

## 2018-08-02 ENCOUNTER — Other Ambulatory Visit: Payer: Self-pay

## 2018-08-02 ENCOUNTER — Encounter: Payer: Self-pay | Admitting: Gastroenterology

## 2018-08-02 ENCOUNTER — Telehealth: Payer: Self-pay

## 2018-08-02 ENCOUNTER — Ambulatory Visit (INDEPENDENT_AMBULATORY_CARE_PROVIDER_SITE_OTHER): Payer: Medicare Other | Admitting: Gastroenterology

## 2018-08-02 VITALS — BP 130/72 | HR 72 | Temp 97.8°F | Ht 59.5 in | Wt 133.5 lb

## 2018-08-02 DIAGNOSIS — R195 Other fecal abnormalities: Secondary | ICD-10-CM | POA: Diagnosis not present

## 2018-08-02 DIAGNOSIS — Z7901 Long term (current) use of anticoagulants: Secondary | ICD-10-CM

## 2018-08-02 MED ORDER — SUPREP BOWEL PREP KIT 17.5-3.13-1.6 GM/177ML PO SOLN: 1.0000 | ORAL | 0 refills | Status: DC

## 2018-08-02 NOTE — Telephone Encounter (Signed)
[facility] Pre-operative Risk Assessment     Request for surgical clearance:     Endoscopy Procedure  What type of surgery is being performed?     colonoscopy  When is this surgery scheduled?     08/27/18  What type of clearance is required ?   Pharmacy  Are there any medications that need to be held prior to surgery and how long? Eliquis 2 day hold  Practice name and name of physician performing surgery?      [facility]  What is your office phone and fax number?      Phone- [phone]  [phone]  Anesthesia type (None, local, MAC, general) ?       MAC

## 2018-08-02 NOTE — Progress Notes (Signed)
Reviewed and agree with management plan.  Kaili Castille T. Crestina Strike, MD FACG 

## 2018-08-02 NOTE — Telephone Encounter (Signed)
Patient with diagnosis of Afib on Eliquis for anticoagulation.    Procedure: colonoscopy Date of procedure: 08/27/2018  CHADS2-VASc score of  4 (HTN, AGE, AGE, female)  CrCl 96ml/min  Per office protocol, patient can hold Eliquis for 2 days prior to procedure.

## 2018-08-02 NOTE — Patient Instructions (Signed)

## 2018-08-02 NOTE — Progress Notes (Signed)
08/02/2018 [name] [id number] [birth year]   HISTORY OF PRESENT ILLNESS: This is a pleasant 85 year old female is a patient of Dr. Lynne Leader.  Her last colonoscopy was in January 2014 at which time she was found to have mild diverticulosis of the sigmoid colon.  No further screening procedures were recommended due to age.  She is here today at the request of her PCP, Dr. Damita Dunnings, for further evaluation in regards to positive IFOB study.  This was listed as a positive Cologuard, but it was in fact only a fecal occult blood test.  This was 2 weeks ago.  She denies seeing any blood in her stools.  She says that on a rare occasion she will have a dark-colored stool, but then goes back to normal at the next bowel movement.  She knows that she has hemorrhoids that get irritated on occasion so wonders if that could be the source even though she has not physically seen blood.  Hemoglobin was completely normal on July 1 at 13.1 g.  She has longstanding history of issues with diverticular disease as well as IBS and takes dicyclomine regularly for that.  The medication does help her symptoms.  She does have paroxysmal atrial fibrillation that has been stable.  She takes Eliquis twice daily for that under the care of Dr. Angelena Form.   Past Medical History:  Diagnosis Date  . Anxiety   . AR (allergic rhinitis)   . Atrial fibrillation ([place])   . Basal cell carcinoma of ala nasi 2014  . Benign neoplasm stomach body   . Cataract    corrected with surgery  . Colitis   . CTS (carpal tunnel syndrome)   . Diverticulitis   . Diverticulosis   . Esophageal stricture   . Frequent headaches    likely migraine  . GERD (gastroesophageal reflux disease)   . Hepatic steatosis   . Hyperlipidemia   . Hypertension   . IBS (irritable bowel syndrome)   . Insomnia   . Internal hemorrhoids   . Osteopenia    Past Surgical History:  Procedure Laterality Date  . CATARACT EXTRACTION, BILATERAL     August 2018,  October 2018  . CHOLECYSTECTOMY    . MOHS SURGERY  2014   nose    reports that she has never smoked. She has never used smokeless tobacco. She reports that she does not drink alcohol or use drugs. family history includes Breast cancer in her sister; CAD in her brother and mother; Cancer in an other family member; Diabetes in an other family member; Leukemia in her father; Mesothelioma in her brother; Pancreatic cancer in her brother. Allergies  Allergen Reactions  . Aspirin   . Ciprofloxacin   . Influenza Vaccines     Local reaction  . Nexium [Esomeprazole Magnesium]   . Niacin And Related   . Propylene Glycol   . Vagifem [Estradiol]   . Allegra [Fexofenadine]   . Bacitracin-Polymyxin B   . Ceclor [Cefaclor]   . Celebrex [Celecoxib]   . Corticosteroids   . Doxepin Hcl   . Erythromycin   . Flagyl [Metronidazole]   . Hydrocortisone Other (See Comments)    After use of 1% HC cream, pt reported burning and feeling "scalded" in vaginal area.  . Levofloxacin   . Levsin [Hyoscyamine Sulfate]   . Lidocaine Other (See Comments)    Unknown reaction  . Macrobid [Nitrofurantoin Macrocrystal]   . Marcaine [Bupivacaine Hcl]   . Nsaids   .  Penicillins   . Prevacid [Lansoprazole]     Able to tolerate omeprazole.   . Pseudoephedrine   . Sulfonamide Derivatives   . Tavist [Clemastine]   . Cortisone Rash    Any hydrocortisone cream component with severe vaginal burning      Outpatient Encounter Medications as of 08/02/2018  Medication Sig  . butalbital-acetaminophen-caffeine (FIORICET) 50-325-40 MG tablet Take 1 tablet by mouth 2 (two) times daily as needed.  . Cholecalciferol (VITAMIN D-3 PO) Take 10,000 Units by mouth See admin instructions. (Monday thru Friday)  . dicyclomine (BENTYL) 10 MG capsule TAKE 1 CAPSULE BY MOUTH UP TO FOUR TIMESA DAY AS NEEDED FOR ABDOMINALCRAMPS  . ELIQUIS 2.5 MG TABS tablet TAKE 1 TABLET BY MOUTH TWICE A DAY  . ezetimibe (ZETIA) 10 MG tablet Take 1  tablet 3 times a week.  . hydrOXYzine (ATARAX/VISTARIL) 10 MG tablet Take 1 tablet by mouth daily.  . meclizine (ANTIVERT) 25 MG tablet Take 25 mg by mouth 3 (three) times daily as needed. For vertigo  . metoprolol tartrate (LOPRESSOR) 25 MG tablet TAKE 0.5 TABLETS (12.5 MG TOTAL) BY MOUTH 2 (TWO) TIMES DAILY AS NEEDED.  . Multiple Vitamin (MULTIVITAMIN) tablet Take 1 tablet by mouth daily.  . Probiotic Product (SOLUBLE FIBER/PROBIOTICS PO) Take by mouth.  . rosuvastatin (CRESTOR) 5 MG tablet Take 1 tablet (5 mg total) by mouth 3 (three) times a week.   No facility-administered encounter medications on file as of 08/02/2018.      REVIEW OF SYSTEMS  : All other systems reviewed and negative except where noted in the History of Present Illness.   PHYSICAL EXAM: Temp 97.8 F (36.6 C)   Ht 4' 11.5" (1.511 m) Comment: height measured without shoes  Wt 133 lb 8 oz (60.6 kg)   LMP 01/17/1988 (Approximate)   BMI 26.51 kg/m  General: Well developed white female in no acute distress Head: Normocephalic and atraumatic Eyes:  Sclerae anicteric, conjunctiva pink. Ears: Normal auditory acuity Lungs: Clear throughout to auscultation; no increased WOB. Heart: Regular rate and rhythm; no M/R/G. Abdomen: Soft, non-distended.  BS present.  Mild right sided TTP. Musculoskeletal: Symmetrical with no gross deformities  Skin: No lesions on visible extremities Extremities: No edema  Neurological: Alert oriented x 4, grossly non-focal Psychological:  Alert and cooperative. Normal mood and affect  ASSESSMENT AND PLAN: *Positive fecal occult blood test, IFOB:  Performed by PCP.  I had a long discussion with the patient.  Options are to proceed with colonoscopy or observation for now.  She denies seeing any blood in her stools and hemoglobin is normal.  Overall for 85 years old she is in rather good health I think she could withstand colonoscopy.  That being said if this issue is going to continue to bother  her then she could just proceed with the procedure.  If she chooses not to proceed and chooses observation then I would be comfortable with that decision for now as well.  We are going to schedule procedure currently and she will discuss with her husband and children as well as her PCP.  If she changes her mind then she will call and cancel and procedure. *Chronic anticoagulation with Eliquis for PAF:  Will hold Eliquis for 2 days prior to endoscopic procedures - will instruct when and how to resume after procedure. Benefits and risks of procedure explained including risks of bleeding, perforation, infection, missed lesions, reactions to medications and possible need for hospitalization and surgery for complications. Additional rare  but real risk of stroke or other vascular clotting events off of Eliqusi also explained and need to seek urgent help if any signs of these problems occur. Will communicate by phone or EMR with patient's prescribing provider, Dr. Julianne Handler to confirm that holding Eliquis is reasonable in this case.    CC:  Tonia Ghent, MD

## 2018-08-02 NOTE — Telephone Encounter (Signed)
Spoke to patient. Informed her that she has been cleared to hold Eliquis for 2 days prior to her procedure. She voiced understanding

## 2018-08-26 ENCOUNTER — Telehealth: Payer: Self-pay | Admitting: Gastroenterology

## 2018-08-26 NOTE — Telephone Encounter (Signed)
Since she took Eliquis yesterday morning and her procedure is at 1300 tomorrow she can proceed with colonoscopy as it will be on hold for over 48 hours. Continue to hold Eliquis until procedure is completed.

## 2018-08-26 NOTE — Telephone Encounter (Signed)
Dr. Fuller Plan,  Coralyn Mark called and said that she forgot and took Eliquis yesterday morning. She was instructed to hold Eliquis 2 days prior to her procedure. Is it ok if she keeps her scheduled procedure on the 11th? Please advise and I will call the patient. Thanks!   Riki Sheer, LPN ( Admitting )

## 2018-08-26 NOTE — Telephone Encounter (Signed)
Patient was called and told that it is ok to proceed with her colonoscopy as scheduled per Dr. Fuller Plan.  Patient verbalizes understanding to hold Eliquis until after her procedure.   Riki Sheer, LPN ( Admitting )

## 2018-08-26 NOTE — Telephone Encounter (Signed)
Left message to call back to ask Covid-19 screening questions. Covid-19 Screening Questions:  Do you now or have you had a fever in the last 14 days? no  Do you have any respiratory symptoms of shortness of breath or cough now or in the last 14 days? no  Do you have any family members or close contacts with diagnosed or suspected Covid-19 in the past 14 days? no  Have you been tested for Covid-19 and found to be positive? no

## 2018-08-27 ENCOUNTER — Encounter: Payer: Self-pay | Admitting: Gastroenterology

## 2018-08-27 ENCOUNTER — Ambulatory Visit (AMBULATORY_SURGERY_CENTER): Payer: Medicare Other | Admitting: Gastroenterology

## 2018-08-27 ENCOUNTER — Other Ambulatory Visit: Payer: Self-pay

## 2018-08-27 VITALS — BP 129/70 | HR 80 | Temp 98.2°F | Resp 15 | Ht 59.0 in | Wt 133.0 lb

## 2018-08-27 DIAGNOSIS — K5730 Diverticulosis of large intestine without perforation or abscess without bleeding: Secondary | ICD-10-CM

## 2018-08-27 DIAGNOSIS — E785 Hyperlipidemia, unspecified: Secondary | ICD-10-CM | POA: Diagnosis not present

## 2018-08-27 DIAGNOSIS — R195 Other fecal abnormalities: Secondary | ICD-10-CM

## 2018-08-27 DIAGNOSIS — K648 Other hemorrhoids: Secondary | ICD-10-CM | POA: Diagnosis not present

## 2018-08-27 DIAGNOSIS — K5520 Angiodysplasia of colon without hemorrhage: Secondary | ICD-10-CM

## 2018-08-27 DIAGNOSIS — I10 Essential (primary) hypertension: Secondary | ICD-10-CM | POA: Diagnosis not present

## 2018-08-27 MED ORDER — SODIUM CHLORIDE 0.9 % IV SOLN: 500.0000 mL | Freq: Once | INTRAVENOUS | Status: DC

## 2018-08-27 NOTE — Progress Notes (Signed)
Report to PACU, RN, vss, BBS= Clear.  

## 2018-08-27 NOTE — Op Note (Signed)
[facility] Patient Name: [name] Procedure Date: 08/27/2018 1:03 PM MRN: [id number] Endoscopist: Ladene Artist , MD Age: 85 Referring MD:  Date of Birth: November 03, 1932 Gender: Female Account #: [id number] Procedure:                Colonoscopy Indications:              Positive fecal immunochemical test Medicines:                Monitored Anesthesia Care Procedure:                Pre-Anesthesia Assessment:                           - Prior to the procedure, a History and Physical                            was performed, and patient medications and                            allergies were reviewed. The patient's tolerance of                            previous anesthesia was also reviewed. The risks                            and benefits of the procedure and the sedation                            options and risks were discussed with the patient.                            All questions were answered, and informed consent                            was obtained. Prior Anticoagulants: The patient has                            taken Eliquis (apixaban), last dose was 2 days                            prior to procedure. ASA Grade Assessment: III - A                            patient with severe systemic disease. After                            reviewing the risks and benefits, the patient was                            deemed in satisfactory condition to undergo the                            procedure.  After obtaining informed consent, the colonoscope                            was passed under direct vision. Throughout the                            procedure, the patient's blood pressure, pulse, and                            oxygen saturations were monitored continuously. The                            Colonoscope was introduced through the anus and                            advanced to the the cecum, identified by           appendiceal orifice and ileocecal valve. The                            ileocecal valve, appendiceal orifice, and rectum                            were photographed. The quality of the bowel                            preparation was excellent. The colonoscopy was                            performed without difficulty. The patient tolerated                            the procedure well. Scope In: 1:08:40 PM Scope Out: 1:24:01 PM Scope Withdrawal Time: 0 hours 9 minutes 41 seconds  Total Procedure Duration: 0 hours 15 minutes 21 seconds  Findings:                 The perianal and digital rectal examinations were                            normal.                           A single medium-sized localized angiodysplastic                            lesion without bleeding was found in the cecum.                           Multiple medium-mouthed diverticula were found in                            the entire colon, more concentrated in the left                            colon. There was no evidence of  diverticular                            bleeding.                           Internal hemorrhoids were found during                            retroflexion. The hemorrhoids were small and Grade                            I (internal hemorrhoids that do not prolapse).                           The exam was otherwise without abnormality on                            direct and retroflexion views. Complications:            No immediate complications. Estimated blood loss:                            None. Estimated Blood Loss:     Estimated blood loss: none. Impression:               - A single non-bleeding colonic angiodysplastic                            lesion.                           - Moderate diverticulosis in the entire examined                            colon.                           - Internal hemorrhoids.                           - The examination was otherwise normal on  direct                            and retroflexion views.                           - No specimens collected. Recommendation:           - Resume Eliquis (apixaban) tomorrow at prior dose.                            Refer to managing physician for further adjustment                            of therapy.                           - Patient has a contact number available for  emergencies. The signs and symptoms of potential                            delayed complications were discussed with the                            patient. Return to normal activities tomorrow.                            Written discharge instructions were provided to the                            patient.                           - Resume previous diet.                           - Continue present medications.                           - No repeat screening colonoscopy, screening iFOB,                            Cologuard due to age and the absence of colonic                            polyps. Ladene Artist, MD 08/27/2018 1:29:02 PM This report has been signed electronically.

## 2018-08-27 NOTE — Progress Notes (Signed)
Pt's states no medical or surgical changes since previsit or office visit. [place] temps and CW vitals

## 2018-08-27 NOTE — Patient Instructions (Signed)
Please read handouts provided. Resume Eliquis tomorrow at prior dose. Continue present medications.         YOU HAD AN ENDOSCOPIC PROCEDURE TODAY AT [facility]:   Refer to the procedure report that was given to you for any specific questions about what was found during the examination.  If the procedure report does not answer your questions, please call your gastroenterologist to clarify.  If you requested that your care partner not be given the details of your procedure findings, then the procedure report has been included in a sealed envelope for you to review at your convenience later.  YOU SHOULD EXPECT: Some feelings of bloating in the abdomen. Passage of more gas than usual.  Walking can help get rid of the air that was put into your GI tract during the procedure and reduce the bloating. If you had a lower endoscopy (such as a colonoscopy or flexible sigmoidoscopy) you may notice spotting of blood in your stool or on the toilet paper. If you underwent a bowel prep for your procedure, you may not have a normal bowel movement for a few days.  Please Note:  You might notice some irritation and congestion in your nose or some drainage.  This is from the oxygen used during your procedure.  There is no need for concern and it should clear up in a day or so.  SYMPTOMS TO REPORT IMMEDIATELY:   Following lower endoscopy (colonoscopy or flexible sigmoidoscopy):  Excessive amounts of blood in the stool  Significant tenderness or worsening of abdominal pains  Swelling of the abdomen that is new, acute  Fever of 100F or higher   For urgent or emergent issues, a gastroenterologist can be reached at any hour by calling [phone].   DIET:  We do recommend a small meal at first, but then you may proceed to your regular diet.  Drink plenty of fluids but you should avoid alcoholic beverages for 24 hours.  ACTIVITY:  You should plan to take it easy for the rest of today and  you should NOT DRIVE or use heavy machinery until tomorrow (because of the sedation medicines used during the test).    FOLLOW UP: Our staff will call the number listed on your records 48-72 hours following your procedure to check on you and address any questions or concerns that you may have regarding the information given to you following your procedure. If we do not reach you, we will leave a message.  We will attempt to reach you two times.  During this call, we will ask if you have developed any symptoms of COVID 19. If you develop any symptoms (ie: fever, flu-like symptoms, shortness of breath, cough etc.) before then, please call [phone].  If you test positive for Covid 19 in the 2 weeks post procedure, please call and report this information to [place].    If any biopsies were taken you will be contacted by phone or by letter within the next 1-3 weeks.  Please call us at [phone] if you have not heard about the biopsies in 3 weeks.    SIGNATURES/CONFIDENTIALITY: You and/or your care partner have signed paperwork which will be entered into your electronic medical record.  These signatures attest to the fact that that the information above on your After Visit Summary has been reviewed and is understood.  Full responsibility of the confidentiality of this discharge information lies with you and/or your care-partner.

## 2018-08-28 ENCOUNTER — Telehealth: Payer: Self-pay | Admitting: Family Medicine

## 2018-08-28 NOTE — Telephone Encounter (Signed)
Per GI, she was to resume Eliquis (apixaban) tomorrow at prior dose. If she has more bleeding or black stools, then let me know.  Thanks.

## 2018-08-29 ENCOUNTER — Telehealth: Payer: Self-pay

## 2018-08-29 NOTE — Telephone Encounter (Signed)
Patient advised. Patient states she resumed Eliquis last night.

## 2018-08-29 NOTE — Telephone Encounter (Signed)
  Follow up Call-  Call back number 08/27/2018  Post procedure Call Back phone  # [phone]  Permission to leave phone message Yes  Some recent data might be hidden     Patient questions:  Do you have a fever, pain , or abdominal swelling? No. Pain Score  0 *  Have you tolerated food without any problems? Yes.    Have you been able to return to your normal activities? Yes.    Do you have any questions about your discharge instructions: Diet   No. Medications  No. Follow up visit  No.  Do you have questions or concerns about your Care? No.  Actions: * If pain score is 4 or above: No action needed, pain <4.  1. Have you developed a fever since your procedure? no  2.   Have you had an respiratory symptoms (SOB or cough) since your procedure? no  3.   Have you tested positive for COVID 19 since your procedure no  4.   Have you had any family members/close contacts diagnosed with the COVID 19 since your procedure?  no   If yes to any of these questions please route to Joylene John, RN and Alphonsa Gin, Therapist, sports.

## 2018-08-30 DIAGNOSIS — H52203 Unspecified astigmatism, bilateral: Secondary | ICD-10-CM | POA: Diagnosis not present

## 2018-08-30 DIAGNOSIS — Z961 Presence of intraocular lens: Secondary | ICD-10-CM | POA: Diagnosis not present

## 2018-08-30 NOTE — Telephone Encounter (Signed)
Noted. Thanks.

## 2018-09-02 ENCOUNTER — Other Ambulatory Visit: Payer: Self-pay | Admitting: Family Medicine

## 2018-09-08 ENCOUNTER — Telehealth: Payer: Self-pay | Admitting: Family Medicine

## 2018-09-08 NOTE — Telephone Encounter (Signed)
Notify pt.   Colonoscopy with  - A single non-bleeding colonic angiodysplastic lesion. - Moderate diverticulosis in the entire examined colon. - Internal hemorrhoids.  Those could cause bleeding/dark stools with or without eliquis. Given her h/o AF and normal HGB, and assuming she doesn't have frequent black stools or gross blood in stool, then likely reasonable to continue with eliquis,  If more black stools/blood in stool, then she may need to hold medicine.    Routed to cardiology as FYI and for input.  Thanks.

## 2018-09-09 NOTE — Telephone Encounter (Signed)
Patient advised.

## 2018-09-09 NOTE — Telephone Encounter (Signed)
Agree. Thanks. Chris 

## 2018-10-07 ENCOUNTER — Other Ambulatory Visit: Payer: Self-pay | Admitting: Family Medicine

## 2018-10-07 NOTE — Telephone Encounter (Signed)
Electronic refill request. Eliquis Last office visit:   06/20/2018 Last Filled:    60 tablet 4 05/06/2018  Please advise.

## 2018-10-08 NOTE — Telephone Encounter (Signed)
Sent. Thanks.   

## 2018-11-05 ENCOUNTER — Ambulatory Visit (INDEPENDENT_AMBULATORY_CARE_PROVIDER_SITE_OTHER): Payer: Medicare Other

## 2018-11-05 DIAGNOSIS — Z23 Encounter for immunization: Secondary | ICD-10-CM

## 2018-11-25 DIAGNOSIS — Z803 Family history of malignant neoplasm of breast: Secondary | ICD-10-CM | POA: Diagnosis not present

## 2018-11-25 DIAGNOSIS — Z1231 Encounter for screening mammogram for malignant neoplasm of breast: Secondary | ICD-10-CM | POA: Diagnosis not present

## 2018-11-25 LAB — HM MAMMOGRAPHY

## 2018-12-19 ENCOUNTER — Other Ambulatory Visit: Payer: Self-pay | Admitting: Family Medicine

## 2019-01-02 ENCOUNTER — Encounter: Payer: Self-pay | Admitting: Family Medicine

## 2019-01-21 ENCOUNTER — Telehealth: Payer: Self-pay

## 2019-01-21 NOTE — Telephone Encounter (Signed)
Mrs Nydegger left v/m wanting to know if pt should  get covid vaccine and if so request assistance getting scheduled to get covid vaccine. Mrs Fontana request cb.

## 2019-01-21 NOTE — Telephone Encounter (Signed)
Recommended to get the vaccine.  She can check the [facility] web site for information about availability.  They will be updating that when vaccine is available.     ShippingScam.co.uk  She has mult med intolerances.  We have talked about this in the past and she was unable to clarify all of the intolerances.  She did reportedly have local reaction to flu shot but that would not be a contraindication to the covid vaccine.  Thanks.

## 2019-01-21 NOTE — Telephone Encounter (Signed)
Patient advised to check [facility] web site for information about availability.

## 2019-01-23 ENCOUNTER — Ambulatory Visit: Payer: Medicare Other | Admitting: Obstetrics and Gynecology

## 2019-01-23 NOTE — Telephone Encounter (Signed)
Patient called back. Patient states she is worried about getting COVID vaccine and does not think she should take it especially with all the reactions/intolerance she has (listed in allergy section). States every time she gets a cortisone injection she has a reaction. She did tolerate flu shot ok this year. She wanted to let Dr Damita Dunnings know and get your opinion.

## 2019-01-23 NOTE — Telephone Encounter (Signed)
Patient advised.

## 2019-01-23 NOTE — Telephone Encounter (Addendum)
The issues with cortisone injections shouldn't apply to the covid vaccine.  If she can tolerate a flu shot, then she can likely proceed with covid vaccine.  The benefit of the vaccine likely outweighs the risk.  Thanks.

## 2019-01-29 ENCOUNTER — Ambulatory Visit: Payer: Medicare Other | Admitting: Cardiovascular Disease

## 2019-03-26 ENCOUNTER — Other Ambulatory Visit: Payer: Self-pay | Admitting: Family Medicine

## 2019-03-26 NOTE — Telephone Encounter (Signed)
Electronic refill request Eliquis Last refill 10/08/18 #60/5 Last office visit 06/20/18

## 2019-06-19 ENCOUNTER — Telehealth: Payer: Self-pay | Admitting: Cardiovascular Disease

## 2019-06-19 NOTE — Telephone Encounter (Signed)
I spoke to the patient and informed her that because of memory and hearing issues, her daughter/grand daughter may accompany her to the appointment 6/7.

## 2019-06-19 NOTE — Telephone Encounter (Signed)
Patient wants to know if her daughter or granddaughter can come with her to her appt on 06/23/19 with Dr. Angelena Form. She states that there is no medical reason but one of them may want to be with her just to hear what the doctor says. Please advise.

## 2019-06-23 ENCOUNTER — Encounter: Payer: Self-pay | Admitting: Cardiovascular Disease

## 2019-06-23 ENCOUNTER — Other Ambulatory Visit: Payer: Self-pay

## 2019-06-23 ENCOUNTER — Ambulatory Visit (INDEPENDENT_AMBULATORY_CARE_PROVIDER_SITE_OTHER): Payer: Medicare Other | Admitting: Cardiovascular Disease

## 2019-06-23 VITALS — BP 142/70 | HR 77 | Ht 59.0 in | Wt 136.8 lb

## 2019-06-23 DIAGNOSIS — E782 Mixed hyperlipidemia: Secondary | ICD-10-CM | POA: Diagnosis not present

## 2019-06-23 DIAGNOSIS — I480 Paroxysmal atrial fibrillation: Secondary | ICD-10-CM

## 2019-06-23 DIAGNOSIS — I340 Nonrheumatic mitral (valve) insufficiency: Secondary | ICD-10-CM | POA: Diagnosis not present

## 2019-06-23 NOTE — Patient Instructions (Signed)
Medication Instructions:  No changes *If you need a refill on your cardiac medications before your next appointment, please call your pharmacy*   Lab Work: None  Testing/Procedures: ECHO DUE SEPT 2021 Your physician has requested that you have an echocardiogram. Echocardiography is a painless test that uses sound waves to create images of your heart. It provides your doctor with information about the size and shape of your heart and how well your heart's chambers and valves are working. This procedure takes approximately one hour. There are no restrictions for this procedure.   Follow-Up: At [facility], you and your health needs are our priority.  As part of our continuing mission to provide you with exceptional heart care, we have created designated Provider Care Teams.  These Care Teams include your primary Cardiologist (physician) and Advanced Practice Providers (APPs -  Physician Assistants and Nurse Practitioners) who all work together to provide you with the care you need, when you need it.  Your next appointment:   12 month(s)  The format for your next appointment:   In Person  Provider:   You may see Buford Dresser, MD or one of the following Advanced Practice Providers on your designated Care Team:    Melina Copa, PA-C  Ermalinda Barrios, PA-C    Other Instructions

## 2019-06-23 NOTE — Progress Notes (Signed)
Chief Complaint  Patient presents with  . Follow-up    PAF    History of Present Illness: 86 y.o. female with history of mitral regurgitation, paroxysmal atrial fibrillation on anticoagulation and hyperlipidemia who is here today for cardiac follow up. She was seen as a virtual e-visit due to the [place] pandemic in May 2020. She was seen as a new patient for initial evaluation on November 2019 by Dr. Buford Dresser. She had been seen previously by Dr Wynonia Lawman. Echo in his office in September 2019 showed normal LV systolic function with UVOZ=36% with moderate MR. She was in sinus on the day of her first visit here. She was continued on Lopressor and Eliquis. She was felt to be appropriate for 5 mg of Eliquis BID. CHADS VASC score 3. I also take care of her husband.   She is here today for follow up. The patient denies any chest pain, dyspnea, palpitations, lower extremity edema, orthopnea, PND, dizziness, near syncope or syncope.   Primary Care Physician: Tonia Ghent, MD   Past Medical History:  Diagnosis Date  . Anxiety   . AR (allergic rhinitis)   . Atrial fibrillation ([place])   . Basal cell carcinoma of ala nasi 2014  . Benign neoplasm stomach body   . Cataract    corrected with surgery  . Colitis   . CTS (carpal tunnel syndrome)   . Diverticulitis   . Diverticulosis   . Esophageal stricture   . Frequent headaches    likely migraine  . GERD (gastroesophageal reflux disease)   . Hepatic steatosis   . Hyperlipidemia   . Hypertension   . IBS (irritable bowel syndrome)   . Insomnia   . Internal hemorrhoids   . Osteopenia     Past Surgical History:  Procedure Laterality Date  . CATARACT EXTRACTION, BILATERAL     August 2018, October 2018  . CHOLECYSTECTOMY    . MOHS SURGERY  2014   nose    Current Outpatient Medications  Medication Sig Dispense Refill  . butalbital-acetaminophen-caffeine (FIORICET) 50-325-40 MG tablet Take 1 tablet by mouth 2 (two)  times daily as needed. 100 tablet 1  . Cholecalciferol (VITAMIN D-3 PO) Take 10,000 Units by mouth See admin instructions. (Monday thru Friday)    . dicyclomine (BENTYL) 10 MG capsule TAKE 1 CAPSULE BY MOUTH UP TO FOUR TIMESA DAY AS NEEDED FOR ABDOMINALCRAMPS 100 capsule 1  . ELIQUIS 2.5 MG TABS tablet TAKE 1 TABLET BY MOUTH TWICE A DAY 60 tablet 5  . ezetimibe (ZETIA) 10 MG tablet TAKE 1 TABLET BY MOUTH DAILY 90 tablet 1  . meclizine (ANTIVERT) 25 MG tablet Take 25 mg by mouth 3 (three) times daily as needed. For vertigo    . Multiple Vitamin (MULTIVITAMIN) tablet Take 1 tablet by mouth daily.    . Probiotic Product (SOLUBLE FIBER/PROBIOTICS PO) Take by mouth.    . rosuvastatin (CRESTOR) 5 MG tablet TAKE 1/2 TABLET BY MOUTH DAILY 45 tablet 1   No current facility-administered medications for this visit.    Allergies  Allergen Reactions  . Aspirin   . Ciprofloxacin   . Influenza Vaccines     Local reaction  . Nexium [Esomeprazole Magnesium]   . Niacin And Related   . Propylene Glycol   . Vagifem [Estradiol]   . Allegra [Fexofenadine]   . Bacitracin-Polymyxin B   . Ceclor [Cefaclor]   . Celebrex [Celecoxib]   . Corticosteroids   . Doxepin Hcl   .  Erythromycin   . Flagyl [Metronidazole]   . Hydrocortisone Other (See Comments)    After use of 1% HC cream, pt reported burning and feeling "scalded" in vaginal area.  . Levofloxacin   . Levsin [Hyoscyamine Sulfate]   . Lidocaine Other (See Comments)    Unknown reaction  . Macrobid [Nitrofurantoin Macrocrystal]   . Marcaine [Bupivacaine Hcl]   . Nsaids   . Penicillins   . Prevacid [Lansoprazole]     Able to tolerate omeprazole.   . Pseudoephedrine   . Sulfonamide Derivatives   . Tavist [Clemastine]   . Cortisone Rash    Any hydrocortisone cream component with severe vaginal burning    Social History   Socioeconomic History  . Marital status: Married    Spouse name: Not on file  . Number of children: Not on file  .  Years of education: Not on file  . Highest education level: Not on file  Occupational History  . Not on file  Tobacco Use  . Smoking status: Never Smoker  . Smokeless tobacco: Never Used  Substance and Sexual Activity  . Alcohol use: No  . Drug use: No  . Sexual activity: Not Currently    Partners: Male  Other Topics Concern  . 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 Difficulty of Paying Living Expenses:   Food Insecurity:   . Worried About Charity fundraiser in the Last Year:   . Arboriculturist in the Last Year:   Transportation Needs:   . Film/video editor (Medical):   Marland Kitchen Lack of Transportation (Non-Medical):   Physical Activity:   . Days of Exercise per Week:   . Minutes of Exercise per Session:   Stress:   . Feeling of Stress :   Social Connections:   . Frequency of Communication with Friends and Family:   . Frequency of Social Gatherings with Friends and Family:   . Attends Religious Services:   . Active Member of Clubs or Organizations:   . Attends Archivist Meetings:   Marland Kitchen Marital Status:   Intimate Partner Violence:   . Fear of Current or Ex-Partner:   . Emotionally Abused:   Marland Kitchen Physically Abused:   . Sexually Abused:     Family History  Problem Relation Age of Onset  . Cancer Other   . Diabetes Other   . CAD Brother   . Leukemia Father   . Pancreatic cancer Brother   . CAD Mother   . Breast cancer Sister   . Mesothelioma Brother   . Colon cancer Neg Hx     Review of Systems:  As stated in the HPI and otherwise negative.   BP (!) 142/70   Pulse 77   Ht 4\' 11"  (1.499 m)   Wt 136 lb 12.8 oz (62.1 kg)   LMP 01/17/1988 (Approximate)   SpO2 96%   BMI 27.63 kg/m   Physical Examination: General: Well developed, well nourished, NAD  HEENT: OP clear, mucus membranes moist    SKIN: warm, dry. No rashes. Neuro: No focal deficits  Musculoskeletal: Muscle strength 5/5 all ext  Psychiatric: Mood and affect normal  Neck: No JVD, no carotid bruits, no thyromegaly, no lymphadenopathy.  Lungs:Clear bilaterally, no wheezes, rhonci, crackles Cardiovascular: Regular rate  and rhythm. No murmurs, gallops or rubs. Abdomen:Soft. Bowel sounds present. Non-tender.  Extremities: No lower extremity edema. Pulses are 2 + in the bilateral DP/PT.  EKG:  EKG is ordered today. The ekg ordered today demonstrates sinus, PACs  Recent Labs: 07/17/2018: Hemoglobin 13.1; Platelets 203.0   Lipid Panel    Component Value Date/Time   CHOL 157 06/13/2018 0947   CHOL 165 10/14/2015 0000   TRIG 197.0 (H) 06/13/2018 0947   TRIG 115 10/14/2015 0000   HDL 42.90 06/13/2018 0947   HDL 51 10/14/2015 0000   CHOLHDL 4 06/13/2018 0947   VLDL 39.4 06/13/2018 0947   LDLCALC 75 06/13/2018 0947   LDLCALC 91 10/14/2015 0000     Wt Readings from Last 3 Encounters:  06/23/19 136 lb 12.8 oz (62.1 kg)  08/27/18 133 lb (60.3 kg)  08/02/18 133 lb 8 oz (60.6 kg)    Assessment and Plan:   1. Atrial fibrillation, paroxysmal: No palpitations. Will continue Eliquis. CHADS VASC score 3. She has Lopressor on her med list but she does not recall ever taking it. Will not restart at this time as she is in sinus and has no palpitations.   2. Hyperlipidemia: Lipids followed in primary care. Will continue statin  3. Mitral regurgitation: Moderate by echo in Dr. Thurman Coyer office September 2019. Will repeat echo now.     Current medicines are reviewed at length with the patient today.  The patient does not have concerns regarding medicines.  The following changes have been made:  no change  Labs/ tests ordered today include:   Orders Placed This Encounter  Procedures  . EKG 12-Lead  . ECHOCARDIOGRAM COMPLETE   Disposition:   FU with me in 12 months.   Signed, Lauree Chandler, MD 06/23/2019 3:17  PM    [facility] [address], [place], [place]  [address] Phone: [phone]; Fax: [phone]

## 2019-06-25 ENCOUNTER — Other Ambulatory Visit: Payer: Self-pay | Admitting: Family Medicine

## 2019-06-26 NOTE — Telephone Encounter (Signed)
Electronic refill request. BAC Last office visit:   06/20/2018 Last Filled:    100 tablet 1 06/20/2018  Please advise.

## 2019-06-27 NOTE — Telephone Encounter (Signed)
Sent. Thanks.   

## 2019-07-15 ENCOUNTER — Telehealth: Payer: Self-pay

## 2019-07-15 NOTE — Telephone Encounter (Signed)
Can/will she send/give a list of questions?  That would help me.

## 2019-07-15 NOTE — Telephone Encounter (Signed)
[name] (daughter) says she is concerned about the patient maybe having Alzheimer's or dementia and it seems to be progressing.  Both daughter and patient's husband have noticed it as well as the [facility] nurse that comes to see Mr. Yu made mention of Babs walking around in circles and not being able to focus.  [name] says the patient is overdue for her CPE and that she may schedule that ASAP and perhaps if a memory test could be performed, it might open the door to this discussion.

## 2019-07-15 NOTE — Telephone Encounter (Signed)
Patient's daughter [name] ocntacted the office and states that she wants to speak with Dr. Damita Dunnings regarding her mom's health. She would not relay any information to me - she just stated it was not urgent, but she has a few questions for Dr. Damita Dunnings when he get's a chance. She states you can call her anytime at [phone].

## 2019-07-16 NOTE — Progress Notes (Signed)
86 y.o. [id number] Married Caucasian female here for annual exam.    Husband has Parkinson's. He requires a lot of care. Patient is tired.  Daughter lives next door and they all are thinking about changing their living situation in the future.   Taking Bentyl for her diverticulosis.   Bladder function is good.  Denies vaginal bleeding.  She likes coffee flavor Ensure.  Seeing her PCP tomorrow at [facility].  Received her Covid vaccines in February.  PCP: Elsie Stain, MD  Patient's last menstrual period was 01/17/1988 (approximate).           Sexually active: No.  The current method of family planning is post menopausal status.    Exercising: No.  The patient does not participate in regular exercise at present. does walk during the day Smoker:  no  Health Maintenance: Pap: 01-21-18 Neg, 11-29-09 Neg History of abnormal Pap:  no MMG:  11-25-18 3D/Neg/density B/BiRads1 Colonoscopy: 1-17/14 diverticulosis, no repeat due to age BMD:   2017  Result :Normal with PCP TDaP:  With PCP Gardasil:   no HIV:not indicated Hep C:not indicated Screening Labs:  PCP.   reports that she has never smoked. She has never used smokeless tobacco. She reports that she does not drink alcohol and does not use drugs.  Past Medical History:  Diagnosis Date  . Anxiety   . AR (allergic rhinitis)   . Atrial fibrillation ([place])   . Basal cell carcinoma of ala nasi 2014  . Benign neoplasm stomach body   . Cataract    corrected with surgery  . Colitis   . CTS (carpal tunnel syndrome)   . Diverticulitis   . Diverticulosis   . Esophageal stricture   . Frequent headaches    likely migraine  . GERD (gastroesophageal reflux disease)   . Hepatic steatosis   . Hyperlipidemia   . Hypertension   . IBS (irritable bowel syndrome)   . Insomnia   . Internal hemorrhoids   . Osteopenia     Past Surgical History:  Procedure Laterality Date  . CATARACT EXTRACTION, BILATERAL     August 2018, October  2018  . CHOLECYSTECTOMY    . MOHS SURGERY  2014   nose    Current Outpatient Medications  Medication Sig Dispense Refill  . BAC 50-325-40 MG tablet TAKE 1 TABLET BY MOUTH 2 TIMES DAILY AS NEEDED. 100 tablet 1  . Cholecalciferol (VITAMIN D-3 PO) Take 10,000 Units by mouth See admin instructions. (Monday thru Friday)    . dicyclomine (BENTYL) 10 MG capsule TAKE 1 CAPSULE BY MOUTH UP TO FOUR TIMESA DAY AS NEEDED FOR ABDOMINALCRAMPS 100 capsule 1  . ELIQUIS 2.5 MG TABS tablet TAKE 1 TABLET BY MOUTH TWICE A DAY 60 tablet 5  . ezetimibe (ZETIA) 10 MG tablet TAKE 1 TABLET BY MOUTH DAILY 90 tablet 1  . meclizine (ANTIVERT) 25 MG tablet Take 25 mg by mouth 3 (three) times daily as needed. For vertigo    . Multiple Vitamin (MULTIVITAMIN) tablet Take 1 tablet by mouth daily.    . Probiotic Product (SOLUBLE FIBER/PROBIOTICS PO) Take by mouth.    . rosuvastatin (CRESTOR) 5 MG tablet TAKE 1/2 TABLET BY MOUTH DAILY 45 tablet 1   No current facility-administered medications for this visit.    Family History  Problem Relation Age of Onset  . Cancer Other   . Diabetes Other   . CAD Brother   . Leukemia Father   . Pancreatic cancer Brother   .  CAD Mother   . Breast cancer Sister   . Mesothelioma Brother   . Colon cancer Neg Hx     Review of Systems  All other systems reviewed and are negative.   Exam:   BP (!) 142/78   Pulse 60   Resp 18   Ht 4' 11.5" (1.511 m)   Wt 136 lb (61.7 kg)   LMP 01/17/1988 (Approximate)   BMI 27.01 kg/m     General appearance: alert, cooperative and appears stated age Head: normocephalic, without obvious abnormality, atraumatic Neck: no adenopathy, supple, symmetrical, trachea midline and thyroid normal to inspection and palpation Lungs: clear to auscultation bilaterally Breasts: normal appearance, no masses or tenderness, No nipple retraction or dimpling, No nipple discharge or bleeding, No axillary adenopathy Heart: regular rate and rhythm Abdomen: soft,  non-tender; no masses, no organomegaly Extremities: extremities normal, atraumatic, no cyanosis or edema Skin: skin color, texture, turgor normal. No rashes or lesions Lymph nodes: cervical, supraclavicular, and axillary nodes normal. Neurologic: grossly normal  Pelvic: External genitalia:  no lesions              No abnormal inguinal nodes palpated.              Urethra:  normal appearing urethra with no masses, tenderness or lesions              Bartholins and Skenes: normal                 Vagina: normal appearing vagina with normal color and discharge, no lesions              Cervix: no lesions              Pap taken: No. Bimanual Exam:  Uterus:  normal size, contour, position, consistency, mobility, non-tender              Adnexa: no mass, fullness, tenderness              Rectal exam: Yes.  .  Confirms.              Anus:  normal sphincter tone, no lesions  Chaperone was present for exam.  Assessment:   Well woman visit with normal exam. Hx afib.  On Elaquis. Osteoporosis.  PCP following.  Back pain.   Plan: Mammogram screening discussed. Self breast awareness reviewed. Pap and HR HPV as above. Guidelines for Calcium, Vitamin D, regular exercise program including cardiovascular and weight bearing exercise. I encouraged a living environment where patient has in home support for her husband. Follow up annually and prn.   After visit summary provided.

## 2019-07-16 NOTE — Telephone Encounter (Signed)
Agreed.  We will check her memory anyway as a routine part of an annual Medicare wellness visit.  Please get this scheduled and we can go from there.  Thanks.

## 2019-07-17 ENCOUNTER — Other Ambulatory Visit: Payer: Self-pay

## 2019-07-17 ENCOUNTER — Encounter: Payer: Self-pay | Admitting: Obstetrics and Gynecology

## 2019-07-17 ENCOUNTER — Ambulatory Visit (INDEPENDENT_AMBULATORY_CARE_PROVIDER_SITE_OTHER): Payer: Medicare Other | Admitting: Obstetrics and Gynecology

## 2019-07-17 VITALS — BP 142/78 | HR 60 | Resp 18 | Ht 59.5 in | Wt 136.0 lb

## 2019-07-17 DIAGNOSIS — Z124 Encounter for screening for malignant neoplasm of cervix: Secondary | ICD-10-CM

## 2019-07-17 DIAGNOSIS — Z01419 Encounter for gynecological examination (general) (routine) without abnormal findings: Secondary | ICD-10-CM

## 2019-07-17 NOTE — Patient Instructions (Signed)

## 2019-07-17 NOTE — Telephone Encounter (Signed)
Appointment has been scheduled.

## 2019-07-18 ENCOUNTER — Encounter: Payer: Self-pay | Admitting: Family Medicine

## 2019-07-18 ENCOUNTER — Ambulatory Visit (INDEPENDENT_AMBULATORY_CARE_PROVIDER_SITE_OTHER): Payer: Medicare Other | Admitting: Family Medicine

## 2019-07-18 VITALS — BP 124/72 | HR 65 | Temp 96.3°F | Ht 59.5 in | Wt 153.2 lb

## 2019-07-18 DIAGNOSIS — K589 Irritable bowel syndrome without diarrhea: Secondary | ICD-10-CM | POA: Diagnosis not present

## 2019-07-18 DIAGNOSIS — E785 Hyperlipidemia, unspecified: Secondary | ICD-10-CM

## 2019-07-18 DIAGNOSIS — Z7189 Other specified counseling: Secondary | ICD-10-CM

## 2019-07-18 DIAGNOSIS — R413 Other amnesia: Secondary | ICD-10-CM | POA: Diagnosis not present

## 2019-07-18 DIAGNOSIS — I480 Paroxysmal atrial fibrillation: Secondary | ICD-10-CM | POA: Diagnosis not present

## 2019-07-18 DIAGNOSIS — Z0000 Encounter for general adult medical examination without abnormal findings: Secondary | ICD-10-CM

## 2019-07-18 DIAGNOSIS — K219 Gastro-esophageal reflux disease without esophagitis: Secondary | ICD-10-CM | POA: Diagnosis not present

## 2019-07-18 DIAGNOSIS — M899 Disorder of bone, unspecified: Secondary | ICD-10-CM

## 2019-07-18 DIAGNOSIS — Z66 Do not resuscitate: Secondary | ICD-10-CM

## 2019-07-18 LAB — CBC WITH DIFFERENTIAL/PLATELET
Basophils Absolute: 0 10*3/uL (ref 0.0–0.1)
Basophils Relative: 0.5 % (ref 0.0–3.0)
Eosinophils Absolute: 0.1 10*3/uL (ref 0.0–0.7)
Eosinophils Relative: 2 % (ref 0.0–5.0)
HCT: 41.8 % (ref 36.0–46.0)
Hemoglobin: 14.2 g/dL (ref 12.0–15.0)
Lymphocytes Relative: 30.9 % (ref 12.0–46.0)
Lymphs Abs: 1.6 10*3/uL (ref 0.7–4.0)
MCHC: 34 g/dL (ref 30.0–36.0)
MCV: 97.5 fl (ref 78.0–100.0)
Monocytes Absolute: 0.5 10*3/uL (ref 0.1–1.0)
Monocytes Relative: 8.8 % (ref 3.0–12.0)
Neutro Abs: 3.1 10*3/uL (ref 1.4–7.7)
Neutrophils Relative %: 57.8 % (ref 43.0–77.0)
Platelets: 200 10*3/uL (ref 150.0–400.0)
RBC: 4.28 Mil/uL (ref 3.87–5.11)
RDW: 13 % (ref 11.5–15.5)
WBC: 5.3 10*3/uL (ref 4.0–10.5)

## 2019-07-18 LAB — COMPREHENSIVE METABOLIC PANEL
ALT: 12 U/L (ref 0–35)
AST: 16 U/L (ref 0–37)
Albumin: 4.3 g/dL (ref 3.5–5.2)
Alkaline Phosphatase: 95 U/L (ref 39–117)
BUN: 11 mg/dL (ref 6–23)
CO2: 29 mEq/L (ref 19–32)
Calcium: 10.9 mg/dL — ABNORMAL HIGH (ref 8.4–10.5)
Chloride: 102 mEq/L (ref 96–112)
Creatinine, Ser: 0.79 mg/dL (ref 0.40–1.20)
GFR: 69.2 mL/min (ref >60.00)
Glucose, Bld: 96 mg/dL (ref 70–99)
Potassium: 4.6 mEq/L (ref 3.5–5.1)
Sodium: 136 mEq/L (ref 135–145)
Total Bilirubin: 0.5 mg/dL (ref 0.2–1.2)
Total Protein: 7.4 g/dL (ref 6.0–8.3)

## 2019-07-18 LAB — TSH: TSH: 2.51 u[IU]/mL (ref 0.35–4.50)

## 2019-07-18 LAB — LIPID PANEL
Cholesterol: 176 mg/dL (ref 0–200)
HDL: 50.8 mg/dL (ref >39.00)
LDL Cholesterol: 86 mg/dL (ref 0–99)
NonHDL: 124.8
Total CHOL/HDL Ratio: 3
Triglycerides: 194 mg/dL — ABNORMAL HIGH (ref 0.0–149.0)
VLDL: 38.8 mg/dL (ref 0.0–40.0)

## 2019-07-18 LAB — VITAMIN D 25 HYDROXY (VIT D DEFICIENCY, FRACTURES): VITD: 77.54 ng/mL (ref 30.00–100.00)

## 2019-07-18 LAB — VITAMIN B12: Vitamin B-12: 328 pg/mL (ref 211–911)

## 2019-07-18 MED ORDER — EZETIMIBE 10 MG PO TABS: ORAL_TABLET | ORAL | Status: DC

## 2019-07-18 MED ORDER — OMEPRAZOLE 20 MG PO CPDR
20.0000 mg | DELAYED_RELEASE_CAPSULE | Freq: Every day | ORAL | Status: DC
Start: 2019-07-18 — End: 2021-06-27

## 2019-07-18 MED ORDER — ROSUVASTATIN CALCIUM 5 MG PO TABS: ORAL_TABLET | ORAL | Status: DC

## 2019-07-18 NOTE — Progress Notes (Signed)
This visit occurred during the SARS-CoV-2 public health emergency.  Safety protocols were in place, including screening questions prior to the visit, additional usage of staff PPE, and extensive cleaning of exam room while observing appropriate contact time as indicated for disinfecting solutions.  I have personally reviewed the Medicare Annual Wellness questionnaire and have noted 1. The patient's medical and social history 2. Their use of alcohol, tobacco or illicit drugs 3. Their current medications and supplements 4. The patient's functional ability including ADL's, fall risks, home safety risks and hearing or visual             impairment. 5. Diet and physical activities 6. Evidence for depression or mood disorders  The patients weight, height, BMI have been recorded in the chart and visual acuity is per eye clinic.  I have made referrals, counseling and provided education to the patient based review of the above and I have provided the pt with a written personalized care plan for preventive services.  Provider list updated- see scanned forms.  Routine anticipatory guidance given to patient.  See health maintenance. The possibility exists that previously documented standard health maintenance information may have been brought forward from a previous encounter into this note.  If needed, that same information has been updated to reflect the current situation based on today's encounter.    Flu 2020 Shingles discussed with patient.  Defer given her history of med intolerances. PNA pneumonia shot discussed with patient. Tetanus tetanus 2012 Covid vaccine 2021 Colon cancer screening not due given her age. Breast cancer screening 2020 Bone density test discussed with patient.  We can consider bone density test later.  Deferred for now after discussion with patient. Advance directive discussed with patient.  She wants DNR status.  Discussed.  If she has apnea or asystole she would not want CPR  done.  Otherwise if she were incapacitated she would want her daughter Doran Clay to speak for her.  DNR form done for patient at office visit. Cognitive function addressed- see scanned forms- and if abnormal then additional documentation follows.   Memory testing done at visit.  Intact short-term memory on testing today.  She is using a list sent a pill organizer to stay organized with her medication.  Her daughter is helping manage her medications/prescriptions.  She is oriented to person place and time today.  We talked about her memory may be affected just by being overwhelmed as a caregiver with her husband's illness.  She has support from her family to help in the meantime.  Her husband has chronic medical concerns, d/w pt.    Elevated Cholesterol: Using medications without problems: yes Muscle aches: likely not from statin but helping move her husband may contribute. Massage and heat help.   Diet compliance: d/w pt.   Exercise: d/w pt.   Labs pending.    AF. Has card f/u recently with f/u echo pending.  No exertional CP.   Prev GI sx improved with bentyl w/o ADE on med.    GERD.  occ PPI use.  No burning but some discomfort that resolves with omeprazole use.  Rare use overall.    PMH and SH reviewed  Meds, vitals, and allergies reviewed.   ROS: Per HPI.  Unless specifically indicated otherwise in HPI, the patient denies:  General: fever. Eyes: acute vision changes ENT: sore throat Cardiovascular: chest pain Respiratory: SOB GI: vomiting GU: dysuria Musculoskeletal: acute back pain Derm: acute rash Neuro: acute motor dysfunction Psych: worsening mood Endocrine:  polydipsia Heme: bleeding Allergy: hayfever  GEN: nad, alert and oriented HEENT: ncat NECK: supple w/o LA CV: rrr. PULM: ctab, no inc wob ABD: soft, +bs EXT: no edema SKIN: no acute rash

## 2019-07-18 NOTE — Patient Instructions (Signed)
Go to the lab on the way out.   If you have mychart we'll likely use that to update you.    I would start using a pill box to keep your meds organized.   Update me as needed.  Take care.  Glad to see you.

## 2019-07-21 ENCOUNTER — Other Ambulatory Visit: Payer: Self-pay | Admitting: Family Medicine

## 2019-07-21 DIAGNOSIS — Z66 Do not resuscitate: Secondary | ICD-10-CM | POA: Insufficient documentation

## 2019-07-21 NOTE — Assessment & Plan Note (Signed)
Prev GI sx improved with bentyl w/o ADE on med.   Would continue as is.

## 2019-07-21 NOTE — Assessment & Plan Note (Signed)
occ PPI use.  No burning but some discomfort that resolves with omeprazole use.  Rare use overall.   Would continue as needed PPI.

## 2019-07-21 NOTE — Assessment & Plan Note (Signed)
Continue Crestor and Zetia for now.  See notes on labs.

## 2019-07-21 NOTE — Assessment & Plan Note (Signed)
Flu 2020 Shingles discussed with patient.  Defer given her history of med intolerances. PNA pneumonia shot discussed with patient. Tetanus tetanus 2012 Covid vaccine 2021 Colon cancer screening not due given her age. Breast cancer screening 2020 Bone density test discussed with patient.  We can consider bone density test later.  Deferred for now after discussion with patient. Advance directive discussed with patient.  She wants DNR status.  Discussed.  If she has apnea or asystole she would not want CPR done.  Otherwise if she were incapacitated she would want her daughter [name] to speak for her.  DNR form done for patient at office visit. Cognitive function addressed- see scanned forms- and if abnormal then additional documentation follows.

## 2019-07-21 NOTE — Assessment & Plan Note (Signed)
Advance directive discussed with patient.  She wants DNR status.  Discussed.  If she has apnea or asystole she would not want CPR done.  Otherwise if she were incapacitated she would want her daughter [name] to speak for her.  DNR form done for patient at office visit.

## 2019-07-21 NOTE — Assessment & Plan Note (Signed)
Has card f/u recently with f/u echo pending.  No exertional CP.  I will await her follow-up echo.  No change in medications at this point.  Continue anticoagulation with Eliquis.

## 2019-07-24 ENCOUNTER — Ambulatory Visit: Payer: Medicare Other | Admitting: Obstetrics and Gynecology

## 2019-07-30 ENCOUNTER — Other Ambulatory Visit: Payer: Self-pay | Admitting: Family Medicine

## 2019-07-30 NOTE — Telephone Encounter (Signed)
Refill request Rosuvastatin Last office visit 07/18/19 Request does not match medication list which shows 1/2 tab 5 days a week Please confirm directions

## 2019-07-30 NOTE — Telephone Encounter (Signed)
Has been taking 1/2 tab 5x/week.  Updated.  rx sent.  Thanks.

## 2019-08-06 ENCOUNTER — Ambulatory Visit (INDEPENDENT_AMBULATORY_CARE_PROVIDER_SITE_OTHER): Payer: Medicare Other | Admitting: Family Medicine

## 2019-08-06 ENCOUNTER — Other Ambulatory Visit: Payer: Self-pay

## 2019-08-06 ENCOUNTER — Encounter: Payer: Self-pay | Admitting: Family Medicine

## 2019-08-06 VITALS — BP 108/60 | HR 72 | Temp 98.4°F | Ht 59.5 in | Wt 137.8 lb

## 2019-08-06 DIAGNOSIS — H9121 Sudden idiopathic hearing loss, right ear: Secondary | ICD-10-CM | POA: Diagnosis not present

## 2019-08-06 DIAGNOSIS — H938X1 Other specified disorders of right ear: Secondary | ICD-10-CM | POA: Diagnosis not present

## 2019-08-06 NOTE — Patient Instructions (Signed)
Good to see you today  You will get a call about referral to ENT specialist  If anything changes- fever, headache, visual change, please let us know  Increase your water

## 2019-08-06 NOTE — Progress Notes (Signed)
Subjective:    Patient ID: [name], female    DOB: [birth year], 86 y.o.   MRN: [id number]  HPI Chief Complaint  Patient presents with   Hearing Loss    right ear hearing loss. Pt states that she was having some itching and hearing my muffled and now she has decreased hearing in the right ear. No trouble with left ear.   This is an 86 year old female accompanied by her daughter [name]. She noticed hearing loss in her right ear that started yesterday.  She has been having some itching of her ear canal but no pain.  No problems with left ear.  Some chronic sinus problems for which she uses saline nasal spray.  Chronic headaches several times a week which are unchanged.  No fever, cough. Of note, she reports a sudden hearing loss of the right ear approximately 30 years ago.  She saw an ENT specialist at that time who suggested surgery which she did not have.  Her hearing loss eventually resolved. Uses cotton swabs in ear canals.    Review of Systems Per HPI    Objective:   Physical Exam Vitals reviewed.  Constitutional:      General: She is not in acute distress.    Appearance: Normal appearance. She is normal weight. She is not ill-appearing, toxic-appearing or diaphoretic.  HENT:     Head: Normocephalic and atraumatic.     Right Ear: External ear normal. Decreased hearing noted. No drainage, swelling or tenderness.     Left Ear: Tympanic membrane, ear canal and external ear normal.     Ears:     Weber exam findings: lateralizes left.    Right Rinne: BC > AC.    Left Rinne: AC > BC.    Comments: Mild erythema of ear canal. TM slightly dull, but no bulging,  Cardiovascular:     Rate and Rhythm: Normal rate.  Pulmonary:     Effort: Pulmonary effort is normal.  Musculoskeletal:     Cervical back: Normal range of motion and neck supple.  Neurological:     Mental Status: She is alert and oriented to person, place, and time.  Psychiatric:        Mood and Affect: Mood  normal.        Behavior: Behavior normal.        Thought Content: Thought content normal.        Judgment: Judgment normal.       BP 108/60 (BP Location: Left Arm, Patient Position: Sitting, Cuff Size: Normal)    Pulse 72    Temp 98.4 F (36.9 C) (Temporal)    Ht 4' 11.5" (1.511 m)    Wt 137 lb 12.8 oz (62.5 kg)    LMP 01/17/1988 (Approximate)    SpO2 95%    BMI 27.37 kg/m  Wt Readings from Last 3 Encounters:  08/06/19 137 lb 12.8 oz (62.5 kg)  07/18/19 153 lb 4 oz (69.5 kg)  07/17/19 136 lb (61.7 kg)       Assessment & Plan:  1. Sudden right hearing loss - discussed with patient and her daughter, unknown etiology, will refer to ent - Ambulatory referral to ENT  2. Irritation of right ear -Does not look infected but mildly abraded.  Advised her not to insert anything into her ear canal and to avoid scratching.  This visit occurred during the SARS-CoV-2 public health emergency.  Safety protocols were in place, including screening questions prior to the visit, additional  usage of staff PPE, and extensive cleaning of exam room while observing appropriate contact time as indicated for disinfecting solutions.      Clarene Reamer, FNP-BC  [facility]  08/06/2019 2:38 PM

## 2019-08-11 ENCOUNTER — Encounter: Payer: Self-pay | Admitting: Family Medicine

## 2019-08-28 ENCOUNTER — Other Ambulatory Visit (INDEPENDENT_AMBULATORY_CARE_PROVIDER_SITE_OTHER): Payer: Medicare Other

## 2019-08-28 ENCOUNTER — Other Ambulatory Visit: Payer: Self-pay

## 2019-08-29 LAB — PTH, INTACT AND CALCIUM
Calcium: 10.5 mg/dL — ABNORMAL HIGH (ref 8.6–10.4)
PTH: 94 pg/mL — ABNORMAL HIGH (ref 14–64)

## 2019-09-05 ENCOUNTER — Encounter: Payer: Self-pay | Admitting: Family Medicine

## 2019-09-05 ENCOUNTER — Other Ambulatory Visit: Payer: Self-pay | Admitting: Family Medicine

## 2019-09-05 DIAGNOSIS — E213 Hyperparathyroidism, unspecified: Secondary | ICD-10-CM | POA: Insufficient documentation

## 2019-09-05 DIAGNOSIS — H35361 Drusen (degenerative) of macula, right eye: Secondary | ICD-10-CM | POA: Diagnosis not present

## 2019-09-05 DIAGNOSIS — H52203 Unspecified astigmatism, bilateral: Secondary | ICD-10-CM | POA: Diagnosis not present

## 2019-09-05 DIAGNOSIS — E210 Primary hyperparathyroidism: Secondary | ICD-10-CM | POA: Insufficient documentation

## 2019-09-05 DIAGNOSIS — Z961 Presence of intraocular lens: Secondary | ICD-10-CM | POA: Diagnosis not present

## 2019-09-05 NOTE — Telephone Encounter (Signed)
Pt would like a call back regarding lab results. 

## 2019-09-15 ENCOUNTER — Other Ambulatory Visit: Payer: Self-pay | Admitting: Family Medicine

## 2019-09-24 ENCOUNTER — Telehealth: Payer: Self-pay

## 2019-09-24 NOTE — Telephone Encounter (Signed)
Opened in error

## 2019-09-29 ENCOUNTER — Other Ambulatory Visit (HOSPITAL_COMMUNITY): Payer: Medicare Other

## 2019-10-08 DIAGNOSIS — E210 Primary hyperparathyroidism: Secondary | ICD-10-CM | POA: Diagnosis not present

## 2019-10-09 ENCOUNTER — Other Ambulatory Visit: Payer: Self-pay | Admitting: Surgery

## 2019-10-10 ENCOUNTER — Other Ambulatory Visit: Payer: Self-pay | Admitting: Surgery

## 2019-10-10 DIAGNOSIS — E210 Primary hyperparathyroidism: Secondary | ICD-10-CM

## 2019-10-23 ENCOUNTER — Other Ambulatory Visit: Payer: Medicare Other

## 2019-10-24 ENCOUNTER — Ambulatory Visit (INDEPENDENT_AMBULATORY_CARE_PROVIDER_SITE_OTHER): Payer: Medicare Other | Admitting: *Deleted

## 2019-10-24 ENCOUNTER — Other Ambulatory Visit: Payer: Self-pay

## 2019-10-24 DIAGNOSIS — Z23 Encounter for immunization: Secondary | ICD-10-CM | POA: Diagnosis not present

## 2019-10-27 ENCOUNTER — Other Ambulatory Visit: Payer: Self-pay

## 2019-10-27 ENCOUNTER — Ambulatory Visit (HOSPITAL_COMMUNITY): Payer: Medicare Other | Attending: Cardiology

## 2019-10-27 DIAGNOSIS — I340 Nonrheumatic mitral (valve) insufficiency: Secondary | ICD-10-CM

## 2019-10-27 DIAGNOSIS — I480 Paroxysmal atrial fibrillation: Secondary | ICD-10-CM | POA: Diagnosis not present

## 2019-10-27 DIAGNOSIS — E782 Mixed hyperlipidemia: Secondary | ICD-10-CM

## 2019-10-27 LAB — ECHOCARDIOGRAM COMPLETE
Area-P 1/2: 2.85 cm2
P 1/2 time: 543 msec
S' Lateral: 2.5 cm

## 2019-10-29 ENCOUNTER — Telehealth: Payer: Self-pay | Admitting: Family Medicine

## 2019-10-29 NOTE — Telephone Encounter (Signed)
Patient had recently noted dark stools.  She was not lightheaded.  She is still anticoagulated.  Previous colonoscopy noted: - A single non-bleeding colonic angiodysplastic lesion. - Moderate diverticulosis in the entire examined colon. - Internal hemorrhoids.  Please call patient.  If she is passing grossly bloody stools or has persistently dark stools or if she is getting lightheaded then we do need to check her labs.  Let me know if she has had continued symptoms/lightheadedness in the meantime.  Thanks.

## 2019-10-30 NOTE — Telephone Encounter (Signed)
Called and spoke with pt's and daughter [name] (on speaker phone), pt said she is still having dark stools but its not every stool that's dark. Pt said it's just "every now and then". Pt said she isn't lightheaded or dizzy, but she did say she does have some stomach pain/cramps with it but she does have IBS so she thinks that could be the cause. Pt said that she isn't passing any bright red blood at all just having dark stools an that's it

## 2019-10-31 ENCOUNTER — Ambulatory Visit
Admission: RE | Admit: 2019-10-31 | Discharge: 2019-10-31 | Disposition: A | Discharge status: Home / Self Care | Payer: Medicare Other | Source: Ambulatory Visit | Attending: Surgery | Admitting: Surgery

## 2019-10-31 DIAGNOSIS — E042 Nontoxic multinodular goiter: Secondary | ICD-10-CM | POA: Diagnosis not present

## 2019-10-31 DIAGNOSIS — E210 Primary hyperparathyroidism: Secondary | ICD-10-CM

## 2019-10-31 NOTE — Telephone Encounter (Signed)
[name] (daughter) advised and states she does not think this is the case at all.

## 2019-10-31 NOTE — Telephone Encounter (Signed)
If the stools are intermittently darker/variable but not black then I would observe for now.  If black or grossly bloody or if lightheaded then let us know.  Thanks.

## 2019-11-05 ENCOUNTER — Telehealth: Payer: Self-pay

## 2019-11-05 NOTE — Telephone Encounter (Signed)
-----   Message from Burnell Blanks, MD sent at 10/27/2019 12:59 PM EDT ----- Her heart is strong. Her mitral valve regurgitation is only mild. Good news. Can we let her know? Thanks, chris

## 2019-11-05 NOTE — Telephone Encounter (Signed)
The patient has been notified of the ECHO result and verbalized understanding.  All questions (if any) were answered. Wilma Flavin, RN 11/05/2019 11:00 AM

## 2019-11-15 ENCOUNTER — Ambulatory Visit: Payer: Medicare Other | Attending: Internal Medicine

## 2019-11-15 DIAGNOSIS — Z23 Encounter for immunization: Secondary | ICD-10-CM

## 2019-11-15 NOTE — Progress Notes (Signed)
° °  Covid-19 Vaccination Clinic  Name:  JAMELLE NOY    MRN: [id number] DOB: 15-Nov-1932  11/15/2019  Ms. Gullo was observed post Covid-19 immunization for 30 minutes based on pre-vaccination screening without incident. She was provided with Vaccine Information Sheet and instruction to access the V-Safe system.   Ms. Wike was instructed to call 911 with any severe reactions post vaccine:  Difficulty breathing   Swelling of face and throat   A fast heartbeat   A bad rash all over body   Dizziness and weakness

## 2019-11-17 ENCOUNTER — Encounter: Payer: Self-pay | Admitting: Family Medicine

## 2019-11-19 NOTE — Telephone Encounter (Signed)
Please request records from Dr. Romana Juniper and please have Dr. Marjory Sneddon clinic contact the patient regarding the plan.  Thanks.  See my chart message from patient.

## 2019-12-15 ENCOUNTER — Other Ambulatory Visit: Payer: Self-pay | Admitting: Family Medicine

## 2019-12-15 NOTE — Telephone Encounter (Signed)
Pharmacy requests refill on: Ezetimibe 10 mg   LAST REFILL: 07/18/2019 LAST OV: 07/18/2019 NEXT OV: Not Scheduled  PHARMACY: [place], [place]

## 2019-12-29 ENCOUNTER — Telehealth: Payer: Self-pay | Admitting: Family Medicine

## 2019-12-29 DIAGNOSIS — M9902 Segmental and somatic dysfunction of thoracic region: Secondary | ICD-10-CM | POA: Diagnosis not present

## 2019-12-29 DIAGNOSIS — S39012A Strain of muscle, fascia and tendon of lower back, initial encounter: Secondary | ICD-10-CM | POA: Diagnosis not present

## 2019-12-29 DIAGNOSIS — M9903 Segmental and somatic dysfunction of lumbar region: Secondary | ICD-10-CM | POA: Diagnosis not present

## 2019-12-29 DIAGNOSIS — M5136 Other intervertebral disc degeneration, lumbar region: Secondary | ICD-10-CM | POA: Diagnosis not present

## 2019-12-29 DIAGNOSIS — M9905 Segmental and somatic dysfunction of pelvic region: Secondary | ICD-10-CM | POA: Diagnosis not present

## 2019-12-29 DIAGNOSIS — M47815 Spondylosis without myelopathy or radiculopathy, thoracolumbar region: Secondary | ICD-10-CM | POA: Diagnosis not present

## 2019-12-29 NOTE — Telephone Encounter (Signed)
If this is for results discussion, then reasonable to change to virtual visit or phone visit.  Thanks.

## 2019-12-29 NOTE — Telephone Encounter (Signed)
Patient's daughter came into office and brought copy of x-ray disk for PCP. Pt was advised to follow up with PCP from chiropractor in regards to possible need for bone density test. Scheduled appointment with PCP as per daughter's request. X-ray CD placed on cart.

## 2019-12-29 NOTE — Telephone Encounter (Signed)
Pt will be coming in.

## 2020-01-01 DIAGNOSIS — M5136 Other intervertebral disc degeneration, lumbar region: Secondary | ICD-10-CM | POA: Diagnosis not present

## 2020-01-01 DIAGNOSIS — M47815 Spondylosis without myelopathy or radiculopathy, thoracolumbar region: Secondary | ICD-10-CM | POA: Diagnosis not present

## 2020-01-01 DIAGNOSIS — S39012A Strain of muscle, fascia and tendon of lower back, initial encounter: Secondary | ICD-10-CM | POA: Diagnosis not present

## 2020-01-01 DIAGNOSIS — M9902 Segmental and somatic dysfunction of thoracic region: Secondary | ICD-10-CM | POA: Diagnosis not present

## 2020-01-01 DIAGNOSIS — M9903 Segmental and somatic dysfunction of lumbar region: Secondary | ICD-10-CM | POA: Diagnosis not present

## 2020-01-01 DIAGNOSIS — M9905 Segmental and somatic dysfunction of pelvic region: Secondary | ICD-10-CM | POA: Diagnosis not present

## 2020-01-02 ENCOUNTER — Ambulatory Visit (INDEPENDENT_AMBULATORY_CARE_PROVIDER_SITE_OTHER): Payer: Medicare Other | Admitting: Family Medicine

## 2020-01-02 ENCOUNTER — Encounter: Payer: Self-pay | Admitting: Family Medicine

## 2020-01-02 ENCOUNTER — Ambulatory Visit (INDEPENDENT_AMBULATORY_CARE_PROVIDER_SITE_OTHER)
Admission: RE | Admit: 2020-01-02 | Discharge: 2020-01-02 | Disposition: A | Discharge status: Home / Self Care | Payer: Medicare Other | Source: Ambulatory Visit | Attending: Family Medicine | Admitting: Family Medicine

## 2020-01-02 ENCOUNTER — Other Ambulatory Visit: Payer: Self-pay

## 2020-01-02 VITALS — BP 124/70 | HR 59 | Temp 96.2°F | Ht 62.0 in | Wt 136.7 lb

## 2020-01-02 DIAGNOSIS — M545 Low back pain, unspecified: Secondary | ICD-10-CM

## 2020-01-02 DIAGNOSIS — M8580 Other specified disorders of bone density and structure, unspecified site: Secondary | ICD-10-CM

## 2020-01-02 DIAGNOSIS — E213 Hyperparathyroidism, unspecified: Secondary | ICD-10-CM

## 2020-01-02 NOTE — Patient Instructions (Addendum)
Go to the lab on the way out.   If you have mychart we'll likely use that to update you.    Take care.  Glad to see you. We'll call about seeing endocrine. Don't change your meds for now.

## 2020-01-02 NOTE — Progress Notes (Signed)
This visit occurred during the SARS-CoV-2 public health emergency.  Safety protocols were in place, including screening questions prior to the visit, additional usage of staff PPE, and extensive cleaning of exam room while observing appropriate contact time as indicated for disinfecting solutions.  Back pain.    She had fallen twice in the last few months.  Cautions d/w pt.  She had prev been lifting her husband, but she is not doing that now.  Her husband is chronically ill, patient and daughter are caring for him.  Discussed.    She thought she was on fosamax in the distant past, but not in the last 6+ years.  I looked back through her old records but I did not see listing of Fosamax use.  She is using heat and massager for pain.  No radicular pain.  No FCNAVD.  She has lower back pain, but now at the sacrum.    She had outside films done but they were poor quality we talked about repeating those today.  It did look like she had partial compression fracture with outside films on the lower L-spine but we need better image quality.  See notes on follow-up imaging from today.  She has a history of hyperparathyroidism but she had not gotten a call about referral to endocrinology yet.  She had general surgery follow-up in the meantime.  Previous general surgery note and plan discussed with patient at office visit, records reviewed with patient.  Referral to endocrinology placed.  Meds, vitals, and allergies reviewed.   ROS: Per HPI unless specifically indicated in ROS section   GEN: nad, alert and oriented HEENT: ncat NECK: supple w/o LA CV: rrr PULM: ctab, no inc wob ABD: soft, +bs EXT: no edema SKIN: Well-perfused Back not tender to palpation but she has pain in the lower midline back.  Able to bear weight.

## 2020-01-04 NOTE — Assessment & Plan Note (Signed)
Refer to endocrinology

## 2020-01-04 NOTE — Assessment & Plan Note (Signed)
With hyperparathyroidism noted.  Refer to endocrinology.

## 2020-01-04 NOTE — Assessment & Plan Note (Signed)
See notes on follow-up imaging.  We need better image quality.  Discussed.  No change in medications at this point.  30 minutes were devoted to patient care in this encounter (this includes time spent reviewing the patient's file/history, interviewing and examining the patient, counseling/reviewing plan with patient).

## 2020-01-05 DIAGNOSIS — S39012A Strain of muscle, fascia and tendon of lower back, initial encounter: Secondary | ICD-10-CM | POA: Diagnosis not present

## 2020-01-05 DIAGNOSIS — M9905 Segmental and somatic dysfunction of pelvic region: Secondary | ICD-10-CM | POA: Diagnosis not present

## 2020-01-05 DIAGNOSIS — M9903 Segmental and somatic dysfunction of lumbar region: Secondary | ICD-10-CM | POA: Diagnosis not present

## 2020-01-05 DIAGNOSIS — M9902 Segmental and somatic dysfunction of thoracic region: Secondary | ICD-10-CM | POA: Diagnosis not present

## 2020-01-05 DIAGNOSIS — M47815 Spondylosis without myelopathy or radiculopathy, thoracolumbar region: Secondary | ICD-10-CM | POA: Diagnosis not present

## 2020-01-05 DIAGNOSIS — M5136 Other intervertebral disc degeneration, lumbar region: Secondary | ICD-10-CM | POA: Diagnosis not present

## 2020-01-08 DIAGNOSIS — S39012A Strain of muscle, fascia and tendon of lower back, initial encounter: Secondary | ICD-10-CM | POA: Diagnosis not present

## 2020-01-08 DIAGNOSIS — M9902 Segmental and somatic dysfunction of thoracic region: Secondary | ICD-10-CM | POA: Diagnosis not present

## 2020-01-08 DIAGNOSIS — M47815 Spondylosis without myelopathy or radiculopathy, thoracolumbar region: Secondary | ICD-10-CM | POA: Diagnosis not present

## 2020-01-08 DIAGNOSIS — M9903 Segmental and somatic dysfunction of lumbar region: Secondary | ICD-10-CM | POA: Diagnosis not present

## 2020-01-08 DIAGNOSIS — M9905 Segmental and somatic dysfunction of pelvic region: Secondary | ICD-10-CM | POA: Diagnosis not present

## 2020-01-08 DIAGNOSIS — M5136 Other intervertebral disc degeneration, lumbar region: Secondary | ICD-10-CM | POA: Diagnosis not present

## 2020-01-12 DIAGNOSIS — M9903 Segmental and somatic dysfunction of lumbar region: Secondary | ICD-10-CM | POA: Diagnosis not present

## 2020-01-12 DIAGNOSIS — M9902 Segmental and somatic dysfunction of thoracic region: Secondary | ICD-10-CM | POA: Diagnosis not present

## 2020-01-12 DIAGNOSIS — M5136 Other intervertebral disc degeneration, lumbar region: Secondary | ICD-10-CM | POA: Diagnosis not present

## 2020-01-12 DIAGNOSIS — M9905 Segmental and somatic dysfunction of pelvic region: Secondary | ICD-10-CM | POA: Diagnosis not present

## 2020-01-12 DIAGNOSIS — S39012A Strain of muscle, fascia and tendon of lower back, initial encounter: Secondary | ICD-10-CM | POA: Diagnosis not present

## 2020-01-12 DIAGNOSIS — M47815 Spondylosis without myelopathy or radiculopathy, thoracolumbar region: Secondary | ICD-10-CM | POA: Diagnosis not present

## 2020-01-26 LAB — HM MAMMOGRAPHY

## 2020-02-26 ENCOUNTER — Other Ambulatory Visit: Payer: Self-pay

## 2020-03-01 ENCOUNTER — Ambulatory Visit: Payer: Medicare Other | Admitting: Endocrinology

## 2020-03-12 ENCOUNTER — Other Ambulatory Visit: Payer: Self-pay | Admitting: Family Medicine

## 2020-03-15 ENCOUNTER — Other Ambulatory Visit: Payer: Self-pay | Admitting: Family Medicine

## 2020-03-15 NOTE — Telephone Encounter (Signed)
Refill request Eliquis Last refill 09/15/19 #60/5 Last office visit 01/02/20

## 2020-03-16 NOTE — Telephone Encounter (Signed)
Sent. Thanks.   

## 2020-04-06 ENCOUNTER — Other Ambulatory Visit: Payer: Self-pay

## 2020-04-06 ENCOUNTER — Encounter: Payer: Self-pay | Admitting: Endocrinology

## 2020-04-06 ENCOUNTER — Ambulatory Visit (INDEPENDENT_AMBULATORY_CARE_PROVIDER_SITE_OTHER): Payer: Medicare Other | Admitting: Endocrinology

## 2020-04-06 ENCOUNTER — Telehealth: Payer: Self-pay

## 2020-04-06 VITALS — BP 130/80 | HR 72 | Ht 60.0 in | Wt 136.2 lb

## 2020-04-06 DIAGNOSIS — E213 Hyperparathyroidism, unspecified: Secondary | ICD-10-CM

## 2020-04-06 LAB — VITAMIN D 25 HYDROXY (VIT D DEFICIENCY, FRACTURES): VITD: 117.16 ng/mL (ref 30.00–100.00)

## 2020-04-06 NOTE — Progress Notes (Signed)
Subjective:    Patient ID: [name], female    DOB: Oct 21, 1932, 87 y.o.   MRN: [id number]  HPI Pt is referred by Dr Kae Heller, for hypercalcemia.  Pt was noted to have hypercalcemia in 2016.  she has never had osteoporosis, urolithiasis, sarcoidosis, cancer, PUD, pancreatitis, or bony fracture.  she does not take vitamin-A supplement.  Pt denies taking antacids, Li++, or HCTZ.  She takes Vit-D, 10000 units, 5 times per week.  Main symptom is low back pain.  She also has frequent urination.   Past Medical History:  Diagnosis Date  . Anxiety   . AR (allergic rhinitis)   . Atrial fibrillation ([place])   . Basal cell carcinoma of ala nasi 2014  . Benign neoplasm stomach body   . Cataract    corrected with surgery  . Colitis   . CTS (carpal tunnel syndrome)   . Diverticulitis   . Diverticulosis   . Esophageal stricture   . Frequent headaches    likely migraine  . GERD (gastroesophageal reflux disease)   . Hepatic steatosis   . Hyperlipidemia   . Hypertension   . IBS (irritable bowel syndrome)   . Insomnia   . Internal hemorrhoids   . Osteopenia     Past Surgical History:  Procedure Laterality Date  . CATARACT EXTRACTION, BILATERAL     August 2018, October 2018  . CHOLECYSTECTOMY    . MOHS SURGERY  2014   nose    Social History   Socioeconomic History  . Marital status: Married    Spouse name: Not on file  . Number of children: Not on file  . Years of education: Not on file  . Highest education level: Not on file  Occupational History  . Not on file  Tobacco Use  . Smoking status: Never Smoker  . Smokeless tobacco: Never Used  Vaping Use  . Vaping Use: Never used  Substance and Sexual Activity  . Alcohol use: No  . Drug use: No  . Sexual activity: Not Currently    Partners: Male  Other Topics Concern  . 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 BAC 50-325-40 MG tablet TAKE 1 TABLET BY MOUTH 2 TIMES DAILY AS NEEDED. 100 tablet 1  . dicyclomine (BENTYL) 10 MG capsule TAKE 1 CAPSULE BY MOUTH UP TO 4 TIMES A DAY AS NEEDED FOR ABDOMINAL CRAMPS 100 capsule 1  . ELIQUIS 2.5 MG TABS tablet TAKE 1 TABLET BY MOUTH TWICE A DAY 60 tablet 5  . ezetimibe (ZETIA) 10 MG tablet TAKE 1 TABLET BY MOUTH DAILY 90 tablet 1  . meclizine (ANTIVERT) 25 MG tablet Take 25 mg by mouth 3 (three) times daily as needed. For vertigo    . melatonin 5 MG TABS Take 5 mg by mouth.    . Multiple Vitamin (MULTIVITAMIN) tablet Take 1 tablet by mouth daily.    Marland Kitchen omeprazole (PRILOSEC) 20 MG capsule Take 1 capsule (20 mg total)  by mouth daily.    . Probiotic Product (SOLUBLE FIBER/PROBIOTICS PO) Take by mouth.    . rosuvastatin (CRESTOR) 5 MG tablet TAKE 1/2 TABLET BY MOUTH 5 DAYS A WEEK 45 tablet 1   No current facility-administered medications on file prior to visit.    Allergies  Allergen Reactions  . Aspirin   . Ciprofloxacin   . Influenza Vaccines     Local reaction  . Nexium [Esomeprazole Magnesium]   . Niacin And Related   . Propylene Glycol   . Vagifem [Estradiol]   . Allegra [Fexofenadine]   . Bacitracin-Polymyxin B   . Ceclor [Cefaclor]   . Celebrex [Celecoxib]   . Corticosteroids   . Doxepin Hcl   . Erythromycin   . Flagyl [Metronidazole]   . Hydrocortisone Other (See Comments)    After use of 1% HC cream, pt reported burning and feeling "scalded" in vaginal area.  . Levofloxacin   . Levsin [Hyoscyamine Sulfate]   . Lidocaine Other (See Comments)    Unknown reaction  . Macrobid [Nitrofurantoin  Macrocrystal]   . Marcaine [Bupivacaine Hcl]   . Nsaids   . Penicillins   . Prevacid [Lansoprazole]     Able to tolerate omeprazole.   . Pseudoephedrine   . Sulfonamide Derivatives   . Tavist [Clemastine]   . Cortisone Rash    Any hydrocortisone cream component with severe vaginal burning    Family History  Problem Relation Age of Onset  . Cancer Other   . Diabetes Other   . CAD Brother   . Leukemia Father   . Pancreatic cancer Brother   . CAD Mother   . Breast cancer Sister   . Mesothelioma Brother   . Colon cancer Neg Hx   . Hypercalcemia Neg Hx     BP 130/80 (BP Location: Right Arm, Patient Position: Sitting, Cuff Size: Normal)   Pulse 72   Ht 5' (1.524 m)   Wt 136 lb 3.2 oz (61.8 kg)   LMP 01/17/1988 (Approximate)   SpO2 94%   BMI 26.60 kg/m      Review of Systems Denies weight loss, depression, and numbness.       Objective:   Physical Exam VITAL SIGNS:  See vs page GENERAL: no distress NECK: There is no palpable thyroid enlargement.  No thyroid nodule is palpable.  No palpable lymphadenopathy at the anterior neck. SPINE: no kyphosis Ext: no leg edema GAIT: normal and steady   Lab Results  Component Value Date   PTH 94 (H) 08/28/2019   CALCIUM 10.5 (H) 08/28/2019   PHOS 4.0 06/24/2008   25-OH Vit-D=78  [place]: MNG  I have reviewed outside records, and summarized: Pt was noted to have elevated Ca++, and referred here.  Main prob addressed was back pain.      Assessment & Plan:  Hyperparathyroidism, prob primary.  No current indication for surgery.   Patient Instructions  Blood tests are requested for you today.  We'll let you know about the results.  If these tests are just mildly abnormal, we'll just plan to recheck in the future Please call [phone] to schedule a Bone Density (DexaScan) a the [place] office at [place]. Please come back for a follow-up appointment in 6 months.

## 2020-04-06 NOTE — Patient Instructions (Signed)
Blood tests are requested for you today.  We'll let you know about the results.  If these tests are just mildly abnormal, we'll just plan to recheck in the future Please call [phone] to schedule a Bone Density (DexaScan) a the [place] office at [place]. Please come back for a follow-up appointment in 6 months.

## 2020-04-06 NOTE — Telephone Encounter (Signed)
Unable to reach pt regarding Lab results bc VM not set up. I sent a message thru [facility]

## 2020-04-07 LAB — PTH, INTACT AND CALCIUM
Calcium: 10.6 mg/dL — ABNORMAL HIGH (ref 8.6–10.4)
PTH: 55 pg/mL (ref 16–77)

## 2020-04-19 ENCOUNTER — Other Ambulatory Visit: Payer: Self-pay | Admitting: Family Medicine

## 2020-05-03 DIAGNOSIS — D0461 Carcinoma in situ of skin of right upper limb, including shoulder: Secondary | ICD-10-CM | POA: Diagnosis not present

## 2020-05-03 DIAGNOSIS — D485 Neoplasm of uncertain behavior of skin: Secondary | ICD-10-CM | POA: Diagnosis not present

## 2020-05-03 DIAGNOSIS — Z85828 Personal history of other malignant neoplasm of skin: Secondary | ICD-10-CM | POA: Diagnosis not present

## 2020-05-03 DIAGNOSIS — L814 Other melanin hyperpigmentation: Secondary | ICD-10-CM | POA: Diagnosis not present

## 2020-05-03 DIAGNOSIS — L570 Actinic keratosis: Secondary | ICD-10-CM | POA: Diagnosis not present

## 2020-05-11 ENCOUNTER — Other Ambulatory Visit: Payer: Self-pay | Admitting: Family Medicine

## 2020-05-11 NOTE — Telephone Encounter (Signed)
Refill request for BUTALBITAL/APAP/CAFFEINE TB 50-325-40 Tablet  LOV - 01/02/20 Next OV - not scheduled Last refill - 06/27/19 #100/1

## 2020-05-12 NOTE — Telephone Encounter (Signed)
Sent. Thanks.   

## 2020-06-30 ENCOUNTER — Ambulatory Visit (INDEPENDENT_AMBULATORY_CARE_PROVIDER_SITE_OTHER)
Admission: RE | Admit: 2020-06-30 | Discharge: 2020-06-30 | Disposition: A | Discharge status: Home / Self Care | Payer: Medicare Other | Source: Ambulatory Visit | Attending: Endocrinology | Admitting: Endocrinology

## 2020-06-30 ENCOUNTER — Other Ambulatory Visit: Payer: Self-pay

## 2020-06-30 DIAGNOSIS — E213 Hyperparathyroidism, unspecified: Secondary | ICD-10-CM | POA: Diagnosis not present

## 2020-07-03 ENCOUNTER — Encounter: Payer: Self-pay | Admitting: Endocrinology

## 2020-07-20 ENCOUNTER — Other Ambulatory Visit: Payer: Self-pay

## 2020-07-20 ENCOUNTER — Ambulatory Visit (INDEPENDENT_AMBULATORY_CARE_PROVIDER_SITE_OTHER): Payer: Medicare Other | Admitting: Obstetrics and Gynecology

## 2020-07-20 ENCOUNTER — Other Ambulatory Visit (HOSPITAL_COMMUNITY)
Admission: RE | Admit: 2020-07-20 | Discharge: 2020-07-20 | Disposition: A | Discharge status: Home / Self Care | Payer: Medicare Other | Source: Ambulatory Visit | Attending: Obstetrics and Gynecology | Admitting: Obstetrics and Gynecology

## 2020-07-20 ENCOUNTER — Ambulatory Visit: Payer: Medicare Other | Admitting: Obstetrics and Gynecology

## 2020-07-20 VITALS — BP 144/82 | Ht 60.0 in | Wt 136.0 lb

## 2020-07-20 DIAGNOSIS — Z01419 Encounter for gynecological examination (general) (routine) without abnormal findings: Secondary | ICD-10-CM

## 2020-07-20 DIAGNOSIS — Z008 Encounter for other general examination: Secondary | ICD-10-CM

## 2020-07-20 DIAGNOSIS — R600 Localized edema: Secondary | ICD-10-CM

## 2020-07-20 DIAGNOSIS — Z124 Encounter for screening for malignant neoplasm of cervix: Secondary | ICD-10-CM | POA: Insufficient documentation

## 2020-07-20 DIAGNOSIS — F439 Reaction to severe stress, unspecified: Secondary | ICD-10-CM

## 2020-07-20 DIAGNOSIS — Z1239 Encounter for other screening for malignant neoplasm of breast: Secondary | ICD-10-CM

## 2020-07-20 NOTE — Patient Instructions (Signed)

## 2020-07-20 NOTE — Progress Notes (Signed)
87 y.o. G50P0002 Married Caucasian female here for breast and pelvic exam.    Husband is recovering at [facility] after being in the hospital for a blockage of her ostomy.  She is feeling stress.  Patient is staying with her sister.  Good support from her daughters.  One of her daughter is the [place].   Having bilateral LE swelling.  Sitting a lot recently.   Denies vaginal or discharge.  Voiding often.  Drinks 1 caffeine coffee per day.  BMs are more constipation, long term.   PCP: Elsie Stain   Patient's last menstrual period was 01/17/1988 (approximate).           Sexually active: No.  NOt active for a while. The current method of family planning is not needed, patient is post-menopausal.  Exercising: Yes.    Walks a few times a week Smoker:  no  Health Maintenance: Pap:  01/21/2018 Normal  History of abnormal Pap:  no MMG:  01/26/20 Bi-rads 1 neg  Colonoscopy:  1-17/14 diverticulosis, no repeat due to age BMD:   07/02/20 Result  osteoporosis.  TDaP:  With PCP  Gardasil:   NA HIV: Hep C: Screening Labs:  Hb today: none, Urine today: none   reports that she has never smoked. She has never used smokeless tobacco. She reports that she does not drink alcohol and does not use drugs.  Past Medical History:  Diagnosis Date   Anxiety    AR (allergic rhinitis)    Atrial fibrillation ([facility])    Basal cell carcinoma of ala nasi 2014   Benign neoplasm stomach body    Cataract    corrected with surgery   Colitis    CTS (carpal tunnel syndrome)    Diverticulitis    Diverticulosis    Esophageal stricture    Frequent headaches    likely migraine   GERD (gastroesophageal reflux disease)    Hepatic steatosis    Hyperlipidemia    Hypertension    IBS (irritable bowel syndrome)    Insomnia    Internal hemorrhoids    Osteopenia     Past Surgical History:  Procedure Laterality Date   CATARACT EXTRACTION, BILATERAL     August 2018, October 2018    CHOLECYSTECTOMY     MOHS SURGERY  2014   nose    Current Outpatient Medications  Medication Sig Dispense Refill   BAC 50-325-40 MG tablet TAKE 1 TABLET BY MOUTH 2 TIMES DAILY AS NEEDED. 100 tablet 1   dicyclomine (BENTYL) 10 MG capsule TAKE 1 CAPSULE BY MOUTH UP TO 4 TIMES A DAY AS NEEDED FOR ABDOMINAL CRAMPS 100 capsule 1   ELIQUIS 2.5 MG TABS tablet TAKE 1 TABLET BY MOUTH TWICE A DAY 60 tablet 5   ezetimibe (ZETIA) 10 MG tablet TAKE 1 TABLET BY MOUTH DAILY 90 tablet 1   meclizine (ANTIVERT) 25 MG tablet Take 25 mg by mouth 3 (three) times daily as needed. For vertigo     melatonin 5 MG TABS Take 5 mg by mouth.     Multiple Vitamin (MULTIVITAMIN) tablet Take 1 tablet by mouth daily.     omeprazole (PRILOSEC) 20 MG capsule Take 1 capsule (20 mg total) by mouth daily.     Probiotic Product (SOLUBLE FIBER/PROBIOTICS PO) Take by mouth.     rosuvastatin (CRESTOR) 5 MG tablet TAKE 1/2 TABLET BY MOUTH 5 DAYS A WEEK 30 tablet 1   No current facility-administered medications for this visit.    Family  History  Problem Relation Age of Onset   Cancer Other    Diabetes Other    CAD Brother    Leukemia Father    Pancreatic cancer Brother    CAD Mother    Breast cancer Sister    Mesothelioma Brother    Colon cancer Neg Hx    Hypercalcemia Neg Hx     Review of Systems  See HPI.  Exam:   BP (!) 144/82 (BP Location: Left Arm, Patient Position: Sitting, Cuff Size: Normal)   Ht 5' (1.524 m)   Wt 136 lb (61.7 kg)   LMP 01/17/1988 (Approximate)   BMI 26.56 kg/m     General appearance: alert, cooperative and appears stated age Head: normocephalic, without obvious abnormality, atraumatic Neck: no adenopathy, supple, symmetrical, trachea midline and thyroid normal to inspection and palpation Lungs: clear to auscultation bilaterally Breasts: normal appearance, no masses or tenderness, No nipple retraction or dimpling, No nipple discharge or bleeding, No axillary adenopathy Heart: regular rate  and rhythm Abdomen: soft, non-tender; no masses, no organomegaly Extremities: extremities normal, atraumatic, no cyanosis or edema Skin: skin color, texture, turgor normal. No rashes or lesions Lymph nodes: cervical, supraclavicular, and axillary nodes normal. Neurologic: grossly normal  Pelvic: External genitalia:  no lesions              No abnormal inguinal nodes palpated.              Urethra:  normal appearing urethra with no masses, tenderness or lesions              Bartholins and Skenes: normal                 Vagina: normal appearing vagina with normal color and discharge, no lesions              Cervix: no lesions              Pap taken:  yes. Bimanual Exam:  Uterus:  normal size, contour, position, consistency, mobility, non-tender              Adnexa: no mass, fullness, tenderness              Rectal exam: yes.  Confirms.              Anus:  normal sphincter tone, no lesions  Chaperone was present for exam.  Assessment:   Cervical cancer screening.  Pelvic exam with abnormal findings absent. Screening breast exam.  FH breast cancer.  Rectal exam. Situational stress.  Bilateral LE edema. Hx afib.  On Elaquis. Hyperparathyroidism.  Elevated vit D.   Osteoporosis.  PCP following.  Plan: Mammogram screening discussed. Self breast awareness reviewed. Pap and HR HPV as above. We discussed widening her support circle.  She will start learning about assisted living opportunities.  Support hose recommended.  I educated her to go do [employer] for this. She will reach out to her PCP if her anxiety or LE edema increase further.  Follow up in 2 years and prn.   After visit summary provided.   35 min  total time was spent for this patient encounter, including preparation, face-to-face counseling with the patient, coordination of care, and documentation of the encounter.

## 2020-07-21 ENCOUNTER — Encounter: Payer: Self-pay | Admitting: Obstetrics and Gynecology

## 2020-07-21 LAB — CYTOLOGY - PAP: Diagnosis: NEGATIVE

## 2020-08-13 ENCOUNTER — Ambulatory Visit: Payer: Medicare Other | Admitting: Family Medicine

## 2020-08-23 ENCOUNTER — Other Ambulatory Visit: Payer: Self-pay | Admitting: Family Medicine

## 2020-08-27 ENCOUNTER — Other Ambulatory Visit: Payer: Self-pay

## 2020-08-27 ENCOUNTER — Ambulatory Visit (INDEPENDENT_AMBULATORY_CARE_PROVIDER_SITE_OTHER): Payer: Medicare Other | Admitting: Family Medicine

## 2020-08-27 ENCOUNTER — Encounter: Payer: Self-pay | Admitting: Family Medicine

## 2020-08-27 VITALS — BP 128/76 | HR 68 | Temp 97.9°F | Ht 60.0 in | Wt 135.0 lb

## 2020-08-27 DIAGNOSIS — M899 Disorder of bone, unspecified: Secondary | ICD-10-CM | POA: Diagnosis not present

## 2020-08-27 DIAGNOSIS — M545 Low back pain, unspecified: Secondary | ICD-10-CM | POA: Diagnosis not present

## 2020-08-27 DIAGNOSIS — R413 Other amnesia: Secondary | ICD-10-CM

## 2020-08-27 DIAGNOSIS — E785 Hyperlipidemia, unspecified: Secondary | ICD-10-CM

## 2020-08-27 DIAGNOSIS — M810 Age-related osteoporosis without current pathological fracture: Secondary | ICD-10-CM

## 2020-08-27 LAB — COMPREHENSIVE METABOLIC PANEL
ALT: 11 U/L (ref 0–35)
AST: 15 U/L (ref 0–37)
Albumin: 4.1 g/dL (ref 3.5–5.2)
Alkaline Phosphatase: 100 U/L (ref 39–117)
BUN: 10 mg/dL (ref 6–23)
CO2: 28 mEq/L (ref 19–32)
Calcium: 11 mg/dL — ABNORMAL HIGH (ref 8.4–10.5)
Chloride: 102 mEq/L (ref 96–112)
Creatinine, Ser: 0.77 mg/dL (ref 0.40–1.20)
GFR: 70.09 mL/min (ref >60.00)
Glucose, Bld: 88 mg/dL (ref 70–99)
Potassium: 4.9 mEq/L (ref 3.5–5.1)
Sodium: 138 mEq/L (ref 135–145)
Total Bilirubin: 0.7 mg/dL (ref 0.2–1.2)
Total Protein: 7 g/dL (ref 6.0–8.3)

## 2020-08-27 LAB — LIPID PANEL
Cholesterol: 162 mg/dL (ref 0–200)
HDL: 53.7 mg/dL (ref >39.00)
LDL Cholesterol: 69 mg/dL (ref 0–99)
NonHDL: 107.8
Total CHOL/HDL Ratio: 3
Triglycerides: 194 mg/dL — ABNORMAL HIGH (ref 0.0–149.0)
VLDL: 38.8 mg/dL (ref 0.0–40.0)

## 2020-08-27 LAB — CBC WITH DIFFERENTIAL/PLATELET
Basophils Absolute: 0 10*3/uL (ref 0.0–0.1)
Basophils Relative: 0.9 % (ref 0.0–3.0)
Eosinophils Absolute: 0.1 10*3/uL (ref 0.0–0.7)
Eosinophils Relative: 2.4 % (ref 0.0–5.0)
HCT: 41.5 % (ref 36.0–46.0)
Hemoglobin: 14 g/dL (ref 12.0–15.0)
Lymphocytes Relative: 25 % (ref 12.0–46.0)
Lymphs Abs: 1.3 10*3/uL (ref 0.7–4.0)
MCHC: 33.8 g/dL (ref 30.0–36.0)
MCV: 98.2 fl (ref 78.0–100.0)
Monocytes Absolute: 0.5 10*3/uL (ref 0.1–1.0)
Monocytes Relative: 8.5 % (ref 3.0–12.0)
Neutro Abs: 3.4 10*3/uL (ref 1.4–7.7)
Neutrophils Relative %: 63.2 % (ref 43.0–77.0)
Platelets: 215 10*3/uL (ref 150.0–400.0)
RBC: 4.23 Mil/uL (ref 3.87–5.11)
RDW: 12.4 % (ref 11.5–15.5)
WBC: 5.3 10*3/uL (ref 4.0–10.5)

## 2020-08-27 LAB — VITAMIN D 25 HYDROXY (VIT D DEFICIENCY, FRACTURES): VITD: 69.62 ng/mL (ref 30.00–100.00)

## 2020-08-27 LAB — TSH: TSH: 2.14 u[IU]/mL (ref 0.35–5.50)

## 2020-08-27 LAB — VITAMIN B12: Vitamin B-12: 516 pg/mL (ref 211–911)

## 2020-08-27 NOTE — Progress Notes (Signed)
This visit occurred during the SARS-CoV-2 public health emergency.  Safety protocols were in place, including screening questions prior to the visit, additional usage of staff PPE, and extensive cleaning of exam room while observing appropriate contact time as indicated for disinfecting solutions.  Bone density d/w pt.  PTH hx and high vit D noted, d/w pt. DXA 2022 with OSTEOPOROSIS.  She thought she was on fosamax in the distant past. I looked back through her old records but I did not see listing of Fosamax use.  Memory d/w pt.  "Not too good" per patient report.  She is repeating questions, she is aware.  Short term recall is the issue.  We talked about looking for reversible issues versus primary dementia.  See notes on labs.  F/u lipids pending.    Daily lower back pain, massage helps. No radicular pain.  Sensation intact BLE.  Discussed trying tylenol for now.  Previous imaging noted.  IMPRESSION: 1. No acute osseous abnormality. 2. Progressive moderate to severe degenerative disc disease at T12-L1 and L1-L2. 3. New facet mediated 4 mm anterolisthesis at L4-L5.  Meds, vitals, and allergies reviewed.   ROS: Per HPI unless specifically indicated in ROS section   GEN: nad, alert and oriented HEENT: ncat NECK: supple w/o LA CV: rrr.   PULM: ctab, no inc wob ABD: soft, +bs EXT: no edema SKIN: Well-perfused.  MMSE 27/30.  -1 for orientation, -2 for recall.  40 minutes were devoted to patient care in this encounter (this includes time spent reviewing the patient's file/history, interviewing and examining the patient, counseling/reviewing plan with patient).

## 2020-08-27 NOTE — Patient Instructions (Signed)
Go to the lab on the way out.   If you have mychart we'll likely use that to update you.    Take care.  Glad to see you. We'll be in touch.  You may need a follow up CT scan.

## 2020-08-28 LAB — PTH, INTACT AND CALCIUM
Calcium: 11.1 mg/dL — ABNORMAL HIGH (ref 8.6–10.4)
PTH: 82 pg/mL — ABNORMAL HIGH (ref 16–77)

## 2020-08-29 ENCOUNTER — Other Ambulatory Visit: Payer: Self-pay | Admitting: Family Medicine

## 2020-08-29 DIAGNOSIS — R413 Other amnesia: Secondary | ICD-10-CM

## 2020-08-29 NOTE — Assessment & Plan Note (Signed)
Discussed looking for reversible causes versus primary dementia. Will get labs done today then possibly ct head if needed.   MMSE 27/30, -1 for orientation, -2 recall. She has support at home in the meantime from her daughter.

## 2020-08-29 NOTE — Assessment & Plan Note (Signed)
Her age and allergy list makes treatment challenging.  We will try Tylenol in the meantime.

## 2020-08-29 NOTE — Assessment & Plan Note (Addendum)
  She thought she was on fosamax in the distant past. I looked back through her old records but I did not see listing of Fosamax use.  Recheck labs today and we will go from there.

## 2020-08-29 NOTE — Assessment & Plan Note (Signed)
See notes on labs. 

## 2020-08-30 ENCOUNTER — Telehealth: Payer: Self-pay | Admitting: Family Medicine

## 2020-08-30 NOTE — Telephone Encounter (Signed)
Pt daughter called returning a call about lab results

## 2020-08-31 NOTE — Telephone Encounter (Signed)
Pt daughter called to follow up on lab results

## 2020-08-31 NOTE — Addendum Note (Signed)
Addended by: Kris Mouton on: 08/31/2020 03:31 PM   Modules accepted: Orders

## 2020-08-31 NOTE — Telephone Encounter (Signed)
Spoke with [name], advised of lab results, see lab notes.

## 2020-09-13 DIAGNOSIS — H52203 Unspecified astigmatism, bilateral: Secondary | ICD-10-CM | POA: Diagnosis not present

## 2020-09-13 DIAGNOSIS — Z961 Presence of intraocular lens: Secondary | ICD-10-CM | POA: Diagnosis not present

## 2020-09-14 ENCOUNTER — Ambulatory Visit: Payer: Medicare Other | Admitting: Family Medicine

## 2020-09-16 ENCOUNTER — Ambulatory Visit
Admission: RE | Admit: 2020-09-16 | Discharge: 2020-09-16 | Disposition: A | Discharge status: Home / Self Care | Payer: Medicare Other | Source: Ambulatory Visit | Attending: Family Medicine | Admitting: Family Medicine

## 2020-09-16 ENCOUNTER — Other Ambulatory Visit: Payer: Self-pay

## 2020-09-16 DIAGNOSIS — R413 Other amnesia: Secondary | ICD-10-CM

## 2020-09-17 ENCOUNTER — Ambulatory Visit (INDEPENDENT_AMBULATORY_CARE_PROVIDER_SITE_OTHER): Payer: Medicare Other | Admitting: Family Medicine

## 2020-09-17 ENCOUNTER — Encounter: Payer: Self-pay | Admitting: Family Medicine

## 2020-09-17 VITALS — BP 128/72 | HR 76 | Temp 97.4°F | Ht 60.0 in | Wt 134.0 lb

## 2020-09-17 DIAGNOSIS — R413 Other amnesia: Secondary | ICD-10-CM

## 2020-09-17 DIAGNOSIS — E213 Hyperparathyroidism, unspecified: Secondary | ICD-10-CM | POA: Diagnosis not present

## 2020-09-17 DIAGNOSIS — M79672 Pain in left foot: Secondary | ICD-10-CM | POA: Diagnosis not present

## 2020-09-17 DIAGNOSIS — Z23 Encounter for immunization: Secondary | ICD-10-CM

## 2020-09-17 MED ORDER — DONEPEZIL HCL 10 MG PO TABS: 10.0000 mg | ORAL_TABLET | Freq: Every day | ORAL | Status: DC

## 2020-09-17 NOTE — Patient Instructions (Addendum)
Consider treatment with donepezil. I think it makes sense to see Dr. Loanne Drilling prior to making any med changes.    Either way, we should keep an eye on your memory and recheck you in a few months.   Take care.  Glad to see you.

## 2020-09-17 NOTE — Progress Notes (Signed)
This visit occurred during the SARS-CoV-2 public health emergency.  Safety protocols were in place, including screening questions prior to the visit, additional usage of staff PPE, and extensive cleaning of exam room while observing appropriate contact time as indicated for disinfecting solutions.  Here today with daughter.   Recent CT d/w pt.  No acute changes but atrophic changes noted.  Memory changes d/w pt.  Patient and family have noted worsening short term memory changes.  She doesn't recall asking mult times about the time for today's appointment.    L foot swelling noted about 10 days ago.  Better in the meantime.  Was puffy at L distal MTs, better now.  No pain now.  Prev with discomfort.  Had iced and elevated her foot.    Calcium/PTH f/u pending Dr. Loanne Drilling and pt/family need clarity from him about management going forward.    Meds, vitals, and allergies reviewed.   ROS: Per HPI unless specifically indicated in ROS section   Nad Ncat She repeats herself during conversation but is pleasant in conversation.   Neck supple, no LA Rrr Ctab Abd soft, not tt Ankles w/o edema B.  Left foot with normal inspection and normal dorsalis pedis pulse but she has some tenderness on testing of the extensor tendons on the L toes.  Metatarsal is not tender to palpation on the plantar side of the foot.  No bruising.

## 2020-09-19 DIAGNOSIS — M79672 Pain in left foot: Secondary | ICD-10-CM | POA: Insufficient documentation

## 2020-09-19 NOTE — Assessment & Plan Note (Signed)
We talked about potential treatment with donepezil.  We talked about routine cautions and potential side effects with nightmares and GI upset.  We talked about monitoring her memory with or without donepezil use.  I think it makes sense to get through her endocrinology appointment and go from there.  No med changes in the meantime.  Patient and family agree.

## 2020-09-19 NOTE — Assessment & Plan Note (Signed)
I need input from endocrinology about options going forward as she is 87 years old with multiple medical problems.  See above.

## 2020-09-19 NOTE — Assessment & Plan Note (Signed)
It looks like she has a resolving tendinitis and it does not appear that she needs imaging at this point.  They can update me as needed.  30 minutes were devoted to patient care in this encounter (this includes time spent reviewing the patient's file/history, interviewing and examining the patient, counseling/reviewing plan with patient).

## 2020-10-04 ENCOUNTER — Other Ambulatory Visit: Payer: Self-pay | Admitting: Family Medicine

## 2020-10-07 ENCOUNTER — Ambulatory Visit: Payer: Medicare Other | Admitting: Endocrinology

## 2020-10-08 DIAGNOSIS — Z85828 Personal history of other malignant neoplasm of skin: Secondary | ICD-10-CM | POA: Diagnosis not present

## 2020-10-08 DIAGNOSIS — L218 Other seborrheic dermatitis: Secondary | ICD-10-CM | POA: Diagnosis not present

## 2020-10-12 ENCOUNTER — Ambulatory Visit (INDEPENDENT_AMBULATORY_CARE_PROVIDER_SITE_OTHER): Payer: Medicare Other | Admitting: Endocrinology

## 2020-10-12 ENCOUNTER — Other Ambulatory Visit: Payer: Self-pay

## 2020-10-12 VITALS — BP 140/80 | HR 78 | Ht 60.0 in | Wt 134.8 lb

## 2020-10-12 DIAGNOSIS — E213 Hyperparathyroidism, unspecified: Secondary | ICD-10-CM

## 2020-10-12 MED ORDER — IBANDRONATE SODIUM 150 MG PO TABS: 150.0000 mg | ORAL_TABLET | ORAL | 11 refills | Status: DC

## 2020-10-12 NOTE — Patient Instructions (Addendum)
I have sent a prescription to your pharmacy, for a pill for the osteoporosis.  Please follow the label instructions.  Please come back for a follow-up appointment in 6 months.

## 2020-10-12 NOTE — Progress Notes (Signed)
Subjective:    Patient ID: [name], female    DOB: 11/25/1932, 87 y.o.   MRN: [id number]  HPI Pt returns for f/u of hypercalcemia, with slightly elev PTH (dx'ed 2016; she has never had urolithiasis or bony fracture; she stopped Vit-D, due to high level; she was dx'ed with osteoporosis in 2022).  She has not recently taken Vit-D.   Past Medical History:  Diagnosis Date   Anxiety    AR (allergic rhinitis)    Atrial fibrillation (HCC)    Basal cell carcinoma of ala nasi 2014   Benign neoplasm stomach body    Cataract    corrected with surgery   Colitis    CTS (carpal tunnel syndrome)    Diverticulitis    Diverticulosis    Esophageal stricture    Frequent headaches    likely migraine   GERD (gastroesophageal reflux disease)    Hepatic steatosis    Hyperlipidemia    Hypertension    IBS (irritable bowel syndrome)    Insomnia    Internal hemorrhoids    Osteopenia     Past Surgical History:  Procedure Laterality Date   CATARACT EXTRACTION, BILATERAL     August 2018, October 2018   CHOLECYSTECTOMY     MOHS SURGERY  2014   nose    Social History   Socioeconomic History   Marital status: Married    Spouse name: Not on file   Number of children: Not on file   Years of education: Not on file   Highest education level: Not on file  Occupational History   Not on file  Tobacco Use   Smoking status: Never   Smokeless tobacco: Never  Vaping Use   Vaping Use: Never used  Substance and Sexual Activity   Alcohol use: No   Drug use: No   Sexual activity: Not Currently    Partners: Male  Other Topics Concern   Not on file  Social History Narrative   Married 1957   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 100 tablet 1   dicyclomine (BENTYL) 10 MG capsule TAKE 1 CAPSULE BY MOUTH UP TO 4 TIMES A DAY AS NEEDED FOR ABDOMINAL CRAMPS 100 capsule 1   ELIQUIS 2.5 MG TABS tablet TAKE 1 TABLET BY MOUTH TWICE A DAY 60 tablet 5   ezetimibe (ZETIA) 10 MG tablet TAKE 1 TABLET BY MOUTH DAILY 90 tablet 1   meclizine (ANTIVERT) 25 MG tablet Take 25 mg by mouth 3 (three) times daily as needed. For vertigo     melatonin 5 MG TABS Take 5 mg by mouth.     Multiple Vitamin (MULTIVITAMIN) tablet Take 1 tablet by mouth daily.     omeprazole (PRILOSEC) 20 MG capsule Take 1 capsule (20 mg total) by mouth daily.     Probiotic Product (SOLUBLE FIBER/PROBIOTICS PO) Take by mouth.     rosuvastatin (CRESTOR) 5 MG tablet TAKE 1/2 TABLET BY MOUTH 5  DAYS A WEEK 30 tablet 1   No current facility-administered medications on file prior to visit.    Allergies  Allergen Reactions   Aspirin    Ciprofloxacin    Nexium [Esomeprazole Magnesium]    Niacin And Related    Propylene Glycol    Vagifem [Estradiol]    Allegra [Fexofenadine]    Bacitracin-Polymyxin B    Ceclor [Cefaclor]    Celebrex [Celecoxib]    Corticosteroids    Doxepin Hcl    Erythromycin    Flagyl [Metronidazole]    Hydrocortisone Other (See Comments)    After use of 1% HC cream, pt reported burning and feeling "scalded" in vaginal area.   Levofloxacin    Levsin [Hyoscyamine Sulfate]    Lidocaine Other (See Comments)    Unknown reaction   Macrobid [Nitrofurantoin Macrocrystal]    Marcaine [Bupivacaine Hcl]    Nsaids    Penicillins    Prevacid [Lansoprazole]     Able to tolerate omeprazole.    Pseudoephedrine    Sulfonamide Derivatives    Tavist [Clemastine]    Cortisone Rash    Any hydrocortisone cream component with severe vaginal  burning    Family History  Problem Relation Age of Onset   Cancer Other    Diabetes Other    CAD Brother    Leukemia Father    Pancreatic cancer Brother    CAD Mother    Breast cancer Sister    Mesothelioma Brother    Colon cancer Neg Hx    Hypercalcemia Neg Hx     BP 140/80 (BP Location: Right Arm, Patient Position: Sitting, Cuff Size: Normal)   Pulse 78   Ht 5' (1.524 m)   Wt 134 lb 12.8 oz (61.1 kg)   LMP 01/17/1988 (Approximate)   SpO2 96%   BMI 26.33 kg/m   Review of Systems     Objective:   Physical Exam GAIT: normal and steady.    Lab Results  Component Value Date   PTH 82 (H) 08/27/2020   CALCIUM 11.0 (H) 08/27/2020   CALCIUM 11.1 (H) 08/27/2020   PHOS 4.0 06/24/2008       Assessment & Plan:  Osteoporosis: We discussed oral vs IV bis-phosphonate.  She chooses PO.  I rx'ed ibandronate.  Hypercalcemia, prob due to hyperparathyroidism: we'll follow.

## 2020-10-21 ENCOUNTER — Telehealth: Payer: Self-pay | Admitting: Family Medicine

## 2020-10-21 NOTE — Telephone Encounter (Signed)
Historical entry.  Patient's husband had an office visit on 10/19/2020.  Natasa Stigall was not present at that point.  We talked about her situation.  Per daughter's report, Biviana Saddler had stated "I do not feel like living anymore".  There is no known suicidal or homicidal intent.  We talked about getting her an office visit to discuss her mood.  Discussed safety and removing firearms from the household to make sure the patient was safe.  Daughter agreed.  Daughter will work on getting the patient set up for visit.  Routine cautions given.

## 2020-10-28 ENCOUNTER — Encounter: Payer: Self-pay | Admitting: Family Medicine

## 2020-10-28 ENCOUNTER — Ambulatory Visit (INDEPENDENT_AMBULATORY_CARE_PROVIDER_SITE_OTHER): Payer: Medicare Other | Admitting: Family Medicine

## 2020-10-28 ENCOUNTER — Other Ambulatory Visit: Payer: Self-pay

## 2020-10-28 DIAGNOSIS — R413 Other amnesia: Secondary | ICD-10-CM | POA: Diagnosis not present

## 2020-10-28 DIAGNOSIS — F419 Anxiety disorder, unspecified: Secondary | ICD-10-CM

## 2020-10-28 DIAGNOSIS — Z66 Do not resuscitate: Secondary | ICD-10-CM | POA: Diagnosis not present

## 2020-10-28 DIAGNOSIS — R42 Dizziness and giddiness: Secondary | ICD-10-CM

## 2020-10-28 DIAGNOSIS — E213 Hyperparathyroidism, unspecified: Secondary | ICD-10-CM

## 2020-10-28 NOTE — Progress Notes (Signed)
This visit occurred during the SARS-CoV-2 public health emergency.  Safety protocols were in place, including screening questions prior to the visit, additional usage of staff PPE, and extensive cleaning of exam room while observing appropriate contact time as indicated for disinfecting solutions.  Mood changes.  She is worried about her husband.  She has been talking with about her daughter about her situation, mentioning that she doesn't want to go on living. No SI/HI.  She is not enthused about starting a med for mood.  Family has checked into [facility] and patient is going to see that facility next week.  That may help a lot with her caregiving strain.   Memory changes.  "It's not getting better."  No acute changes.  She is requiring additional help at home, especially from her daughter.  Osteoporosis.  Boniva rx'd by Dr. Loanne Drilling with endo.  She noted dizziness, meaning a "swimmy headed feeling" that predates boniva use but seemed to be worse afterward.  D/w pt.  She didn't have belly pain after boniva use.  Meclizine helped some of the time but not everyday.  Some days are worse than others.  D/w pt about maintaining enough fluid intake.    DNR reaffirmed and form done at [facility].   Meds, vitals, and allergies reviewed.   ROS: Per HPI unless specifically indicated in ROS section   Nad Ncat Neck supple, no LA Not lightheaded on standing.  Mild sx noted with head rotation.  No bruit Rrr Ctab Abd soft, not ttp Skin well perfused. TM wnl B CN 2-12 wnl B, S/S wnl x4  40 minutes were devoted to patient care in this encounter (this includes time spent reviewing the patient's file/history, interviewing and examining the patient, counseling/reviewing plan with patient).

## 2020-10-28 NOTE — Patient Instructions (Signed)
Add more water daily and see if you notice a change in vertigo in the next few days.  Use meclizine if needed.  See if the vertigo is getting better prior to the next dose on boniva.   Let me know what you think about [facility] next week.  Take care.  Glad to see you.

## 2020-10-31 DIAGNOSIS — R42 Dizziness and giddiness: Secondary | ICD-10-CM | POA: Insufficient documentation

## 2020-10-31 NOTE — Assessment & Plan Note (Signed)
She will see if any vertigo symptoms get better prior to her next dose of Boniva, though her symptoms do predate Boniva use.

## 2020-10-31 NOTE — Assessment & Plan Note (Signed)
And likely depression related to caregiver strain.  Discussed options. I asked her to let me know what she thinks about [facility] next week.  Making a change like moving to [facility] would likely be beneficial for her social life and also to delegate some daily responsibilities.

## 2020-10-31 NOTE — Assessment & Plan Note (Signed)
Reaffirmed DNR status.  Form done.  Given to family/patient.

## 2020-10-31 NOTE — Assessment & Plan Note (Signed)
Her daughter is helping out at home.  We talked about her living situation.  She is going to go investigate [facility] and consider a move there.  We will follow along about her memory changes.

## 2020-10-31 NOTE — Assessment & Plan Note (Signed)
Discussed options.  I asked her to add more water daily and see if she notices a change in vertigo in the next few days.  Use meclizine if needed.  I asked her to see if the vertigo is getting better prior to the next dose on boniva.

## 2020-11-15 ENCOUNTER — Telehealth: Payer: Self-pay | Admitting: Family Medicine

## 2020-11-15 NOTE — Telephone Encounter (Signed)
This issue we discussed was a "swimmy headed feeling" and it was unclear if that got worse after prev ibandronate dose.  Is she having that going on now, prior to the next dose?  How is she doing?

## 2020-11-15 NOTE — Telephone Encounter (Signed)
Pt daughter called stating that Dr Damita Dunnings asked if pt would call to discuss starting back on medication  ibandronate (BONIVA) 150 MG tablet.Pt due to take medication this month. Please advise.      Marland Kitchen

## 2020-11-15 NOTE — Telephone Encounter (Signed)
Pt called in want's to know  should she take RX   ibandronate (BONIVA) 150 MG tablet stated she was told to called in before taking . Please advise

## 2020-11-16 NOTE — Telephone Encounter (Signed)
Patients daughter [name] called back and stated patient was having the dizziness like before but it comes and goes and happens for no reason a lot of the times. Daughter is also worried about her driving now with this going on out of the blue. Other then that she is doing fine. [name] wants to know if her mom needs to take the next dose?

## 2020-11-16 NOTE — Telephone Encounter (Signed)
Tried to call patient but no answer and VM not set up.

## 2020-11-17 NOTE — Telephone Encounter (Signed)
This doesn't sound like it is due to ibandronate, so it should be okay to take the next dose.  I would advise against driving at this point.  And I would have her try to drink more fluid to see if that makes any difference with the dizziness.  If she continues to have true vertigo (ie room spinning), then we could set her up with ENT if needed.  Thanks.

## 2020-11-17 NOTE — Telephone Encounter (Signed)
Called and notified daughter [name] about below. She will have patient take her dose of ibandronate today and will call back if dizziness gets worse for referral to ENT.

## 2020-11-19 DIAGNOSIS — Z23 Encounter for immunization: Secondary | ICD-10-CM | POA: Diagnosis not present

## 2020-11-24 ENCOUNTER — Other Ambulatory Visit: Payer: Self-pay

## 2020-11-24 ENCOUNTER — Telehealth: Payer: Self-pay | Admitting: *Deleted

## 2020-11-24 ENCOUNTER — Ambulatory Visit (INDEPENDENT_AMBULATORY_CARE_PROVIDER_SITE_OTHER): Payer: Medicare Other | Admitting: Family Medicine

## 2020-11-24 ENCOUNTER — Encounter: Payer: Self-pay | Admitting: Family Medicine

## 2020-11-24 VITALS — BP 120/70 | HR 66 | Temp 97.3°F | Ht 60.0 in | Wt 136.1 lb

## 2020-11-24 DIAGNOSIS — Z889 Allergy status to unspecified drugs, medicaments and biological substances status: Secondary | ICD-10-CM | POA: Diagnosis not present

## 2020-11-24 DIAGNOSIS — L239 Allergic contact dermatitis, unspecified cause: Secondary | ICD-10-CM | POA: Diagnosis not present

## 2020-11-24 MED ORDER — TACROLIMUS 0.1 % EX OINT: TOPICAL_OINTMENT | Freq: Two times a day (BID) | CUTANEOUS | 0 refills | Status: DC

## 2020-11-24 NOTE — Telephone Encounter (Signed)
[id number]. TACROLIMUS OIN 0.1% is approved through 01/15/2022.  [facility] notified of approval via fax.

## 2020-11-24 NOTE — Progress Notes (Signed)
[name] Tsang T. Faisal Stradling, MD, [name] at [facility] [place], [address]  Phone: [phone]  FAX: [phone]  [name] - 87 y.o. female  MRN [id number]  Date of Birth: Nov 20, 1932  Date: 11/24/2020  PCP: Tonia Ghent, MD  Referral: Tonia Ghent, MD  Chief Complaint  Patient presents with   Rash    Right Leg    This visit occurred during the SARS-CoV-2 public health emergency.  Safety protocols were in place, including screening questions prior to the visit, additional usage of staff PPE, and extensive cleaning of exam room while observing appropriate contact time as indicated for disinfecting solutions.   Subjective:   [name] is a 87 y.o. very pleasant female patient with Body mass index is 26.59 kg/m. who presents with the following:  November 1st tab of Boniva  She has a new onset rash on her right lower extremity as seen below.  This is highly pruritic, and it does not hurt at all.  She and her daughter cannot think of any possible new exposures at all.  She also has a wealth of different listed allergies including an allergy that includes corticosteroids both orally as well as topically.  She did put a topical steroid on this that she had gotten from her dermatologist a few months ago for a rash on her neck.  Review of Systems is noted in the HPI, as appropriate  Objective:   BP 120/70   Pulse 66   Temp (!) 97.3 F (36.3 C) (Temporal)   Ht 5' (1.524 m)   Wt 136 lb 2 oz (61.7 kg)   LMP 01/17/1988 (Approximate)   SpO2 96%   BMI 26.59 kg/m   GEN: No acute distress; alert,appropriate. PULM: Breathing comfortably in no respiratory distress PSYCH: Normally interactive.     It is not hot.  Laboratory and Imaging Data:  Assessment and Plan:     ICD-10-CM   1. Allergic dermatitis  L23.9     2. Multiple allergies  Z88.9      Highly pruritic rash.  Does not have an  appearance of infection, bacterial or fungal.  Is nontender.  This is likely in some way shape or form an allergic dermatitis, possible contact dermatitis or other.  Exact etiology is not clear, and I am doubtful that a clear etiology could be determined.  Nevertheless, does appear to be dermatitis and I am going to give the patient some Protopic, given her many other allergies.  Additionally use Eucerin, Vaseline, or other skin condition with a sock to aid with absorption.  Meds ordered this encounter  Medications   tacrolimus (PROTOPIC) 0.1 % ointment    Sig: Apply topically 2 (two) times daily.    Dispense:  100 g    Refill:  0   There are no discontinued medications. No orders of the defined types were placed in this encounter.   Follow-up: No follow-ups on file.  [employer] One speech-to-text software was used for transcription in this dictation.  Possible transcriptional errors can occur using Editor, commissioning.   Signed,  Maud Deed. Adara Kittle, MD   Outpatient Encounter Medications as of 11/24/2020  Medication Sig   BAC 50-325-40 MG tablet TAKE 1 TABLET BY MOUTH 2 TIMES DAILY AS NEEDED.   dicyclomine (BENTYL) 10 MG capsule TAKE 1 CAPSULE BY MOUTH UP TO 4 TIMES A DAY AS NEEDED FOR ABDOMINAL CRAMPS   ELIQUIS 2.5 MG  TABS tablet TAKE 1 TABLET BY MOUTH TWICE A DAY   ezetimibe (ZETIA) 10 MG tablet TAKE 1 TABLET BY MOUTH DAILY   ibandronate (BONIVA) 150 MG tablet Take 1 tablet (150 mg total) by mouth every 30 (thirty) days. Take in the morning with a full glass of water, on an empty stomach, and do not take anything else by mouth or lie down for the next 30 min.   meclizine (ANTIVERT) 25 MG tablet Take 25 mg by mouth 3 (three) times daily as needed. For vertigo   melatonin 5 MG TABS Take 5 mg by mouth.   Multiple Vitamin (MULTIVITAMIN) tablet Take 1 tablet by mouth daily.   omeprazole (PRILOSEC) 20 MG capsule Take 1 capsule (20 mg total) by mouth daily.   Probiotic Product  (SOLUBLE FIBER/PROBIOTICS PO) Take by mouth.   rosuvastatin (CRESTOR) 5 MG tablet TAKE 1/2 TABLET BY MOUTH 5 DAYS A WEEK   tacrolimus (PROTOPIC) 0.1 % ointment Apply topically 2 (two) times daily.   No facility-administered encounter medications on file as of 11/24/2020.

## 2020-11-24 NOTE — Telephone Encounter (Signed)
Received email requesting PA for tacrolimus 1% ointment.  PA completed on CoverMyMeds and sent to [facility] for review.  Can take up to 72 hours for a decision.

## 2020-11-25 DIAGNOSIS — L309 Dermatitis, unspecified: Secondary | ICD-10-CM | POA: Diagnosis not present

## 2020-11-25 DIAGNOSIS — Z85828 Personal history of other malignant neoplasm of skin: Secondary | ICD-10-CM | POA: Diagnosis not present

## 2020-11-28 ENCOUNTER — Encounter: Payer: Self-pay | Admitting: Family Medicine

## 2020-11-28 ENCOUNTER — Other Ambulatory Visit: Payer: Self-pay

## 2020-11-28 ENCOUNTER — Encounter (HOSPITAL_COMMUNITY): Payer: Self-pay

## 2020-11-28 ENCOUNTER — Emergency Department (HOSPITAL_COMMUNITY)
Admission: EM | Admit: 2020-11-28 | Discharge: 2020-11-28 | Disposition: A | Discharge status: Home / Self Care | Payer: Medicare Other | Attending: Emergency Medicine | Admitting: Emergency Medicine

## 2020-11-28 DIAGNOSIS — I4891 Unspecified atrial fibrillation: Secondary | ICD-10-CM | POA: Insufficient documentation

## 2020-11-28 DIAGNOSIS — I10 Essential (primary) hypertension: Secondary | ICD-10-CM | POA: Diagnosis not present

## 2020-11-28 DIAGNOSIS — Z85828 Personal history of other malignant neoplasm of skin: Secondary | ICD-10-CM | POA: Diagnosis not present

## 2020-11-28 DIAGNOSIS — Z79899 Other long term (current) drug therapy: Secondary | ICD-10-CM | POA: Insufficient documentation

## 2020-11-28 DIAGNOSIS — Z7901 Long term (current) use of anticoagulants: Secondary | ICD-10-CM | POA: Diagnosis not present

## 2020-11-28 DIAGNOSIS — R21 Rash and other nonspecific skin eruption: Secondary | ICD-10-CM | POA: Insufficient documentation

## 2020-11-28 MED ORDER — LORAZEPAM 0.5 MG PO TABS
0.5000 mg | ORAL_TABLET | Freq: Once | ORAL | Status: AC
  Administered 2020-11-28: 0.5 mg via ORAL
  Filled 2020-11-28: qty 1

## 2020-11-28 MED ORDER — SODIUM CHLORIDE 0.9 % IV SOLN
25.0000 mg | Freq: Once | INTRAVENOUS | Status: AC
  Administered 2020-11-28: 25 mg via INTRAVENOUS
  Filled 2020-11-28: qty 25

## 2020-11-28 MED ORDER — HYDROXYZINE HCL 25 MG PO TABS
25.0000 mg | ORAL_TABLET | Freq: Once | ORAL | Status: AC
  Administered 2020-11-28: 25 mg via ORAL
  Filled 2020-11-28: qty 1

## 2020-11-28 MED ORDER — HYDROXYZINE HCL 25 MG PO TABS: 25.0000 mg | ORAL_TABLET | Freq: Three times a day (TID) | ORAL | 0 refills | Status: DC | PRN

## 2020-11-28 MED ORDER — FAMOTIDINE IN NACL 20-0.9 MG/50ML-% IV SOLN
20.0000 mg | Freq: Once | INTRAVENOUS | Status: AC
  Administered 2020-11-28: 20 mg via INTRAVENOUS
  Filled 2020-11-28: qty 50

## 2020-11-28 NOTE — ED Provider Notes (Signed)
[facility] DEPT Provider Note   CSN: [id number] Arrival date & time: 11/28/20  0546     History Chief Complaint  Patient presents with   Allergic Reaction    [name] is a 87 y.o. female presents with a chief complaint of rash.  Patient states that she developed rash to right lower leg 1 week prior.  Patient saw her PCP on Wednesday and dermatologist on Thursday for this rash.  Was prescribed prednisone, Zyrtec, and Benadryl by dermatologist.  Patient took 1 dose of prednisone on Thursday.  Patient states that she developed erythematous macular rash throughout her entire body starting yesterday evening.  Patient describes rash as pruritic.  Patient took Benadryl last night with no improvement in symptoms and oatmeal bath this morning with no improvement of symptoms.  Patient denies any rash to palms, soles of feet, or oral mucosa.  Denies any fevers, chills, neck pain, neck stiffness, trouble swallowing, trouble breathing, abdominal pain, nausea, vomiting, diarrhea.  Patient endorses extensive allergy list.  No known exposure to allergens.  Only new medication was Jaclyn Prime which she took 10/1 and 11/2.  Per chart review patient has allergy to corticosteroids.  Patient states that in the past when she has had this medication she breaks out in a rash all over.   Allergic Reaction Presenting symptoms: rash   Presenting symptoms: no difficulty swallowing       Past Medical History:  Diagnosis Date   Anxiety    AR (allergic rhinitis)    Atrial fibrillation (HCC)    Basal cell carcinoma of [place] 2014   Benign neoplasm stomach body    Cataract    corrected with surgery   Colitis    CTS (carpal tunnel syndrome)    Diverticulitis    Diverticulosis    Esophageal stricture    Frequent headaches    likely migraine   GERD (gastroesophageal reflux disease)    Hepatic steatosis    Hyperlipidemia    Hypertension    IBS (irritable bowel syndrome)     Insomnia    Internal hemorrhoids    Osteopenia     Patient Active Problem List   Diagnosis Date Noted   Vertigo 10/31/2020   Left foot pain 09/19/2020   Memory loss 08/29/2020   Hyperparathyroidism ([place]) 09/05/2019   DNR (do not resuscitate) 07/21/2019   Positive fecal occult blood test 08/02/2018   Chronic anticoagulation 08/02/2018   Abdominal pain 04/23/2018   Lower back pain 02/03/2018   Atrial fibrillation ([place]) 09/17/2017   Tachycardia 08/30/2017   Right foot pain 06/17/2017   Health care maintenance 06/06/2017   Advance care planning 06/06/2017   Anxiety 06/06/2017   Medicare annual wellness visit, subsequent 05/29/2017   Osteoporosis 10/08/2016   Insomnia    Frequent headaches    IBS 12/31/2007   CARCINOMA, BASAL CELL, SKIN 02/22/2007   HLD (hyperlipidemia) 02/22/2007   GASTROESOPHAGEAL REFLUX DISEASE 02/22/2007   CHOLECYSTECTOMY, HX OF 02/22/2007    Past Surgical History:  Procedure Laterality Date   CATARACT EXTRACTION, BILATERAL     August 2018, October 2018   CHOLECYSTECTOMY     MOHS SURGERY  2014   nose     OB History     Gravida  2   Para  2   Term  0   Preterm  0   AB  0   Living  2      SAB  0   IAB  0  Ectopic  0   Multiple  0   Live Births  2           Family History  Problem Relation Age of Onset   Cancer Other    Diabetes Other    CAD Brother    Leukemia Father    Pancreatic cancer Brother    CAD Mother    Breast cancer Sister    Mesothelioma Brother    Colon cancer Neg Hx    Hypercalcemia Neg Hx     Social History   Tobacco Use   Smoking status: Never   Smokeless tobacco: Never  Vaping Use   Vaping Use: Never used  Substance Use Topics   Alcohol use: No   Drug use: No    Home Medications Prior to Admission medications   Medication Sig Start Date End Date Taking? Authorizing Provider  BAC 50-325-40 MG tablet TAKE 1 TABLET BY MOUTH 2 TIMES DAILY AS NEEDED. 05/12/20   Tonia Ghent, MD   dicyclomine (BENTYL) 10 MG capsule TAKE 1 CAPSULE BY MOUTH UP TO 4 TIMES A DAY AS NEEDED FOR ABDOMINAL CRAMPS 03/12/20   Tonia Ghent, MD  ELIQUIS 2.5 MG TABS tablet TAKE 1 TABLET BY MOUTH TWICE A DAY 10/05/20   Tonia Ghent, MD  ezetimibe (ZETIA) 10 MG tablet TAKE 1 TABLET BY MOUTH DAILY 08/24/20   Tonia Ghent, MD  ibandronate (BONIVA) 150 MG tablet Take 1 tablet (150 mg total) by mouth every 30 (thirty) days. Take in the morning with a full glass of water, on an empty stomach, and do not take anything else by mouth or lie down for the next 30 min. 10/12/20   Renato Shin, MD  meclizine (ANTIVERT) 25 MG tablet Take 25 mg by mouth 3 (three) times daily as needed. For vertigo    [provider]  melatonin 5 MG TABS Take 5 mg by mouth.    [provider]  Multiple Vitamin (MULTIVITAMIN) tablet Take 1 tablet by mouth daily.    [provider]  omeprazole (PRILOSEC) 20 MG capsule Take 1 capsule (20 mg total) by mouth daily. 07/18/19   Tonia Ghent, MD  Probiotic Product (SOLUBLE FIBER/PROBIOTICS PO) Take by mouth.    [provider]  rosuvastatin (CRESTOR) 5 MG tablet TAKE 1/2 TABLET BY MOUTH 5 DAYS A WEEK 10/05/20   Tonia Ghent, MD  tacrolimus (PROTOPIC) 0.1 % ointment Apply topically 2 (two) times daily. 11/24/20   Copland, Frederico Hamman, MD    Allergies    Aspirin, Ciprofloxacin, Nexium [esomeprazole magnesium], Niacin and related, Propylene glycol, Vagifem [estradiol], Allegra [fexofenadine], Bacitracin-polymyxin b, Ceclor [cefaclor], Celebrex [celecoxib], Corticosteroids, Doxepin hcl, Erythromycin, Flagyl [metronidazole], Hydrocortisone, Levofloxacin, Levsin [hyoscyamine sulfate], Lidocaine, Macrobid [nitrofurantoin macrocrystal], Marcaine [bupivacaine hcl], Nsaids, Penicillins, Prevacid [lansoprazole], Pseudoephedrine, Sulfonamide derivatives, Tavist [clemastine], and Cortisone  Review of Systems   Review of Systems  Constitutional:  Negative for  chills and fever.  HENT:  Negative for drooling, facial swelling and trouble swallowing.   Eyes:  Negative for visual disturbance.  Respiratory:  Negative for shortness of breath.   Cardiovascular:  Negative for chest pain.  Gastrointestinal:  Negative for abdominal pain, diarrhea, nausea and vomiting.  Musculoskeletal:  Negative for back pain and neck pain.  Skin:  Positive for rash. Negative for color change.  Neurological:  Negative for dizziness, syncope, light-headedness and headaches.  Psychiatric/Behavioral:  Negative for confusion.    Physical Exam Updated Vital Signs BP (!) 128/114 (BP Location: Left  Arm)   Pulse 72   Resp 16   LMP 01/17/1988 (Approximate)   SpO2 99%   Physical Exam Vitals and nursing note reviewed.  Constitutional:      General: She is not in acute distress.    Appearance: She is not ill-appearing, toxic-appearing or diaphoretic.  HENT:     Head: Normocephalic. No right periorbital erythema or left periorbital erythema.     Jaw: No trismus, tenderness, swelling, pain on movement or malocclusion.     Mouth/Throat:     Lips: Pink. No lesions.     Mouth: Mucous membranes are moist. No injury, lacerations, oral lesions or angioedema.     Tongue: No lesions. Tongue does not deviate from midline.     Palate: No mass and lesions.     Pharynx: Oropharynx is clear. Uvula midline. No pharyngeal swelling, oropharyngeal exudate, posterior oropharyngeal erythema or uvula swelling.     Tonsils: No tonsillar exudate or tonsillar abscesses.  Eyes:     General: No scleral icterus.       Right eye: No discharge.        Left eye: No discharge.  Cardiovascular:     Rate and Rhythm: Normal rate.  Pulmonary:     Effort: Pulmonary effort is normal. No tachypnea, bradypnea or respiratory distress.     Breath sounds: Normal breath sounds. No stridor.  Abdominal:     General: Abdomen is flat. There is no distension. There are no signs of injury.     Palpations: Abdomen  is soft. There is no mass or pulsatile mass.     Tenderness: There is no abdominal tenderness. There is no guarding or rebound.  Musculoskeletal:     Cervical back: Normal range of motion and neck supple. No edema, erythema, signs of trauma, rigidity, torticollis or crepitus. No pain with movement, spinous process tenderness or muscular tenderness. Normal range of motion.  Lymphadenopathy:     Cervical: No cervical adenopathy.  Skin:    General: Skin is warm and dry.     Findings: Rash present. No petechiae. Rash is macular. Rash is not crusting, nodular, papular, purpuric, pustular, scaling, urticarial or vesicular.     Comments: Patient has erythematous macular rash to bilateral upper and lower extremities, trunk and back.  No rash noted to palms, soles, or oral mucosa.  Neurological:     General: No focal deficit present.     Mental Status: She is alert.  Psychiatric:        Behavior: Behavior is cooperative.    ED Results / Procedures / Treatments   Labs (all labs ordered are listed, but only abnormal results are displayed) Labs Reviewed - No data to display  EKG None  Radiology No results found.  Procedures Procedures   Medications Ordered in ED Medications - No data to display  ED Course  I have reviewed the triage vital signs and the nursing notes.  Pertinent labs & imaging results that were available during my care of the patient were reviewed by me and considered in my medical decision making (see chart for details).    MDM Rules/Calculators/A&P                           Alert 87 year old female no acute stress, nontoxic-appearing.  Presents to ED with chief complaint of rash all over her body.  Patient started with rash to right lower extremity.  Was started on prednisone, Benadryl, and Zyrtec.  Rash spread throughout her entire body yesterday evening.  Patient has listed allergy to corticosteroids, took prednisone on Thursday.  States in the past she has broke  out in rash all over after taking corticosteroids.  Low suspicion for anaphylactic reaction as patient has no angioedema, facial swelling, trouble swallowing, trouble breathing, nausea, vomiting, or diarrhea.  Low suspicion for SJS as no rash to oral mucosa, palms, or soles of feet.  Suspect possible drug eruption rash from prednisone.  We will give patient Atarax for pruritus and reassess.  Patient has continued pruritus after receiving Atarax.  We will give patient Pepcid and IV Benadryl.  Patient reports some improvement in.  However still present.  We will give patient oral Ativan.  Patient reports improvement in pruritus after receiving this medication.  On serial reexamination patient has no angioedema, trouble swallowing, or trouble breathing.  No change in rash.  We will plan to discharge patient with short course of Atarax medication.  Patient has follow-up with PCP on Tuesday.  Advised patient and patient's daughter at bedside to call dermatologist tomorrow to also schedule follow-up appointment in the next 1 to 2 days.  Strict return precautions were discussed with patient and patient's daughter at bedside.  Patient was discussed with and evaluated by Dr. Maryan Rued.  Discussed results, findings, treatment and follow up. Patient advised of return precautions. Patient verbalized understanding and agreed with plan.      Final Clinical Impression(s) / ED Diagnoses Final diagnoses:  Rash    Rx / DC Orders ED Discharge Orders          Ordered    hydrOXYzine (ATARAX/VISTARIL) 25 MG tablet  Every 8 hours PRN        11/28/20 [address] 11/28/20 1045    Blanchie Dessert, MD 11/28/20 1049

## 2020-11-28 NOTE — Discharge Instructions (Addendum)
You came to the emergency department today to be evaluated for your rash.  Your rash may be due to an allergic reaction.  Please stop taking the prednisone you are prescribed.  You may continue to take the Zyrtec and Benadryl.  I have given you a prescription of Atarax to help with your itching.  Please take this medication as prescribed.  Please call your PCP or dermatologist tomorrow morning to schedule a follow-up in the next 1 to 2 days.  Get help right away if you: Have a fever and your symptoms suddenly get worse. Develop confusion. Have a severe headache or a stiff neck. Have severe joint pains or stiffness. Have a seizure. Develop a rash that covers all or most of your body. The rash may or may not be painful. Develop blisters that: Are on top of the rash. Grow larger or grow together. Are painful. Are inside your nose or mouth. Develop a rash that: Looks like purple pinprick-sized spots all over your body. Has a "bull's eye" or looks like a target. Is not related to sun exposure, is red and painful, and causes your skin to peel.

## 2020-11-28 NOTE — ED Triage Notes (Signed)
Pt reports waking up with redness all over her body. She states that this has happened before years ago. Pt reports a rash that started 1 week ago on her right leg and feels that this has stemmed from that situation.

## 2020-11-30 ENCOUNTER — Other Ambulatory Visit: Payer: Self-pay

## 2020-11-30 ENCOUNTER — Encounter: Payer: Self-pay | Admitting: Family Medicine

## 2020-11-30 ENCOUNTER — Ambulatory Visit (INDEPENDENT_AMBULATORY_CARE_PROVIDER_SITE_OTHER): Payer: Medicare Other | Admitting: Family Medicine

## 2020-11-30 DIAGNOSIS — R21 Rash and other nonspecific skin eruption: Secondary | ICD-10-CM

## 2020-11-30 MED ORDER — HYDROXYZINE HCL 25 MG PO TABS: 25.0000 mg | ORAL_TABLET | Freq: Three times a day (TID) | ORAL | 1 refills | Status: DC | PRN

## 2020-11-30 NOTE — Patient Instructions (Addendum)
Keep taking zyrtec.  Use hydroxyzine as needed.  If needed, use benadryl on top of that. Update me as needed.  Should gradually get better.  Take care.  Glad to see you.

## 2020-11-30 NOTE — Progress Notes (Signed)
This visit occurred during the SARS-CoV-2 public health emergency.  Safety protocols were in place, including screening questions prior to the visit, additional usage of staff PPE, and extensive cleaning of exam room while observing appropriate contact time as indicated for disinfecting solutions.  Allergic reaction.  Initially seen on 11/24/20 with R leg rash, rx'd protopic but didn't use.  No use of that med at all- ie Protopic could not be contributing to current symptoms.  The next day, was seen at derm.  Rx'd steroid ointment and prednisone, along with benadryl and zyrtec.  Started prednisone on PM on 11/25/20.  Took prednisone on 11/26/20 and 11/27/20.  Then skin turned red on 11/27/20.  This was a diffuse change witnessed by family.  Had also cleaned the stovetop recently, but had done in the past w/o any issues with that cleaner.  No wheeze or lip/tongue sx.    Still with diffuse changes on the skin and itching, B arms, trunk, B legs.  Skin is pink now, not red.  Still with same initial R lower leg rash.    Has been using sarna lotion in the meantime.  She took an oatmeal bath w/o relief prior, d/w pt about retrial.    Took boniva 11/18/20. She is still taking zyrtec 10mg  a day and 25mg  benadryl prn and hydroxyzine prn.    Meds, vitals, and allergies reviewed.   ROS: Per HPI unless specifically indicated in ROS section   Nad Ncat Neck supple, no LA Rrr Ctab, no wheeze. Diffuse blanching maculopapular rash on trunk and arms and legs w/o ulceration  Not tachycardic.   No stridor. Small <<1cm closed comedone with scant material expressed, on the back of the neck. Tolerated well.  She consented for treatment.

## 2020-12-01 DIAGNOSIS — R21 Rash and other nonspecific skin eruption: Secondary | ICD-10-CM | POA: Insufficient documentation

## 2020-12-01 NOTE — Telephone Encounter (Signed)
See office visit note.  Daughter was able to come to office visit.

## 2020-12-01 NOTE — Assessment & Plan Note (Signed)
I presume that her diffuse rash is a reaction to the steroid treatment.  Would avoid corticosteroids in the meantime.  Allergy list reviewed.  Would continue antihistamines in the meantime.  She has tolerated that so far.  Would keep taking zyrtec.  Use hydroxyzine as needed.  If needed, use benadryl on top of that. Update me as needed.  Her skin changes should gradually get better.  She does not have any lip or airway symptoms.  Okay for outpatient follow-up.  She will update me as needed.

## 2020-12-08 DIAGNOSIS — L0889 Other specified local infections of the skin and subcutaneous tissue: Secondary | ICD-10-CM | POA: Diagnosis not present

## 2020-12-08 DIAGNOSIS — Z85828 Personal history of other malignant neoplasm of skin: Secondary | ICD-10-CM | POA: Diagnosis not present

## 2020-12-30 DIAGNOSIS — L570 Actinic keratosis: Secondary | ICD-10-CM | POA: Diagnosis not present

## 2020-12-30 DIAGNOSIS — D225 Melanocytic nevi of trunk: Secondary | ICD-10-CM | POA: Diagnosis not present

## 2020-12-30 DIAGNOSIS — Z85828 Personal history of other malignant neoplasm of skin: Secondary | ICD-10-CM | POA: Diagnosis not present

## 2020-12-30 DIAGNOSIS — L918 Other hypertrophic disorders of the skin: Secondary | ICD-10-CM | POA: Diagnosis not present

## 2020-12-30 DIAGNOSIS — L84 Corns and callosities: Secondary | ICD-10-CM | POA: Diagnosis not present

## 2020-12-30 DIAGNOSIS — L814 Other melanin hyperpigmentation: Secondary | ICD-10-CM | POA: Diagnosis not present

## 2020-12-30 DIAGNOSIS — L821 Other seborrheic keratosis: Secondary | ICD-10-CM | POA: Diagnosis not present

## 2021-01-31 ENCOUNTER — Encounter: Payer: Self-pay | Admitting: Family Medicine

## 2021-02-14 ENCOUNTER — Encounter: Payer: Self-pay | Admitting: Family Medicine

## 2021-02-15 ENCOUNTER — Encounter: Payer: Self-pay | Admitting: Family Medicine

## 2021-02-15 ENCOUNTER — Ambulatory Visit (INDEPENDENT_AMBULATORY_CARE_PROVIDER_SITE_OTHER): Payer: Medicare Other | Admitting: Family Medicine

## 2021-02-15 ENCOUNTER — Other Ambulatory Visit: Payer: Self-pay

## 2021-02-15 DIAGNOSIS — R413 Other amnesia: Secondary | ICD-10-CM | POA: Diagnosis not present

## 2021-02-15 DIAGNOSIS — M810 Age-related osteoporosis without current pathological fracture: Secondary | ICD-10-CM | POA: Diagnosis not present

## 2021-02-15 NOTE — Patient Instructions (Signed)
I think we should recheck your memory at a visit in about 3 months, sooner if needed.  If you have troubles in the meantime then let me know.  Take care.  Glad to see you.

## 2021-02-15 NOTE — Progress Notes (Signed)
This visit occurred during the SARS-CoV-2 public health emergency.  Safety protocols were in place, including screening questions prior to the visit, additional usage of staff PPE, and extensive cleaning of exam room while observing appropriate contact time as indicated for disinfecting solutions.  Her daughter lives next door and patient's sister has moved in temporarily.  Pt's husband recently died, d/w pt.  Condolences offered.  Patient doesn't want to live alone-that appears to be a clear goal for the patient.  D/w pt and daughter.    She isn't driving.  Memory d/w pt. she wanted to defer aricept at this point, with family support, not driving and having someone at home.    She is off boniva currently and is going to follow up with endocrine in 03/2021.    Still anticoagulated.  No bleeding.  H/o AF d/w pt.  No recent known sx of AF.    Meds, vitals, and allergies reviewed.   ROS: Per HPI unless specifically indicated in ROS section   GEN: nad, alert and oriented except as stated below.  Pleasant conversation.  Tearful when discussing her husband's death but regains composure. HEENT: MMM NECK: supple w/o LA CV: rrr.   PULM: ctab, no inc wob ABD: soft, +bs EXT: no edema SKIN: Well-perfused.  MMSE 26/30, -1 for date, -1 for knowing the floor of the building, -2 for short-term recall.  This is similar to previous (previous 27/30)  32 minutes were devoted to patient care in this encounter (this includes time spent reviewing the patient's file/history, interviewing and examining the patient, counseling/reviewing plan with patient).

## 2021-02-16 NOTE — Assessment & Plan Note (Signed)
I think it is reasonable to follow-up with endocrinology.  I will defer.

## 2021-02-16 NOTE — Assessment & Plan Note (Signed)
°  MMSE 26/30, -1 for date, -1 for knowing the floor of the building, -2 for short-term recall.  This is similar to previous (previous 27/30)  She has support from her daughter and her sister moved in.  I think the death of her husband has been a significant stressor and this likely makes any underlying memory difficulty more obvious.  We talked about options.  She wanted to defer Aricept start at this point.  I think this is reasonable for now and we can recheck in a few months.  If she has any significant changes in the meantime that she can update me.  Patient and daughter agree with plan.  I advised her not to drive.

## 2021-02-23 ENCOUNTER — Ambulatory Visit: Payer: Medicare Other

## 2021-02-26 ENCOUNTER — Other Ambulatory Visit: Payer: Self-pay | Admitting: Family Medicine

## 2021-03-04 NOTE — Progress Notes (Addendum)
Annual [facility] visit completed on 03/08/21 by Charlott Rakes LPN

## 2021-03-08 ENCOUNTER — Ambulatory Visit (INDEPENDENT_AMBULATORY_CARE_PROVIDER_SITE_OTHER): Payer: Medicare Other

## 2021-03-08 ENCOUNTER — Other Ambulatory Visit: Payer: Self-pay

## 2021-03-08 DIAGNOSIS — Z0000 Encounter for general adult medical examination without abnormal findings: Secondary | ICD-10-CM | POA: Diagnosis not present

## 2021-03-08 NOTE — Progress Notes (Addendum)
Subjective:   [name] is a 88 y.o. female who presents for Medicare Annual (Subsequent) preventive examination.  I connected with [name] today by telephone and verified that I am speaking with the correct person using two identifiers. Along with daughter [name]  Location patient: home Location provider: work Persons participating in the virtual visit: patient, Marine scientist.    I discussed the limitations, risks, security and privacy concerns of performing an evaluation and management service by telephone and the availability of in person appointments. I also discussed with the patient that there may be a patient responsible charge related to this service. The patient expressed understanding and verbally consented to this telephonic visit.    Interactive audio and video telecommunications were attempted between this provider and patient, however failed, due to patient having technical difficulties OR patient did not have access to video capability.  We continued and completed visit with audio only.  Some vital signs may be absent or patient reported.   Time Spent with patient on telephone encounter: 35 minutes  Review of Systems     Cardiac Risk Factors include: advanced age (>47men, >67 women);dyslipidemia     Objective:    There were no vitals filed for this visit. There is no height or weight on file to calculate BMI.  Advanced Directives 03/08/2021 11/28/2020 06/13/2018 09/09/2017 05/29/2017 01/06/2012  Does Patient Have a Medical Advance Directive? Yes No No No No Patient would like information;Patient does not have advance directive  Type of Advance Directive [place] in Chart? No - copy requested - - - - -  Would patient like information on creating a medical advance directive? - No - Patient declined No - Patient declined No - Patient declined No - Patient declined Advance directive packet given   Pre-existing out of facility DNR order (yellow form or pink MOST form) - - - - - No    Current Medications (verified) Outpatient Encounter Medications as of 03/08/2021  Medication Sig   BAC 50-325-40 MG tablet TAKE 1 TABLET BY MOUTH 2 TIMES DAILY AS NEEDED.   dicyclomine (BENTYL) 10 MG capsule TAKE 1 CAPSULE BY MOUTH UP TO 4 TIMES A DAY AS NEEDED FOR ABDOMINAL CRAMPS   ELIQUIS 2.5 MG TABS tablet TAKE 1 TABLET BY MOUTH TWICE A DAY   ezetimibe (ZETIA) 10 MG tablet TAKE 1 TABLET BY MOUTH DAILY   meclizine (ANTIVERT) 25 MG tablet Take 25 mg by mouth 3 (three) times daily as needed. For vertigo   melatonin 5 MG TABS Take 5 mg by mouth.   Multiple Vitamin (MULTIVITAMIN) tablet Take 1 tablet by mouth daily.   omeprazole (PRILOSEC) 20 MG capsule Take 1 capsule (20 mg total) by mouth daily. (Patient taking differently: Take 20 mg by mouth daily. As needed)   Probiotic Product (SOLUBLE FIBER/PROBIOTICS PO) Take by mouth.   rosuvastatin (CRESTOR) 5 MG tablet TAKE 1/2 TABLET BY MOUTH 5 DAYS A WEEK   [DISCONTINUED] hydrOXYzine (ATARAX/VISTARIL) 25 MG tablet Take 1 tablet (25 mg total) by mouth every 8 (eight) hours as needed for itching (sedation caution).   No facility-administered encounter medications on file as of 03/08/2021.    Allergies (verified) Aspirin, Ciprofloxacin, Corticosteroids, Nexium [esomeprazole magnesium], Niacin and related, Propylene glycol, Vagifem [estradiol], Allegra [fexofenadine], Bacitracin-polymyxin b, Ceclor [cefaclor], Celebrex [celecoxib], Doxepin hcl, Erythromycin, Flagyl [metronidazole], Hydrocortisone, Levofloxacin, Levsin [hyoscyamine sulfate], Lidocaine, Macrobid [nitrofurantoin macrocrystal], Marcaine [bupivacaine hcl], Nsaids, Penicillins, Prevacid [  lansoprazole], Pseudoephedrine, Sulfonamide derivatives, Tavist [clemastine], and Cortisone   History: Past Medical History:  Diagnosis Date   Anxiety    AR (allergic rhinitis)    Atrial fibrillation (HCC)    Basal  cell carcinoma of [place] 2014   Benign neoplasm stomach body    Cataract    corrected with surgery   Colitis    CTS (carpal tunnel syndrome)    Diverticulitis    Diverticulosis    Esophageal stricture    Frequent headaches    likely migraine   GERD (gastroesophageal reflux disease)    Hepatic steatosis    Hyperlipidemia    Hypertension    IBS (irritable bowel syndrome)    Insomnia    Internal hemorrhoids    Osteopenia    Past Surgical History:  Procedure Laterality Date   CATARACT EXTRACTION, BILATERAL     August 2018, October 2018   CHOLECYSTECTOMY     MOHS SURGERY  2014   nose   Family History  Problem Relation Age of Onset   Cancer Other    Diabetes Other    CAD Brother    Leukemia Father    Pancreatic cancer Brother    CAD Mother    Breast cancer Sister    Mesothelioma Brother    Colon cancer Neg Hx    Hypercalcemia Neg Hx    Social History   Socioeconomic History   Marital status: Married    Spouse name: Not on file   Number of children: Not on file   Years of education: Not on file   Highest education level: Not on file  Occupational History   Not on file  Tobacco Use   Smoking status: Never   Smokeless tobacco: Never  Vaping Use   Vaping Use: Never used  Substance and Sexual Activity   Alcohol use: No   Drug use: No   Sexual activity: Not Currently    Partners: Male  Other Topics Concern   Not on file  Social History Narrative   Widowed 2023- was married 1957   Retired from Product manager products, 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    2 kids   Social Determinants of Radio broadcast assistant Strain: Low Risk    Difficulty of Paying Living Expenses: Not hard at all  Food Insecurity: No Food Insecurity   Worried About Charity fundraiser in the Last Year: Never true   Arboriculturist in the Last Year: Never true  Transportation Needs: No Transportation Needs   Lack of Transportation (Medical): No   Lack of Transportation (Non-Medical):  No  Physical Activity: Inactive   Days of Exercise per Week: 0 days   Minutes of Exercise per Session: 0 min  Stress: No Stress Concern Present   Feeling of Stress : Not at all  Social Connections: Socially Isolated   Frequency of Communication with Friends and Family: More than three times a week   Frequency of Social Gatherings with Friends and Family: More than three times a week   Attends Religious Services: Never   Marine scientist or Organizations: No   Attends Archivist Meetings: Never   Marital Status: Widowed    Tobacco Counseling Counseling given: Not Answered   Clinical Intake:  Pre-visit preparation completed: Yes  Pain : No/denies pain     BMI - recorded: 27.15 Nutritional Status: BMI 25 -29 Overweight Nutritional Risks: None Diabetes: No  How often do you need  to have someone help you when you read instructions, pamphlets, or other written materials from your doctor or pharmacy?: 1 - Never  Diabetic? No  Interpreter Needed?: No  Information entered by :: Charlott Rakes, LPN   Activities of Daily Living In your present state of health, do you have any difficulty performing the following activities: 03/08/2021 08/27/2020  Hearing? Tempie Donning  Comment [place]? N N  Difficulty concentrating or making decisions? N Y  Walking or climbing stairs? N N  Dressing or bathing? N N  Doing errands, shopping? N Y  Conservation officer, nature and eating ? N -  Using the Toilet? N -  In the past six months, have you accidently leaked urine? Y -  Comment wears a pantyliners at times -  Do you have problems with loss of bowel control? N -  Managing your Medications? N -  Managing your Finances? N -  Housekeeping or managing your Housekeeping? N -  Some recent data might be hidden    Patient Care Team: Tonia Ghent, MD as PCP - General (Family Medicine) Buford Dresser, MD as PCP - Cardiology (Cardiology) Jacolyn Reedy, MD as Consulting  Physician (Cardiology) Yisroel Ramming, Everardo All, MD as Consulting Physician (Obstetrics and Gynecology)  Indicate any recent Medical Services you may have received from other than [facility] providers in the past year (date may be approximate).     Assessment:   This is a routine wellness examination for Mio.  Hearing/Vision screen Hearing Screening - Comments:: Pt has slight hearing loss  Vision Screening - Comments:: Pt follows up With Dr Eben Burow for annual eye exams   Dietary issues and exercise activities discussed: Current Exercise Habits: The patient does not participate in regular exercise at present   Goals Addressed   None   Depression Screen PHQ 2/9 Scores 03/08/2021 01/02/2020 06/13/2018 05/29/2017  PHQ - 2 Score 1 0 0 0  PHQ- 9 Score 2 - 0 0    Fall Risk Fall Risk  03/08/2021 02/15/2021 01/02/2020 06/13/2018 05/29/2017  Falls in the past year? 0 0 0 0 No  Number falls in past yr: 0 0 0 - -  Injury with Fall? 0 0 0 - -  Risk for fall due to : Impaired vision;Impaired balance/gait No Fall Risks - - -  Follow up Falls prevention discussed Falls evaluation completed Falls evaluation completed - -    FALL RISK PREVENTION PERTAINING TO THE HOME:  Any stairs in or around the home? Yes  If so, are there any without handrails? No  Home free of loose throw rugs in walkways, pet beds, electrical cords, etc? Yes  Adequate lighting in your home to reduce risk of falls? Yes   ASSISTIVE DEVICES UTILIZED TO PREVENT FALLS:  Life alert? No  Use of a cane, walker or w/c? No  Grab bars in the bathroom? Yes  Shower chair or bench in shower? Yes  Elevated toilet seat or a handicapped toilet? No   TIMED UP AND GO:  Was the test performed? No .   Cognitive Function: MMSE - Mini Mental State Exam 06/13/2018 05/29/2017  Orientation to time 5 5  Orientation to Place 5 5  Registration 3 3  Attention/ Calculation 0 0  Recall 3 3  Language- name 2 objects 0 0  Language- repeat 1 1   Language- follow 3 step command 0 3  Language- read & follow direction 0 0  Write a sentence 0 0  Copy  design 0 0  Total score 17 20     6CIT Screen 03/08/2021  What Year? 0 points  What month? 0 points  What time? 0 points  Count back from 20 0 points  Months in reverse 0 points  Repeat phrase 10 points  Total Score 10    Immunizations Immunization History  Administered Date(s) Administered   Fluad Quad(high Dose 65+) 10/24/2019, 09/17/2020   Influenza,inj,Quad PF,6+ Mos 11/05/2018   PFIZER(Purple Top)SARS-COV-2 Vaccination 02/03/2019, 02/24/2019, 11/15/2019   Td 01/29/2000, 01/28/2010    TDAP status: Due, Education has been provided regarding the importance of this vaccine. Advised may receive this vaccine at local pharmacy or Health Dept. Aware to provide a copy of the vaccination record if obtained from local pharmacy or Health Dept. Verbalized acceptance and understanding.  Flu Vaccine status: Up to date  Pneumococcal vaccine status: Due, Education has been provided regarding the importance of this vaccine. Advised may receive this vaccine at local pharmacy or Health Dept. Aware to provide a copy of the vaccination record if obtained from local pharmacy or Health Dept. Verbalized acceptance and understanding.  Covid-19 vaccine status: Completed vaccines  Qualifies for Shingles Vaccine? Yes   Zostavax completed No   Shingrix Completed?: No.    Education has been provided regarding the importance of this vaccine. Patient has been advised to call insurance company to determine out of pocket expense if they have not yet received this vaccine. Advised may also receive vaccine at local pharmacy or Health Dept. Verbalized acceptance and understanding.  Screening Tests Health Maintenance  Topic Date Due   Zoster Vaccines- Shingrix (1 of 2) Never done   Pneumonia Vaccine 75+ Years old (1 - PCV) Never done   COVID-19 Vaccine (4 - Booster for Pfizer series) 01/10/2020    TETANUS/TDAP  01/29/2020   INFLUENZA VACCINE  Completed   DEXA SCAN  Completed   HPV VACCINES  Aged Out    Health Maintenance  Health Maintenance Due  Topic Date Due   Zoster Vaccines- Shingrix (1 of 2) Never done   Pneumonia Vaccine 55+ Years old (1 - PCV) Never done   COVID-19 Vaccine (4 - Booster for Pfizer series) 01/10/2020   TETANUS/TDAP  01/29/2020    Colorectal cancer screening: No longer required.   Mammogram status: Completed 01/26/20. Repeat every year  Bone Density status: Completed 06/30/20. Results reflect: Bone density results: OSTEOPOROSIS. Repeat every 2 years.  Lung Cancer Screening: (Low Dose CT Chest recommended if Age 12-80 years, 30 pack-year currently smoking OR have quit w/in 15years.) does not qualify.     Additional Screening:  Hepatitis C Screening: does not qualify  Vision Screening: Recommended annual ophthalmology exams for early detection of glaucoma and other disorders of the eye. Is the patient up to date with their annual eye exam?  Yes  Who is the provider or what is the name of the office in which the patient attends annual eye exams? Dr Ellie Lunch If pt is not established with a provider, would they like to be referred to a provider to establish care? No .   Dental Screening: Recommended annual dental exams for proper oral hygiene  Community Resource Referral / Chronic Care Management: CRR required this visit?  No   CCM required this visit?  No      Plan:     I have personally reviewed and noted the following in the patients chart:   Medical and social history Use of alcohol, tobacco or illicit drugs  Current  medications and supplements including opioid prescriptions.  Functional ability and status Nutritional status Physical activity Advanced directives List of other physicians Hospitalizations, surgeries, and ER visits in previous 12 months Vitals Screenings to include cognitive, depression, and falls Referrals and  appointments  In addition, I have reviewed and discussed with patient certain preventive protocols, quality metrics, and best practice recommendations. A written personalized care plan for preventive services as well as general preventive health recommendations were provided to patient.   Patient declined at this time./ Patient would like to access on my-chart/ per request, patient was mailed a copy of AVS./ Patient preferred to pick up at office at next visit.   Willette Brace, LPN   8/87/1957   Nurse Health Advisor  Nurse Notes: none

## 2021-03-08 NOTE — Patient Instructions (Addendum)
[name] , Thank you for taking time to come for your Medicare Wellness Visit. I appreciate your ongoing commitment to your health goals. Please review the following plan we discussed and let me know if I can assist you in the future.   Screening recommendations/referrals: Colonoscopy: No longer required  Mammogram: Done 01/26/20 repeat every year  Bone Density: Done 06/30/20 repeat every 2 years  Recommended yearly ophthalmology/optometry visit for glaucoma screening and checkup Recommended yearly dental visit for hygiene and checkup  Vaccinations: Influenza vaccine: Done 09/17/20 repeat ever year  Pneumococcal vaccine: Due and discussed  Tdap vaccine: Due and discussed  Shingles vaccine: Shingrix discussed. Please contact your pharmacy for coverage information.    Covid-19:Completed 1/18, 2/8, & 11/15/19  Advanced directives: Please bring a copy of your health care power of attorney and living will to the office at your convenience.  Conditions/risks identified: None at this time   Next appointment: Follow up in one year for your annual wellness visit    Preventive Care 65 Years and Older, Female Preventive care refers to lifestyle choices and visits with your health care provider that can promote health and wellness. What does preventive care include? A yearly physical exam. This is also called an annual well check. Dental exams once or twice a year. Routine eye exams. Ask your health care provider how often you should have your eyes checked. Personal lifestyle choices, including: Daily care of your teeth and gums. Regular physical activity. Eating a healthy diet. Avoiding tobacco and drug use. Limiting alcohol use. Practicing safe sex. Taking low-dose aspirin every day. Taking vitamin and mineral supplements as recommended by your health care provider. What happens during an annual well check? The services and screenings done by your health care provider during your  annual well check will depend on your age, overall health, lifestyle risk factors, and family history of disease. Counseling  Your health care provider may ask you questions about your: Alcohol use. Tobacco use. Drug use. Emotional well-being. Home and relationship well-being. Sexual activity. Eating habits. History of falls. Memory and ability to understand (cognition). Work and work Statistician. Reproductive health. Screening  You may have the following tests or measurements: Height, weight, and BMI. Blood pressure. Lipid and cholesterol levels. These may be checked every 5 years, or more frequently if you are over 48 years old. Skin check. Lung cancer screening. You may have this screening every year starting at age 67 if you have a 30-pack-year history of smoking and currently smoke or have quit within the past 15 years. Fecal occult blood test (FOBT) of the stool. You may have this test every year starting at age 47. Flexible sigmoidoscopy or colonoscopy. You may have a sigmoidoscopy every 5 years or a colonoscopy every 10 years starting at age 63. Hepatitis C blood test. Hepatitis B blood test. Sexually transmitted disease (STD) testing. Diabetes screening. This is done by checking your blood sugar (glucose) after you have not eaten for a while (fasting). You may have this done every 1-3 years. Bone density scan. This is done to screen for osteoporosis. You may have this done starting at age 14. Mammogram. This may be done every 1-2 years. Talk to your health care provider about how often you should have regular mammograms. Talk with your health care provider about your test results, treatment options, and if necessary, the need for more tests. Vaccines  Your health care provider may recommend certain vaccines, such as: Influenza vaccine. This is recommended every year.  Tetanus, diphtheria, and acellular pertussis (Tdap, Td) vaccine. You may need a Td booster every 10  years. Zoster vaccine. You may need this after age 42. Pneumococcal 13-valent conjugate (PCV13) vaccine. One dose is recommended after age 40. Pneumococcal polysaccharide (PPSV23) vaccine. One dose is recommended after age 30. Talk to your health care provider about which screenings and vaccines you need and how often you need them. This information is not intended to replace advice given to you by your health care provider. Make sure you discuss any questions you have with your health care provider. Document Released: 01/29/2015 Document Revised: 09/22/2015 Document Reviewed: 11/03/2014 Elsevier Interactive Patient Education  2017 [place] Prevention in the Home Falls can cause injuries. They can happen to people of all ages. There are many things you can do to make your home safe and to help prevent falls. What can I do on the outside of my home? Regularly fix the edges of walkways and driveways and fix any cracks. Remove anything that might make you trip as you walk through a door, such as a raised step or threshold. Trim any bushes or trees on the path to your home. Use bright outdoor lighting. Clear any walking paths of anything that might make someone trip, such as rocks or tools. Regularly check to see if handrails are loose or broken. Make sure that both sides of any steps have handrails. Any raised decks and porches should have guardrails on the edges. Have any leaves, snow, or ice cleared regularly. Use sand or salt on walking paths during winter. Clean up any spills in your garage right away. This includes oil or grease spills. What can I do in the bathroom? Use night lights. Install grab bars by the toilet and in the tub and shower. Do not use towel bars as grab bars. Use non-skid mats or decals in the tub or shower. If you need to sit down in the shower, use a plastic, non-slip stool. Keep the floor dry. Clean up any water that spills on the floor as soon as it  happens. Remove soap buildup in the tub or shower regularly. Attach bath mats securely with double-sided non-slip rug tape. Do not have throw rugs and other things on the floor that can make you trip. What can I do in the bedroom? Use night lights. Make sure that you have a light by your bed that is easy to reach. Do not use any sheets or blankets that are too big for your bed. They should not hang down onto the floor. Have a firm chair that has side arms. You can use this for support while you get dressed. Do not have throw rugs and other things on the floor that can make you trip. What can I do in the kitchen? Clean up any spills right away. Avoid walking on wet floors. Keep items that you use a lot in easy-to-reach places. If you need to reach something above you, use a strong step stool that has a grab bar. Keep electrical cords out of the way. Do not use floor polish or wax that makes floors slippery. If you must use wax, use non-skid floor wax. Do not have throw rugs and other things on the floor that can make you trip. What can I do with my stairs? Do not leave any items on the stairs. Make sure that there are handrails on both sides of the stairs and use them. Fix handrails that are broken or loose.  Make sure that handrails are as long as the stairways. Check any carpeting to make sure that it is firmly attached to the stairs. Fix any carpet that is loose or worn. Avoid having throw rugs at the top or bottom of the stairs. If you do have throw rugs, attach them to the floor with carpet tape. Make sure that you have a light switch at the top of the stairs and the bottom of the stairs. If you do not have them, ask someone to add them for you. What else can I do to help prevent falls? Wear shoes that: Do not have high heels. Have rubber bottoms. Are comfortable and fit you well. Are closed at the toe. Do not wear sandals. If you use a stepladder: Make sure that it is fully opened.  Do not climb a closed stepladder. Make sure that both sides of the stepladder are locked into place. Ask someone to hold it for you, if possible. Clearly mark and make sure that you can see: Any grab bars or handrails. First and last steps. Where the edge of each step is. Use tools that help you move around (mobility aids) if they are needed. These include: Canes. Walkers. Scooters. Crutches. Turn on the lights when you go into a dark area. Replace any light bulbs as soon as they burn out. Set up your furniture so you have a clear path. Avoid moving your furniture around. If any of your floors are uneven, fix them. If there are any pets around you, be aware of where they are. Review your medicines with your doctor. Some medicines can make you feel dizzy. This can increase your chance of falling. Ask your doctor what other things that you can do to help prevent falls. This information is not intended to replace advice given to you by your health care provider. Make sure you discuss any questions you have with your health care provider. Document Released: 10/29/2008 Document Revised: 06/10/2015 Document Reviewed: 02/06/2014 Elsevier Interactive Patient Education  2017 [employer].

## 2021-03-26 ENCOUNTER — Other Ambulatory Visit: Payer: Self-pay | Admitting: Family Medicine

## 2021-04-04 ENCOUNTER — Encounter: Payer: Self-pay | Admitting: Endocrinology

## 2021-04-04 ENCOUNTER — Other Ambulatory Visit: Payer: Self-pay

## 2021-04-04 ENCOUNTER — Ambulatory Visit (INDEPENDENT_AMBULATORY_CARE_PROVIDER_SITE_OTHER): Payer: Medicare Other | Admitting: Endocrinology

## 2021-04-04 VITALS — BP 140/76 | HR 81 | Ht 60.0 in | Wt 143.6 lb

## 2021-04-04 DIAGNOSIS — E213 Hyperparathyroidism, unspecified: Secondary | ICD-10-CM

## 2021-04-04 NOTE — Patient Instructions (Signed)
Blood tests are requested for you today.  We'll let you know about the results.  ?Based on the results, I hope to be able to prescribe for you a pill to lower the calcium.   ?Please come back for a follow-up appointment in 1 month.   ?

## 2021-04-04 NOTE — Progress Notes (Signed)
? ?Subjective:  ? ? Patient ID: [name], female    DOB: 29-Nov-1932, 88 y.o.   MRN: [id number] ? ?HPI ?Pt returns for f/u of primary hyperparathyroidism (dx'ed 2016; she has never had urolithiasis or bony fracture; she is not a surgical candidate; she stopped Vit-D, due to high level; she was dx'ed with osteoporosis in 2022).  No change in chronic memory loss.   ?Past Medical History:  ?Diagnosis Date  ? Anxiety   ? AR (allergic rhinitis)   ? Atrial fibrillation ([place])   ? Basal cell carcinoma of ala nasi 2014  ? Benign neoplasm stomach body   ? Cataract   ? corrected with surgery  ? Colitis   ? CTS (carpal tunnel syndrome)   ? Diverticulitis   ? Diverticulosis   ? Esophageal stricture   ? Frequent headaches   ? likely migraine  ? GERD (gastroesophageal reflux disease)   ? Hepatic steatosis   ? Hyperlipidemia   ? Hypertension   ? IBS (irritable bowel syndrome)   ? Insomnia   ? Internal hemorrhoids   ? Osteopenia   ? ? ?Past Surgical History:  ?Procedure Laterality Date  ? CATARACT EXTRACTION, BILATERAL    ? August 2018, October 2018  ? CHOLECYSTECTOMY    ? MOHS SURGERY  2014  ? nose  ? ? ?Social History  ? ?Socioeconomic History  ? Marital status: Married  ?  Spouse name: Not on file  ? Number of children: Not on file  ? Years of education: Not on file  ? Highest education level: Not on file  ?Occupational History  ? Not on file  ?Tobacco Use  ? Smoking status: Never  ? Smokeless tobacco: Never  ?Vaping Use  ? Vaping Use: Never used  ?Substance and Sexual Activity  ? Alcohol use: No  ? Drug use: No  ? Sexual activity: Not Currently  ?  Partners: Male  ?Other Topics Concern  ? Not on file  ?Social History Narrative  ? Widowed 2023- was married 1957  ? 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 2 kids  ? ?Social Determinants of Health  ? ?Financial Resource Strain: Low Risk   ? Difficulty of Paying Living Expenses: Not hard at all  ?Food Insecurity: No Food Insecurity  ? Worried  About Charity fundraiser in the Last Year: Never true  ? Ran Out of Food in the Last Year: Never true  ?Transportation Needs: No Transportation Needs  ? Lack of Transportation (Medical): No  ? Lack of Transportation (Non-Medical): No  ?Physical Activity: Inactive  ? Days of Exercise per Week: 0 days  ? Minutes of Exercise per Session: 0 min  ?Stress: No Stress Concern Present  ? Feeling of Stress : Not at all  ?Social Connections: Socially Isolated  ? Frequency of Communication with Friends and Family: More than three times a week  ? Frequency of Social Gatherings with Friends and Family: More than three times a week  ? Attends Religious Services: Never  ? Active Member of Clubs or Organizations: No  ? Attends Archivist Meetings: Never  ? Marital Status: Widowed  ?Intimate Partner Violence: Not At Risk  ? Fear of Current or Ex-Partner: No  ? Emotionally Abused: No  ? Physically Abused: No  ? Sexually Abused: No  ? ? ?Current Outpatient Medications on File Prior to Visit  ?Medication Sig Dispense Refill  ? BAC 50-325-40 MG tablet TAKE  1 TABLET BY MOUTH 2 TIMES DAILY AS NEEDED. 100 tablet 1  ? dicyclomine (BENTYL) 10 MG capsule TAKE 1 CAPSULE BY MOUTH UP TO 4 TIMES A DAY AS NEEDED FOR ABDOMINAL CRAMPS 100 capsule 1  ? ELIQUIS 2.5 MG TABS tablet TAKE 1 TABLET BY MOUTH TWICE A DAY 60 tablet 5  ? ezetimibe (ZETIA) 10 MG tablet TAKE 1 TABLET BY MOUTH DAILY 90 tablet 1  ? meclizine (ANTIVERT) 25 MG tablet Take 25 mg by mouth 3 (three) times daily as needed. For vertigo    ? melatonin 5 MG TABS Take 5 mg by mouth.    ? Multiple Vitamin (MULTIVITAMIN) tablet Take 1 tablet by mouth daily.    ? omeprazole (PRILOSEC) 20 MG capsule Take 1 capsule (20 mg total) by mouth daily. (Patient taking differently: Take 20 mg by mouth daily. As needed)    ? Probiotic Product (SOLUBLE FIBER/PROBIOTICS PO) Take by mouth.    ? rosuvastatin (CRESTOR) 5 MG tablet TAKE 1/2 TABLET BY MOUTH 5 DAYS A WEEK 30 tablet 1  ? ?No current  facility-administered medications on file prior to visit.  ? ? ?Allergies  ?Allergen Reactions  ? Aspirin   ? Ciprofloxacin   ? Corticosteroids   ?  Presumed cause of rash/itching.  Do not use prednisone  ? Nexium [Esomeprazole Magnesium]   ? Niacin And Related   ? Propylene Glycol   ? Vagifem [Estradiol]   ? Allegra [Fexofenadine]   ? Bacitracin-Polymyxin B   ? Ceclor [Cefaclor]   ? Celebrex [Celecoxib]   ? Doxepin Hcl   ? Erythromycin   ? Flagyl [Metronidazole]   ? Hydrocortisone Other (See Comments)  ?  After use of 1% HC cream, pt reported burning and feeling "scalded" in vaginal area.  ? Levofloxacin   ? Levsin [Hyoscyamine Sulfate]   ? Lidocaine Other (See Comments)  ?  Unknown reaction  ? Macrobid [Nitrofurantoin Macrocrystal]   ? Marcaine [Bupivacaine Hcl]   ? Nsaids   ? Penicillins   ? Prevacid [Lansoprazole]   ?  Able to tolerate omeprazole.   ? Pseudoephedrine   ? Sulfonamide Derivatives   ? Tavist [Clemastine]   ? Cortisone Rash  ?  Any hydrocortisone cream component with severe vaginal burning  ? ? ?Family History  ?Problem Relation Age of Onset  ? Cancer Other   ? Diabetes Other   ? CAD Brother   ? Leukemia Father   ? Pancreatic cancer Brother   ? CAD Mother   ? Breast cancer Sister   ? Mesothelioma Brother   ? Colon cancer Neg Hx   ? Hypercalcemia Neg Hx   ? ? ?BP 140/76 (BP Location: Left Arm, Patient Position: Sitting, Cuff Size: Normal)   Pulse 81   Ht 5' (1.524 m)   Wt 143 lb 9.6 oz (65.1 kg)   LMP 01/17/1988 (Approximate)   SpO2 95%   BMI 28.04 kg/m?  ? ? ?Review of Systems ?Denies falls ?   ?Objective:  ? Physical Exam ?VITAL SIGNS:  See vs page ?GENERAL: no distress ?GAIT: normal and steady.   ? ?Lab Results  ?Component Value Date  ? PTH 82 (H) 04/04/2021  ? CALCIUM 11.4 (H) 04/04/2021  ? PHOS 4.0 06/24/2008  ? ? ?   ?Assessment & Plan:  ?Primary hyperparathyroidism, uncontrolled.  She is not a surgical candidate.  we discussed.  Plan will be low-dose cinacalcet, with the goal of some  reduction in Ca++ level.  The hope is that  this might help memory loss, but my experience with this goal in the past has been disappointing.  We agreed to try.   ?Please come back for a follow-up appointment in 1 month. ? ? ? ?

## 2021-04-05 LAB — PTH, INTACT AND CALCIUM
Calcium: 11.4 mg/dL — ABNORMAL HIGH (ref 8.6–10.4)
PTH: 82 pg/mL — ABNORMAL HIGH (ref 16–77)

## 2021-04-05 MED ORDER — CINACALCET HCL 30 MG PO TABS: 30.0000 mg | ORAL_TABLET | ORAL | 2 refills | Status: DC

## 2021-04-11 ENCOUNTER — Ambulatory Visit: Payer: Medicare Other | Admitting: Endocrinology

## 2021-04-14 ENCOUNTER — Encounter: Payer: Self-pay | Admitting: Family Medicine

## 2021-04-14 ENCOUNTER — Telehealth: Payer: Medicare Other | Admitting: Family Medicine

## 2021-04-14 DIAGNOSIS — R413 Other amnesia: Secondary | ICD-10-CM

## 2021-04-14 DIAGNOSIS — E213 Hyperparathyroidism, unspecified: Secondary | ICD-10-CM

## 2021-04-14 NOTE — Progress Notes (Signed)
Called and talked with patient's daughter.  Patient was not on the phone call. ? ?Hasn't started cinacalcet yet.  It is on backorder per pharmacy.  We talked about options and rationale for treatment.  I think it makes sense to try the medication, given the fact that she would not be a good surgical candidate. ? ?Per daughter's report, her memory isn't better.  She frequently asks about the day/date, location of family members. She still recognized family but repeats herself/questions.  Patient's sister is still living with patient temporarily, has noted patient getting anxious/tearful occ.  Daughter that patient was depressed about the death of her husband, she misses him.  ? ?Daughter got a call from [facility] about a housing unit that was open but patient said "I'm not ready to go there yet."  Patient isn't driving, her daughter has the car keys.  This is a source of contention between the two of them.  Then the patient didn't remember the conversation after the fact.   ? ?Daughter notified me about incoming paperwork from [facility].  I will await that.   ? ?No exam. ?

## 2021-04-17 NOTE — Assessment & Plan Note (Signed)
She has progressive memory loss and she repeats herself.  She is not driving.  Discussed her situation.  Patient still misses her husband, as expected.  Discussed placement.  I will await incoming paperwork from [facility].  Daughter still talking to the patient about placement.  I think it makes sense for that be an ongoing conversation and patient/daughter can update me as needed.  I thanked patient's daughter for her help. ?

## 2021-04-17 NOTE — Assessment & Plan Note (Signed)
I think it makes sense to try Cinacalcet per endocrine recommendation. ?

## 2021-04-18 ENCOUNTER — Other Ambulatory Visit: Payer: Self-pay | Admitting: Family Medicine

## 2021-04-18 NOTE — Telephone Encounter (Signed)
Refill request for butalbital-acetaminophen-caffeine (FIORICET) ? ?LOV - 03/18/21 ?Next OV - 05/03/21 ?Last refill - 05/12/20 #100/1 ? ?*Also needs eliquis refilled ? ?

## 2021-04-19 NOTE — Telephone Encounter (Signed)
Sent. Thanks.   

## 2021-05-03 ENCOUNTER — Encounter: Payer: Self-pay | Admitting: Family Medicine

## 2021-05-03 ENCOUNTER — Ambulatory Visit (INDEPENDENT_AMBULATORY_CARE_PROVIDER_SITE_OTHER): Payer: Medicare Other | Admitting: Family Medicine

## 2021-05-03 DIAGNOSIS — R413 Other amnesia: Secondary | ICD-10-CM | POA: Diagnosis not present

## 2021-05-03 DIAGNOSIS — E213 Hyperparathyroidism, unspecified: Secondary | ICD-10-CM | POA: Diagnosis not present

## 2021-05-03 NOTE — Patient Instructions (Addendum)
Keep taking the cinacalcet 3 times a week and keep the endocrine appointment.  We'll go from there.   ? ?I think that moving into [facility] could be a good change, even if it is a big change right now.  ? ?Take care.  Glad to see you. ?

## 2021-05-03 NOTE — Progress Notes (Signed)
Started on cinacalcet in the meantime.  On 3rd week of medicine.  Daughter helping with med use given patient's history of memory loss.  She is going to follow-up with endocrinology.  Her endocrinologist will be retiring later this year but patient should be able to see him in the meantime.  Hypercalcemia pathophysiology discussed with patient and daughter. ? ?Memory d/w pt. memory loss noted by patient and by daughter.  She needs help managing medications.  She is having grief related to the death of her husband, as would be expected.  Her daughter lives next door.  Her sister moved in with her temporarily.  This is not a long-term intervention, as her sister is going to move out eventually.  Patient's husband had previously applied for an apartment at [facility].  That spot has become available in the meantime.  Discussed with patient about options. ? ?At this point she does not want to go to [facility] and she wants to stay at her home with her sister.  Discussed that if her sister does not plan to stay there permanently then she would need to consider her options and make alternative plans.  If she does not move into [facility] when this apartment is available, she may not have that option again in the near future.  It may be months before she had a similar opportunity.  I told the patient that I cannot force her to do anything but I would encourage her to consider options.  It may be that by moving [facility] she would have more social interaction and this could be good for her.  They could also help with medication management, etc.  She already knows some people/has friends at [facility] and that may help with a "soft landing". ? ?Meds, vitals, and allergies reviewed.  ? ?ROS: Per HPI unless specifically indicated in ROS section  ? ?GEN: nad, alert and pleasant conversation.  Tearful about the death of her husband but regains composure. ?HEENT: ncat ?NECK: supple w/o LA ?CV: rrr. ?PULM: ctab, no inc wob ?ABD:  soft, +bs ?EXT: no edema ?SKIN: Well-perfused. ? ?34 minutes were devoted to patient care in this encounter (this includes time spent reviewing the patient's file/history, interviewing and examining the patient, counseling/reviewing plan with patient).  ? ?

## 2021-05-05 NOTE — Assessment & Plan Note (Signed)
No change in medication.  The main issue is her choice about housing.  She still able to make her own decisions at this point but I encouraged her to consider her options.  See above.  Detailed conversation with patient and daughter. ?

## 2021-05-05 NOTE — Assessment & Plan Note (Signed)
Started on cinacalcet in the meantime.  On 3rd week of medicine.  Daughter helping with med use given patient's history of memory loss.  She is going to follow-up with endocrinology.  Her endocrinologist will be retiring later this year but patient should be able to see him in the meantime.  Hypercalcemia pathophysiology discussed with patient and daughter.  Will await endocrinology follow-up note. ?

## 2021-05-12 DIAGNOSIS — L570 Actinic keratosis: Secondary | ICD-10-CM | POA: Diagnosis not present

## 2021-05-12 DIAGNOSIS — Z85828 Personal history of other malignant neoplasm of skin: Secondary | ICD-10-CM | POA: Diagnosis not present

## 2021-05-12 DIAGNOSIS — L820 Inflamed seborrheic keratosis: Secondary | ICD-10-CM | POA: Diagnosis not present

## 2021-05-12 DIAGNOSIS — L821 Other seborrheic keratosis: Secondary | ICD-10-CM | POA: Diagnosis not present

## 2021-05-16 ENCOUNTER — Encounter: Payer: Self-pay | Admitting: Endocrinology

## 2021-05-16 ENCOUNTER — Ambulatory Visit (INDEPENDENT_AMBULATORY_CARE_PROVIDER_SITE_OTHER): Payer: Medicare Other | Admitting: Endocrinology

## 2021-05-16 VITALS — BP 112/82 | HR 96 | Ht 60.0 in | Wt 143.0 lb

## 2021-05-16 DIAGNOSIS — E213 Hyperparathyroidism, unspecified: Secondary | ICD-10-CM | POA: Diagnosis not present

## 2021-05-16 LAB — BASIC METABOLIC PANEL
BUN: 9 mg/dL (ref 6–23)
CO2: 29 mEq/L (ref 19–32)
Calcium: 10.4 mg/dL (ref 8.4–10.5)
Chloride: 103 mEq/L (ref 96–112)
Creatinine, Ser: 0.83 mg/dL (ref 0.40–1.20)
GFR: 63.73 mL/min (ref >60.00)
Glucose, Bld: 95 mg/dL (ref 70–99)
Potassium: 4.8 mEq/L (ref 3.5–5.1)
Sodium: 139 mEq/L (ref 135–145)

## 2021-05-16 NOTE — Progress Notes (Signed)
? ?Subjective:  ? ? Patient ID: [name], female    DOB: 11-09-1932, 88 y.o.   MRN: [id number] ? ?HPI ?Pt returns for f/u of primary hyperparathyroidism (dx'ed 2016; she has never had urolithiasis or bony fracture; she is not a surgical candidate; she stopped Vit-D, due to high level; she was dx'ed with osteoporosis in 2022).  Dtr again says no change in chronic memory loss.   ?Past Medical History:  ?Diagnosis Date  ? Anxiety   ? AR (allergic rhinitis)   ? Atrial fibrillation ([place])   ? Basal cell carcinoma of ala nasi 2014  ? Benign neoplasm stomach body   ? Cataract   ? corrected with surgery  ? Colitis   ? CTS (carpal tunnel syndrome)   ? Diverticulitis   ? Diverticulosis   ? Esophageal stricture   ? Frequent headaches   ? likely migraine  ? GERD (gastroesophageal reflux disease)   ? Hepatic steatosis   ? Hyperlipidemia   ? Hypertension   ? IBS (irritable bowel syndrome)   ? Insomnia   ? Internal hemorrhoids   ? Osteopenia   ? ? ?Past Surgical History:  ?Procedure Laterality Date  ? CATARACT EXTRACTION, BILATERAL    ? August 2018, October 2018  ? CHOLECYSTECTOMY    ? MOHS SURGERY  2014  ? nose  ? ? ?Social History  ? ?Socioeconomic History  ? Marital status: Married  ?  Spouse name: Not on file  ? Number of children: Not on file  ? Years of education: Not on file  ? Highest education level: Not on file  ?Occupational History  ? Not on file  ?Tobacco Use  ? Smoking status: Never  ? Smokeless tobacco: Never  ?Vaping Use  ? Vaping Use: Never used  ?Substance and Sexual Activity  ? Alcohol use: No  ? Drug use: No  ? Sexual activity: Not Currently  ?  Partners: Male  ?Other Topics Concern  ? Not on file  ?Social History Narrative  ? Widowed 2023- was married 1957  ? 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 2 kids  ? ?Social Determinants of Health  ? ?Financial Resource Strain: Low Risk   ? Difficulty of Paying Living Expenses: Not hard at all  ?Food Insecurity: No Food Insecurity   ? Worried About Charity fundraiser in the Last Year: Never true  ? Ran Out of Food in the Last Year: Never true  ?Transportation Needs: No Transportation Needs  ? Lack of Transportation (Medical): No  ? Lack of Transportation (Non-Medical): No  ?Physical Activity: Inactive  ? Days of Exercise per Week: 0 days  ? Minutes of Exercise per Session: 0 min  ?Stress: No Stress Concern Present  ? Feeling of Stress : Not at all  ?Social Connections: Socially Isolated  ? Frequency of Communication with Friends and Family: More than three times a week  ? Frequency of Social Gatherings with Friends and Family: More than three times a week  ? Attends Religious Services: Never  ? Active Member of Clubs or Organizations: No  ? Attends Archivist Meetings: Never  ? Marital Status: Widowed  ?Intimate Partner Violence: Not At Risk  ? Fear of Current or Ex-Partner: No  ? Emotionally Abused: No  ? Physically Abused: No  ? Sexually Abused: No  ? ? ?Current Outpatient Medications on File Prior to Visit  ?Medication Sig Dispense Refill  ? butalbital-acetaminophen-caffeine (FIORICET)  50-325-40 MG tablet TAKE 1 TABLET BY MOUTH 2 TIMES DAILY AS NEEDED. 100 tablet 1  ? cinacalcet (SENSIPAR) 30 MG tablet Take 1 tablet (30 mg total) by mouth 3 (three) times a week. 30 tablet 2  ? dicyclomine (BENTYL) 10 MG capsule TAKE 1 CAPSULE BY MOUTH UP TO 4 TIMES A DAY AS NEEDED FOR ABDOMINAL CRAMPS 100 capsule 1  ? ELIQUIS 2.5 MG TABS tablet TAKE 1 TABLET BY MOUTH TWICE A DAY 60 tablet 5  ? ezetimibe (ZETIA) 10 MG tablet TAKE 1 TABLET BY MOUTH DAILY 90 tablet 1  ? meclizine (ANTIVERT) 25 MG tablet Take 25 mg by mouth 3 (three) times daily as needed. For vertigo    ? melatonin 5 MG TABS Take 5 mg by mouth.    ? Multiple Vitamin (MULTIVITAMIN) tablet Take 1 tablet by mouth daily.    ? omeprazole (PRILOSEC) 20 MG capsule Take 1 capsule (20 mg total) by mouth daily.    ? Probiotic Product (SOLUBLE FIBER/PROBIOTICS PO) Take by mouth.    ?  rosuvastatin (CRESTOR) 5 MG tablet TAKE 1/2 TABLET BY MOUTH 5 DAYS A WEEK 30 tablet 1  ? ?No current facility-administered medications on file prior to visit.  ? ? ?Allergies  ?Allergen Reactions  ? Aspirin   ? Ciprofloxacin   ? Corticosteroids   ?  Presumed cause of rash/itching.  Do not use prednisone  ? Nexium [Esomeprazole Magnesium]   ? Niacin And Related   ? Propylene Glycol   ? Vagifem [Estradiol]   ? Allegra [Fexofenadine]   ? Bacitracin-Polymyxin B   ? Ceclor [Cefaclor]   ? Celebrex [Celecoxib]   ? Doxepin Hcl   ? Erythromycin   ? Flagyl [Metronidazole]   ? Hydrocortisone Other (See Comments)  ?  After use of 1% HC cream, pt reported burning and feeling "scalded" in vaginal area.  ? Levofloxacin   ? Levsin [Hyoscyamine Sulfate]   ? Lidocaine Other (See Comments)  ?  Unknown reaction  ? Macrobid [Nitrofurantoin Macrocrystal]   ? Marcaine [Bupivacaine Hcl]   ? Nsaids   ? Penicillins   ? Prevacid [Lansoprazole]   ?  Able to tolerate omeprazole.   ? Pseudoephedrine   ? Sulfonamide Derivatives   ? Tavist [Clemastine]   ? Cortisone Rash  ?  Any hydrocortisone cream component with severe vaginal burning  ? ? ?Family History  ?Problem Relation Age of Onset  ? Cancer Other   ? Diabetes Other   ? CAD Brother   ? Leukemia Father   ? Pancreatic cancer Brother   ? CAD Mother   ? Breast cancer Sister   ? Mesothelioma Brother   ? Colon cancer Neg Hx   ? Hypercalcemia Neg Hx   ? ? ?BP 112/82 (BP Location: Left Arm, Patient Position: Sitting, Cuff Size: Normal)   Pulse 96   Ht 5' (1.524 m)   Wt 143 lb (64.9 kg)   LMP 01/17/1988 (Approximate)   SpO2 94%   BMI 27.93 kg/m?  ? ? ?Review of Systems ? ?   ?Objective:  ? Physical Exam ?VITAL SIGNS:  See vs page.  ?GENERAL: no distress.  ?GAIT: slow but steady.   ? ? ?   ?Assessment & Plan:  ?Primary hyperparathyroidism: well-controlled.  Please continue the same sensipar. ? ?

## 2021-05-16 NOTE — Patient Instructions (Addendum)
Blood tests are requested for you today.  We'll let you know about the results.   ?Please see Dr Damita Dunnings for a follow-up appointment in 6 weeks.    ?

## 2021-05-17 ENCOUNTER — Telehealth: Payer: Self-pay

## 2021-05-17 NOTE — Telephone Encounter (Signed)
Form done.  Please attach med and allergy list.  Please scan and send.  Thanks.   ?

## 2021-05-17 NOTE — Telephone Encounter (Signed)
Form has been faxed back.

## 2021-05-17 NOTE — Telephone Encounter (Signed)
Received call from [place] at [facility] states that request for [facility] and verbal orders were faxed over on 4/25. No response received just following up on that.   ? ?Debbie  ?[phone] ?

## 2021-05-24 NOTE — Telephone Encounter (Signed)
Debbie from [facility] called stating, they received the fax from Dr. Damita Dunnings but the medication list was not attached. Asking if Dr. Damita Dunnings could resend that to them.  ? ?Callback Number: [phone] ?

## 2021-05-24 NOTE — Telephone Encounter (Signed)
Form and med list have been faxed back again. ?

## 2021-06-08 DIAGNOSIS — I7091 Generalized atherosclerosis: Secondary | ICD-10-CM | POA: Diagnosis not present

## 2021-06-08 DIAGNOSIS — M2041 Other hammer toe(s) (acquired), right foot: Secondary | ICD-10-CM | POA: Diagnosis not present

## 2021-06-08 DIAGNOSIS — B351 Tinea unguium: Secondary | ICD-10-CM | POA: Diagnosis not present

## 2021-06-08 DIAGNOSIS — M2042 Other hammer toe(s) (acquired), left foot: Secondary | ICD-10-CM | POA: Diagnosis not present

## 2021-06-08 DIAGNOSIS — L84 Corns and callosities: Secondary | ICD-10-CM | POA: Diagnosis not present

## 2021-06-27 ENCOUNTER — Ambulatory Visit (INDEPENDENT_AMBULATORY_CARE_PROVIDER_SITE_OTHER): Payer: Medicare Other | Admitting: Family Medicine

## 2021-06-27 ENCOUNTER — Encounter: Payer: Self-pay | Admitting: Family Medicine

## 2021-06-27 VITALS — BP 122/78 | HR 66 | Temp 97.7°F | Ht 60.0 in | Wt 146.0 lb

## 2021-06-27 DIAGNOSIS — F419 Anxiety disorder, unspecified: Secondary | ICD-10-CM | POA: Diagnosis not present

## 2021-06-27 DIAGNOSIS — E213 Hyperparathyroidism, unspecified: Secondary | ICD-10-CM | POA: Diagnosis not present

## 2021-06-27 LAB — BASIC METABOLIC PANEL
BUN: 11 mg/dL (ref 6–23)
CO2: 26 mEq/L (ref 19–32)
Calcium: 10.6 mg/dL — ABNORMAL HIGH (ref 8.4–10.5)
Chloride: 104 mEq/L (ref 96–112)
Creatinine, Ser: 0.83 mg/dL (ref 0.40–1.20)
GFR: 63.68 mL/min (ref >60.00)
Glucose, Bld: 101 mg/dL — ABNORMAL HIGH (ref 70–99)
Potassium: 4.6 mEq/L (ref 3.5–5.1)
Sodium: 137 mEq/L (ref 135–145)

## 2021-06-27 NOTE — Patient Instructions (Signed)
Let me see about follow up after checking labs.   Go to the lab on the way out.   If you have mychart we'll likely use that to update you.    Take care.  Glad to see you.

## 2021-06-27 NOTE — Progress Notes (Unsigned)
We talked about the adjustment to moving to [facility].  She is good people around her but it was a big change.  Tearful but "I'm trying" and she regains composure.  She talked with a counselor last week and will f/u with them.  She wanted to defer extra meds at this point.  She is participating in social activities and going to meals.  She was walking.      Meds, vitals, and allergies reviewed.   ROS: Per HPI unless specifically indicated in ROS section   See about follow up after checking labs.

## 2021-06-28 LAB — PTH, INTACT AND CALCIUM
Calcium: 10.6 mg/dL — ABNORMAL HIGH (ref 8.6–10.4)
PTH: 86 pg/mL — ABNORMAL HIGH (ref 16–77)

## 2021-06-29 NOTE — Assessment & Plan Note (Signed)
Exacerbated by memory loss and adjustment with moving to [facility] but she is still involved in social activities and seeing counseling.  She can update me as needed.

## 2021-06-29 NOTE — Assessment & Plan Note (Signed)
See notes on labs.  Continue Sensipar for now.  I am not expecting her calcium to fully normalize but hopefully his medication and dose will make her levels reasonable.

## 2021-06-30 DIAGNOSIS — M810 Age-related osteoporosis without current pathological fracture: Secondary | ICD-10-CM | POA: Diagnosis not present

## 2021-06-30 DIAGNOSIS — R278 Other lack of coordination: Secondary | ICD-10-CM | POA: Diagnosis not present

## 2021-06-30 DIAGNOSIS — R4189 Other symptoms and signs involving cognitive functions and awareness: Secondary | ICD-10-CM | POA: Diagnosis not present

## 2021-06-30 DIAGNOSIS — E785 Hyperlipidemia, unspecified: Secondary | ICD-10-CM | POA: Diagnosis not present

## 2021-06-30 DIAGNOSIS — K589 Irritable bowel syndrome without diarrhea: Secondary | ICD-10-CM | POA: Diagnosis not present

## 2021-06-30 DIAGNOSIS — R413 Other amnesia: Secondary | ICD-10-CM | POA: Diagnosis not present

## 2021-06-30 DIAGNOSIS — I4891 Unspecified atrial fibrillation: Secondary | ICD-10-CM | POA: Diagnosis not present

## 2021-06-30 DIAGNOSIS — M6281 Muscle weakness (generalized): Secondary | ICD-10-CM | POA: Diagnosis not present

## 2021-07-01 DIAGNOSIS — K589 Irritable bowel syndrome without diarrhea: Secondary | ICD-10-CM | POA: Diagnosis not present

## 2021-07-01 DIAGNOSIS — E785 Hyperlipidemia, unspecified: Secondary | ICD-10-CM | POA: Diagnosis not present

## 2021-07-01 DIAGNOSIS — M810 Age-related osteoporosis without current pathological fracture: Secondary | ICD-10-CM | POA: Diagnosis not present

## 2021-07-01 DIAGNOSIS — I4891 Unspecified atrial fibrillation: Secondary | ICD-10-CM | POA: Diagnosis not present

## 2021-07-01 DIAGNOSIS — M6281 Muscle weakness (generalized): Secondary | ICD-10-CM | POA: Diagnosis not present

## 2021-07-01 DIAGNOSIS — R413 Other amnesia: Secondary | ICD-10-CM | POA: Diagnosis not present

## 2021-07-05 DIAGNOSIS — M6281 Muscle weakness (generalized): Secondary | ICD-10-CM | POA: Diagnosis not present

## 2021-07-05 DIAGNOSIS — I4891 Unspecified atrial fibrillation: Secondary | ICD-10-CM | POA: Diagnosis not present

## 2021-07-05 DIAGNOSIS — R413 Other amnesia: Secondary | ICD-10-CM | POA: Diagnosis not present

## 2021-07-05 DIAGNOSIS — E785 Hyperlipidemia, unspecified: Secondary | ICD-10-CM | POA: Diagnosis not present

## 2021-07-05 DIAGNOSIS — M810 Age-related osteoporosis without current pathological fracture: Secondary | ICD-10-CM | POA: Diagnosis not present

## 2021-07-05 DIAGNOSIS — K589 Irritable bowel syndrome without diarrhea: Secondary | ICD-10-CM | POA: Diagnosis not present

## 2021-07-06 DIAGNOSIS — I4891 Unspecified atrial fibrillation: Secondary | ICD-10-CM | POA: Diagnosis not present

## 2021-07-06 DIAGNOSIS — E785 Hyperlipidemia, unspecified: Secondary | ICD-10-CM | POA: Diagnosis not present

## 2021-07-06 DIAGNOSIS — M6281 Muscle weakness (generalized): Secondary | ICD-10-CM | POA: Diagnosis not present

## 2021-07-06 DIAGNOSIS — R413 Other amnesia: Secondary | ICD-10-CM | POA: Diagnosis not present

## 2021-07-06 DIAGNOSIS — M810 Age-related osteoporosis without current pathological fracture: Secondary | ICD-10-CM | POA: Diagnosis not present

## 2021-07-06 DIAGNOSIS — K589 Irritable bowel syndrome without diarrhea: Secondary | ICD-10-CM | POA: Diagnosis not present

## 2021-07-11 DIAGNOSIS — M6281 Muscle weakness (generalized): Secondary | ICD-10-CM | POA: Diagnosis not present

## 2021-07-11 DIAGNOSIS — K589 Irritable bowel syndrome without diarrhea: Secondary | ICD-10-CM | POA: Diagnosis not present

## 2021-07-11 DIAGNOSIS — R413 Other amnesia: Secondary | ICD-10-CM | POA: Diagnosis not present

## 2021-07-11 DIAGNOSIS — E785 Hyperlipidemia, unspecified: Secondary | ICD-10-CM | POA: Diagnosis not present

## 2021-07-11 DIAGNOSIS — I4891 Unspecified atrial fibrillation: Secondary | ICD-10-CM | POA: Diagnosis not present

## 2021-07-11 DIAGNOSIS — M810 Age-related osteoporosis without current pathological fracture: Secondary | ICD-10-CM | POA: Diagnosis not present

## 2021-07-14 DIAGNOSIS — E785 Hyperlipidemia, unspecified: Secondary | ICD-10-CM | POA: Diagnosis not present

## 2021-07-14 DIAGNOSIS — I4891 Unspecified atrial fibrillation: Secondary | ICD-10-CM | POA: Diagnosis not present

## 2021-07-14 DIAGNOSIS — M810 Age-related osteoporosis without current pathological fracture: Secondary | ICD-10-CM | POA: Diagnosis not present

## 2021-07-14 DIAGNOSIS — R413 Other amnesia: Secondary | ICD-10-CM | POA: Diagnosis not present

## 2021-07-14 DIAGNOSIS — M6281 Muscle weakness (generalized): Secondary | ICD-10-CM | POA: Diagnosis not present

## 2021-07-14 DIAGNOSIS — K589 Irritable bowel syndrome without diarrhea: Secondary | ICD-10-CM | POA: Diagnosis not present

## 2021-07-26 DIAGNOSIS — K589 Irritable bowel syndrome without diarrhea: Secondary | ICD-10-CM | POA: Diagnosis not present

## 2021-07-26 DIAGNOSIS — R4189 Other symptoms and signs involving cognitive functions and awareness: Secondary | ICD-10-CM | POA: Diagnosis not present

## 2021-07-26 DIAGNOSIS — I4891 Unspecified atrial fibrillation: Secondary | ICD-10-CM | POA: Diagnosis not present

## 2021-07-26 DIAGNOSIS — R278 Other lack of coordination: Secondary | ICD-10-CM | POA: Diagnosis not present

## 2021-07-26 DIAGNOSIS — M810 Age-related osteoporosis without current pathological fracture: Secondary | ICD-10-CM | POA: Diagnosis not present

## 2021-07-26 DIAGNOSIS — R413 Other amnesia: Secondary | ICD-10-CM | POA: Diagnosis not present

## 2021-07-26 DIAGNOSIS — M6281 Muscle weakness (generalized): Secondary | ICD-10-CM | POA: Diagnosis not present

## 2021-07-26 DIAGNOSIS — E785 Hyperlipidemia, unspecified: Secondary | ICD-10-CM | POA: Diagnosis not present

## 2021-08-21 ENCOUNTER — Encounter: Payer: Self-pay | Admitting: Family Medicine

## 2021-09-05 ENCOUNTER — Ambulatory Visit (INDEPENDENT_AMBULATORY_CARE_PROVIDER_SITE_OTHER): Payer: Medicare Other | Admitting: Family Medicine

## 2021-09-05 ENCOUNTER — Encounter: Payer: Self-pay | Admitting: Family Medicine

## 2021-09-05 VITALS — BP 112/68 | HR 77 | Temp 97.7°F | Ht 60.0 in | Wt 142.0 lb

## 2021-09-05 DIAGNOSIS — Z889 Allergy status to unspecified drugs, medicaments and biological substances status: Secondary | ICD-10-CM

## 2021-09-05 DIAGNOSIS — F4321 Adjustment disorder with depressed mood: Secondary | ICD-10-CM

## 2021-09-05 MED ORDER — DEBROX 6.5 % OT SOLN: 5.0000 [drp] | OTIC | 0 refills | Status: DC | PRN

## 2021-09-05 MED ORDER — MILK OF MAGNESIA 7.75 % PO SUSP
5.0000 mL | ORAL | 0 refills | Status: AC | PRN
Start: 2021-09-05 — End: ?

## 2021-09-05 MED ORDER — DIPHENHYDRAMINE HCL 25 MG PO TABS: 25.0000 mg | ORAL_TABLET | Freq: Four times a day (QID) | ORAL | 0 refills | Status: AC | PRN

## 2021-09-05 NOTE — Patient Instructions (Signed)
There are multiple good options with female counselors who could help. Please let me know if/when you are willing to talk to them.   I think it would be a great idea to talk with one of them.  Take care.  Glad to see you.

## 2021-09-05 NOTE — Progress Notes (Unsigned)
We discussed her situation with grief.  She is tearful and she misses her husband.  She says she is ready to die and go to heaven.  She does not have any suicidal or homicidal intent but does not look forward to anything here in the meantime.  She attributed all of her symptoms to grief but I made the point multiple times that if her grief is counterproductive or making her miserable then it would be reasonable to get set up with counseling.  She consented and I put in the order.  Her daughter was here for the conversation and agrees with the plan.  She is living at [facility].  She does have friends there.  She has moved out of her home and that was a difficult transition.  Meds, vitals, and allergies reviewed.   ROS: Per HPI unless specifically indicated in ROS section   Nad Ncat Neck supple, no LA Rrr Ctab Skin well perfused. Tearful but regains composure.  30 minutes were devoted to patient care in this encounter (this includes time spent reviewing the patient's file/history, interviewing and examining the patient, counseling/reviewing plan with patient).

## 2021-09-07 DIAGNOSIS — F4321 Adjustment disorder with depressed mood: Secondary | ICD-10-CM | POA: Insufficient documentation

## 2021-09-07 NOTE — Assessment & Plan Note (Signed)
See above.  Refer to counseling.

## 2021-09-15 ENCOUNTER — Ambulatory Visit (INDEPENDENT_AMBULATORY_CARE_PROVIDER_SITE_OTHER): Payer: Medicare Other | Admitting: Clinical

## 2021-09-15 DIAGNOSIS — F4321 Adjustment disorder with depressed mood: Secondary | ICD-10-CM

## 2021-09-15 NOTE — Progress Notes (Signed)
[facility] Counselor Initial Adult Exam  Name: [name] Date: 09/15/2021 MRN: [id number] DOB: 1932-09-10 PCP: Tonia Ghent, MD  Time spent: 10:31am-11:41am (60 minutes)  Guardian/Payee:  NA    Paperwork requested:  NA  Reason for Visit /Presenting Problem: Patient's daughter, [name], reported patient saw Dr. Damita Dunnings 1 week ago and was referred for today's appointment. Daughter reported patient's husband of 42 years passed away in 02-26-21. Daughter reported her mother moved in May 2023 to [facility], an assisted living facility. Patient stated "I don't care to be here, if the lord takes me today that's fine". Daughter reported her mother has made multiple suicidal statements recently and previously vocalized a plan to shoot herself or overdose. Daughter reported on Tuesday patient made the comment if she had to live at [facility] she would just as soon kill herself. Patient stated, "I  feel everything has been taken away from me". Patient reported she doesn't want to be at [facility] and would prefer to be in her home. Daughter reported patient is unable to live by herself due to changes in memory, such as, not being able to remember taking medication, asking questions repetitively, and not being able to remember when she eats. Daughter reported patient's changes in memory were occurring prior to patient's husbands death. Daughter reported previously patient left pots on the stove and walked away.   Mental Status Exam: Appearance:   Neat     Behavior:  Appropriate  Motor:  Normal  Speech/Language:   Clear and Coherent  Affect:  Tearful  Mood:  sad  Thought process:  normal  Thought content:    WNL  Sensory/Perceptual disturbances:    WNL  Orientation:  oriented to person and place  Attention:  Good  Concentration:  Fair  Memory:  During assessment patient deferred to her daughter frequently in response to questions  Fund of knowledge:   Good  Insight:     Poor  Judgment:   Poor  Impulse Control:  Fair   Reported Symptoms:  Patient stated, "I don't care to be here, if the Reita Cliche takes me today that's fine". Patient reported feeling sad, crying, decreased appetite, loss of interest. Patient's daughter reported this morning patient was on edge and irritable.  Risk Assessment: Danger to Self:   Patient denied current suicidal ideation with plan or intent. Patient stated,"I don't want to kill myself, but I don't care about being here, if the Reita Cliche takes me today I'd be happy". Patient reported no history of suicide attempts. Patient denied current and past homicidal ideation. Patient denied current and past symptoms of psychosis.  Self-injurious Behavior: No Danger to Others: No Duty to Warn:no Physical Aggression / Violence:No  Access to Firearms a concern: No , Daughter reported patient does not have access to firearms or any weapons in the home Gang Involvement:No  Patient / guardian was educated about steps to take if suicide or homicide risk level increases between visits: yes While future psychiatric events cannot be accurately predicted, the patient does not currently require acute inpatient psychiatric care and does not currently meet [facility] involuntary commitment criteria.  Substance Abuse History: Current substance abuse:  Patient reported no current or past alcohol, tobacco, or drug use.      Past Psychiatric History:   No previous psychological problems have been observed Outpatient Providers: None History of Psych Hospitalization: No  Psychological Testing:  None    Abuse History:  Victim of: No.,  NA  Report needed: No. Victim of Neglect:No. Perpetrator of  None   Witness / Exposure to Domestic Violence: No   Protective Services Involvement: No  Witness to [employer] Violence:  No   Family History:  Family History  Problem Relation Age of Onset   Cancer Other    Diabetes Other    CAD Brother    Leukemia Father     Pancreatic cancer Brother    CAD Mother    Breast cancer Sister    Mesothelioma Brother    Colon cancer Neg Hx    Hypercalcemia Neg Hx     Living situation: the patient lives alone in an assisted living facility  Sexual Orientation: Straight  Relationship Status: widowed  Name of spouse / other: Ovid Curd If a parent, number of children / ages: 2 adult daughters  Support Systems: daughter, [name], and patient talks to her sister daily  Financial Stress:  No   Income/Employment/Disability: Actor: No   Educational History: Education: some college  Religion/Sprituality/World View: methodist  Any cultural differences that may affect / interfere with treatment:  none  Recreation/Hobbies: Patient reported golf, reading, crossword and jigsaw puzzles, go for walks daily.  Daughter reported patient has a friend she eats with and goes on walks with after meals, plays corn hole, and other activities at [place]: Loss of husband    Strengths: Family  Barriers:  Patient reported she isn't happy where she currently lives and wants to return to her home.    Legal History: Pending legal issue / charges: The patient has no significant history of legal issues. History of legal issue / charges:  none  Medical History/Surgical History: reviewed Past Medical History:  Diagnosis Date   Anxiety    AR (allergic rhinitis)    Atrial fibrillation ([place])    Basal cell carcinoma of ala nasi 2014   Benign neoplasm stomach body    Cataract    corrected with surgery   Colitis    CTS (carpal tunnel syndrome)    Diverticulitis    Diverticulosis    Esophageal stricture    Frequent headaches    likely migraine   GERD (gastroesophageal reflux disease)    Hepatic steatosis    Hyperlipidemia    Hypertension    IBS (irritable bowel syndrome)    Insomnia    Internal hemorrhoids    Osteopenia   Daughter reported patient does not have a  history of colitis  Past Surgical History:  Procedure Laterality Date   CATARACT EXTRACTION, BILATERAL     August 2018, October 2018   CHOLECYSTECTOMY     MOHS SURGERY  2014   nose    Medications: Current Outpatient Medications  Medication Sig Dispense Refill   acetaminophen (TYLENOL) 325 MG tablet Take 650 mg by mouth every 4 (four) hours as needed.     butalbital-acetaminophen-caffeine (FIORICET) 50-325-40 MG tablet TAKE 1 TABLET BY MOUTH 2 TIMES DAILY AS NEEDED. 100 tablet 1   carbamide peroxide (DEBROX) 6.5 % OTIC solution Place 5 drops into both ears as needed. 15 mL 0   cinacalcet (SENSIPAR) 30 MG tablet Take 1 tablet (30 mg total) by mouth 3 (three) times a week. 30 tablet 2   dicyclomine (BENTYL) 10 MG capsule TAKE 1 CAPSULE BY MOUTH UP TO 4 TIMES A DAY AS NEEDED FOR ABDOMINAL CRAMPS 100 capsule 1   diphenhydrAMINE (BENADRYL ALLERGY) 25 MG tablet Take 1 tablet (25 mg total) by mouth every 6 (six)  hours as needed. 30 tablet 0   ELIQUIS 2.5 MG TABS tablet TAKE 1 TABLET BY MOUTH TWICE A DAY 60 tablet 5   ezetimibe (ZETIA) 10 MG tablet TAKE 1 TABLET BY MOUTH DAILY 90 tablet 1   magnesium hydroxide (MILK OF MAGNESIA) 400 MG/5ML suspension Take 5 mLs by mouth as needed for mild constipation. 355 mL 0   meclizine (ANTIVERT) 25 MG tablet Take 25 mg by mouth 3 (three) times daily as needed. For vertigo     melatonin 5 MG TABS Take 5 mg by mouth.     Multiple Vitamin (MULTIVITAMIN) tablet Take 1 tablet by mouth daily.     rosuvastatin (CRESTOR) 5 MG tablet TAKE 1/2 TABLET BY MOUTH 5 DAYS A WEEK 30 tablet 1   No current facility-administered medications for this visit.    Allergies  Allergen Reactions   Aspirin    Ciprofloxacin    Corticosteroids     Presumed cause of rash/itching.  Do not use prednisone   Nexium [Esomeprazole Magnesium]    Niacin And Related    Propylene Glycol    Vagifem [Estradiol]    Allegra [Fexofenadine]    Bacitracin-Polymyxin B    Ceclor [Cefaclor]     Celebrex [Celecoxib]    Doxepin Hcl    Erythromycin    Flagyl [Metronidazole]    Hydrocortisone Other (See Comments)    After use of 1% HC cream, pt reported burning and feeling "scalded" in vaginal area.   Levofloxacin    Levsin [Hyoscyamine Sulfate]    Lidocaine Other (See Comments)    Unknown reaction   Macrobid [Nitrofurantoin Macrocrystal]    Marcaine [Bupivacaine Hcl]    Nsaids    Penicillins    Prevacid [Lansoprazole]     Able to tolerate omeprazole.    Pseudoephedrine    Sulfonamide Derivatives    Tavist [Clemastine]    Cortisone Rash    Any hydrocortisone cream component with severe vaginal burning    Diagnoses:  Adjustment Disorder with depressed mood R/O Major Depressive Disorder  Plan of Care: Patient is a 88 year old female who presented for an initial assessment and was accompanied by her daughter, Mervin Kung. Patient provided verbal consent for daughter to participate in session. Daughter reported patient was seen by her primary care physician, Dr. Damita Dunnings, 1 week ago and was referred for today's appointment. Daughter reported patient's husband of 60 years passed away in 02-27-2021 and in May 2023 patient moved into an assisted living facility. Patient reported she does not like where she currently lives and wants to return home. Daughter reported patient is unable to return to her home due to changes in memory. Daughter reported patient has made multiple suicidal statements and has vocalized plans to shoot herself or overdose. Patient stated "I don't care to be here, if the lord takes me today that's fine". Clinician inquired further about patient's meaning when she stated, "I don't care to be here, if the lord takes me today that's fine" and patient stated,"I don't want to kill myself, but I don't care about being here, if the Reita Cliche takes me today I'd be happy". Patient denied current suicidal plan or intent. Patient reported no history of suicide attempts. Patient  denied current and past homicidal ideation. Patient denied current and past symptoms of psychosis. Patient identified her daughter, [name], as a support. Daughter reported CNAs and EMS staff are available at the facility 24/7 for support. Clinician discussed with patient and patient's daughter a safety plan  and patient agreed to call her daughter, [name], or press her life alert button if she begins to experience suicidal ideation with plan and/or intent. Daughter reported facility CNA's and other staff will respond when the life alert button is utilized. Daughter and patient reported no weapons in patient's home. Patient stated, "I've never really thought about doing that" when discussing suicidal ideation. It is recommended patient be referred to a psychiatrist for a medication management consult and recommended patient participate in individual therapy. Clinician will review recommendations and treatment plan with patient during follow up appointment.      Katherina Right, LCSW

## 2021-09-15 NOTE — Progress Notes (Signed)
                Anagha Loseke, LCSW 

## 2021-09-20 DIAGNOSIS — H52203 Unspecified astigmatism, bilateral: Secondary | ICD-10-CM | POA: Diagnosis not present

## 2021-09-20 DIAGNOSIS — Z961 Presence of intraocular lens: Secondary | ICD-10-CM | POA: Diagnosis not present

## 2021-09-22 ENCOUNTER — Ambulatory Visit (INDEPENDENT_AMBULATORY_CARE_PROVIDER_SITE_OTHER): Payer: Medicare Other | Admitting: Clinical

## 2021-09-22 DIAGNOSIS — F4321 Adjustment disorder with depressed mood: Secondary | ICD-10-CM

## 2021-09-22 NOTE — Progress Notes (Addendum)
   [facility] Counselor/Therapist Progress Note  Patient ID: [name], MRN: [id number]    Date: 09/22/21  Time Spent: 8:33  am - 9:36 am : 81 Minutes  Treatment Type: Family Therapy Session  Reported Symptoms: decline in memory, tearful, depressed mood  Mental Status Exam: Appearance:  Neat     Behavior: Appropriate  Motor: Normal  Speech/Language:  Clear and Coherent  Affect: Tearful  Mood: depressed and sad  Thought process: normal  Thought content:   WNL  Sensory/Perceptual disturbances:   WNL  Orientation: oriented to person  Attention: Good  Concentration: Good  Memory: Patient deferred to daughter frequently  Fund of knowledge:  Good  Insight:   Poor  Judgment:  Fair  Impulse Control: Good   Risk Assessment: Danger to Self:  No, Patient stated, "I don't really care about being here". It was reported patient has expressed these feelings for 2 years. Patient denied current suicidal ideation with plan or intent.  Self-injurious Behavior: No Danger to Others: No Duty to Warn:no Physical Aggression / Violence:No  Access to Firearms a concern: No  Gang Involvement:No   Subjective:  Patient presented for today's session with her daughter, [name]. Patient provided verbal consent for daughter, [name], to participate in today's session.   Patient stated, "I'm just here". It was reported patient was the primary caregiver for husband for several years prior to his death. Patient reported feeling tired when clinician inquired about potential triggers when symptoms started 2 years ago. Patient's daughter reported patient was tired and caregiving impacted patient's health. Patient stated, "my memory is not getting any better". Daughter reported patient has exhibited decline in short term memory, such as, date/times, directions to familiar places, conversations. Daughter reported patient repeats questions and experienced difficulty managing medications when she  lived at home.  Patient reported she did not have to utilize safety plan since last session. Daughter reported patient exhibits frustration when she can't recall why she lives at [facility]. Patient reported anger towards her sister for not living with patient so she could stay in her home. Patient reported she visited her cousin that lives in her community several times since last session. Patient reported she doesn't want to take medications due to her history of allergic reactions to medications. Patient reported she is open to participation in therapy. Patient declined recommendation for grief/loss support group. Patient reported she does not want to share her feelings in a group setting.    Interventions: Clinician reviewed diagnosis and recommendations with patient and daughter. Clinician provided psycho education regarding diagnosis and treatment recommendations. Clinician discussed recommendation for grief/loss support group. Clinician provided psycho education regarding changes in memory and normal age related changes. Patient deferred to daughter to answer questions/provide feedback. Clinician reviewed safety plan with patient. Clinician will review treatment goals with patient during follow up appointment.  Diagnosis:  Adjustment Disorder with depressed mood   Plan: Clinician will review treatment goals with patient during follow up appointment.     Katherina Right, LCSW

## 2021-10-04 DIAGNOSIS — I7091 Generalized atherosclerosis: Secondary | ICD-10-CM | POA: Diagnosis not present

## 2021-10-04 DIAGNOSIS — B351 Tinea unguium: Secondary | ICD-10-CM | POA: Diagnosis not present

## 2021-10-05 ENCOUNTER — Ambulatory Visit: Payer: Medicare Other | Admitting: Clinical

## 2021-10-12 ENCOUNTER — Ambulatory Visit (INDEPENDENT_AMBULATORY_CARE_PROVIDER_SITE_OTHER): Payer: Medicare Other | Admitting: Clinical

## 2021-10-12 DIAGNOSIS — F4321 Adjustment disorder with depressed mood: Secondary | ICD-10-CM

## 2021-10-12 NOTE — Progress Notes (Signed)
[name]  Patient ID: [name], MRN: [id number]    Date: 10/12/21  Time Spent: 9:33  am - 10:36 am : 63 Minutes  Treatment Type: Individual Therapy.  Reported Symptoms:  Patient reported feeling irritable today.   Mental Status Exam: Appearance:  Neat     Behavior: Appropriate  Motor: Normal  Speech/Language:  Clear and Coherent  Affect: Tearful  Mood: irritable and sad  Thought process: normal  Thought content:   Rumination  Sensory/Perceptual disturbances:   WNL  Orientation: oriented to person, place, and time/date  Attention: Good  Concentration: Good  Memory: Patient reported decline in memory  Fund of knowledge:  Good  Insight:   Poor  Judgment:  Fair  Impulse Control: Good   Risk Assessment: Danger to Self:  No. Patient denied current suicidal ideation, plan, or intent, but stated, "if the lord takes me tomorrow I'd happy". Self-injurious Behavior: No Danger to Others: No Patient denied current homicidal ideation Duty to Warn:no Physical Aggression / Violence:No  Access to Firearms a concern: No  Gang Involvement:No   Subjective:  Patient reported she is not happy in her current living environment and reported she is upset that she can not return to her home. Patient reported she is upset that she is paying to live in a long term care community when she still owns her home. Patient stated, "I have to deal with what I have to deal with" in reference to her current living environment. Patient reported she prefers to be in her home and is upset that her sister won't move in with patient so that she can return to her home. Patient stated, "my memory is not good and not getting any better".  When discussing potential goals for therapy patient stated, "I'm here and I don't care either way". Patient reported she eats lunch with another resident daily and visits her cousin who lives in the same community. Patient  stated, "I don't expect to ever really be happy again". Patient reported she is not open to a referral to a psychiatrist due to history of reactions to medications. Daughter reported the [employer] for [employer] met with patient recently. Patient agreed to previous safety plan to call her daughter or press medical alert button if she begins to experience suicidal ideation. Daughter reported patient frequently vocalizes her frustration regarding her sister's decision to not move in with patient and recently hung the phone up on sister during a conversation. Daughter reported patient's sister moving in with patient in her home is not an option and reported patient experiences difficulty accepting that decision.   Interventions: Cognitive Behavioral Therapy and Motivational Interviewing. Clinician met with patient for the majority of the session. Clinician assessed for safety and reviewed safety plan with patient. Clinician provided psycho education related to depressive symptoms, therapy, and importance of establishing goals for treatment. Reviewed recommendation for referral to a psychiatrist for a medication management consult and discussed patient's concerns regarding medications. Discussed patient's motivation for treatment. Patient provided verbal consent for daughter to join patient and clinician at the end of the session. Discussed with patient and daughter consent for release of information for Director of Social Work at [employer]. Patient experienced difficulty identifying goals for treatment.   Diagnosis:  Adjustment disorder with depressed mood   Plan: Clinician will continue to work with patient to establish goals for therapy during follow up appointment.  Katherina Right, LCSW

## 2021-10-24 ENCOUNTER — Encounter: Payer: Self-pay | Admitting: Family Medicine

## 2021-10-24 ENCOUNTER — Ambulatory Visit (INDEPENDENT_AMBULATORY_CARE_PROVIDER_SITE_OTHER): Payer: Medicare Other | Admitting: Family Medicine

## 2021-10-24 VITALS — BP 120/78 | HR 73 | Temp 97.7°F | Ht 60.0 in | Wt 142.0 lb

## 2021-10-24 DIAGNOSIS — E213 Hyperparathyroidism, unspecified: Secondary | ICD-10-CM | POA: Diagnosis not present

## 2021-10-24 DIAGNOSIS — F4321 Adjustment disorder with depressed mood: Secondary | ICD-10-CM | POA: Diagnosis not present

## 2021-10-24 DIAGNOSIS — Z23 Encounter for immunization: Secondary | ICD-10-CM

## 2021-10-24 DIAGNOSIS — R21 Rash and other nonspecific skin eruption: Secondary | ICD-10-CM | POA: Diagnosis not present

## 2021-10-24 LAB — VITAMIN D 25 HYDROXY (VIT D DEFICIENCY, FRACTURES): VITD: 42.87 ng/mL (ref 30.00–100.00)

## 2021-10-24 LAB — BASIC METABOLIC PANEL
BUN: 9 mg/dL (ref 6–23)
CO2: 27 mEq/L (ref 19–32)
Calcium: 10.7 mg/dL — ABNORMAL HIGH (ref 8.4–10.5)
Chloride: 103 mEq/L (ref 96–112)
Creatinine, Ser: 0.83 mg/dL (ref 0.40–1.20)
GFR: 63.53 mL/min (ref >60.00)
Glucose, Bld: 101 mg/dL — ABNORMAL HIGH (ref 70–99)
Potassium: 4.5 mEq/L (ref 3.5–5.1)
Sodium: 139 mEq/L (ref 135–145)

## 2021-10-24 MED ORDER — DIPHENHYDRAMINE HCL 2 % EX CREA: TOPICAL_CREAM | Freq: Three times a day (TID) | CUTANEOUS | 0 refills | Status: AC | PRN

## 2021-10-24 NOTE — Progress Notes (Signed)
Rash.  She has had episodic itchy spots on the lower shins/ankles.  D/w pt about options.  Didn't tolerate hydrocortisone cream prev.  Unclear if insect bites.  No sx now.  No FCNAVD.    Mood is still low.  She is still walking and active, in activities at [facility].  She tells me that she would prefer to be "in heaven" with her husband.  We discussed the fact that treating her grief with a combination of activity, counseling, and medication would be reasonable.  She is not enthused about taking extra medication right now.  History of hypercalcemia/hyperparathyroidism on Cinacalcet with follow-up labs pending.  Meds, vitals, and allergies reviewed.   ROS: Per HPI unless specifically indicated in ROS section   GEN: nad, alert, tearful but regains composure. HEENT:  NECK: supple w/o LA CV: rrr. PULM: ctab, no inc wob ABD: soft, +bs EXT: no edema SKIN: Well-perfused.  30 minutes were devoted to patient care in this encounter (this includes time spent reviewing the patient's file/history, interviewing and examining the patient, counseling/reviewing plan with patient).

## 2021-10-24 NOTE — Patient Instructions (Addendum)
Try benadryl cream if needed for itching. Let me know if that doesn't help or isn't tolerable.   Take care.  Glad to see you. Go to the lab on the way out.   If you have mychart we'll likely use that to update you.

## 2021-10-25 LAB — PTH, INTACT AND CALCIUM
Calcium: 10.5 mg/dL — ABNORMAL HIGH (ref 8.6–10.4)
PTH: 83 pg/mL — ABNORMAL HIGH (ref 16–77)

## 2021-10-26 ENCOUNTER — Ambulatory Visit (INDEPENDENT_AMBULATORY_CARE_PROVIDER_SITE_OTHER): Payer: Medicare Other | Admitting: Clinical

## 2021-10-26 DIAGNOSIS — F4321 Adjustment disorder with depressed mood: Secondary | ICD-10-CM | POA: Diagnosis not present

## 2021-10-26 NOTE — Progress Notes (Signed)
[place] Counselor/Therapist Progress Note  Patient ID: [name], MRN: [id number]    Date: 10/26/21  Time Spent: 10:37  am - 11:22 am : 45 Minutes  Treatment Type: Individual Therapy.  Reported Symptoms: Patient reported sadness  Mental Status Exam: Appearance:  Neat     Behavior: Appropriate  Motor: Normal  Speech/Language:  Clear and Coherent  Affect: Tearful  Mood: sad  Thought process: normal  Thought content:   WNL  Sensory/Perceptual disturbances:   WNL  Orientation: oriented to person and place  Attention: Good  Concentration: Good  Memory: WNL  Fund of knowledge:  Good  Insight:   Poor  Judgment:  Fair  Impulse Control: Good   Risk Assessment: Danger to Self:  No Patient stated, "I would just rather be gone" but denied thought, plan, or intent to harm herself.  Self-injurious Behavior: No Danger to Others: No Duty to Warn:no Physical Aggression / Violence:No  Access to Firearms a concern: No  Gang Involvement:No   Subjective:  Patient stated, "It's going" and "I'm still not happy". Patient reported she wants to be in her home. Patient reported she tries to participate in activities the long term care community offers, such as, a recent trip [place]. Patient reported she [place] but reported it was a long day. Patient stated, "I would just rather be gone" and stated, "I tell the lord every night Im ready". Patient stated, "I  guess the lord keeps me here" and identified family as a protective factor. Patient agreed to safety plan. Patient stated, "my husband's gone so I'd just soon be gone too" and stated, "I just don't really care". Patient reported she tries to participate in activities she will enjoy. Patient identified the following positive aspects of her living environment: stated, "its a very nice place", staff and residents are nice, enjoyed getting a manicure, activities offered, bingo, safe place,  food is good, cousin's wife lives in the community, can visit with cousin's wife, walks with friend, and eats with friend. Patient reported she and husband met in the first grade and started dating junior year of high school. Patient reported they were married 17 years and reported she misses her husband. Patient agreed to hang list of positive aspects about her living environment in her apartment.   Interventions: Cognitive Behavioral Therapy and Motivational Interviewing. Reviewed safety plan and identified protective factors. Explored, identified, and made a list of positive aspects about patient's current living environment. Processed grief related to the loss of patient's husband. Clinician utilized motivational interviewing to explore potential goals for therapy. Clinician utilized a task centered approach in collaboration with patient to develop goals for therapy.   Diagnosis:  Adjustment disorder with depressed mood   Plan: Patient is to utilize [facility], thought re-framing, positive self talk, and coping strategies to decrease symptoms associated with Adjustment Disorder with depressed mood. Frequency: weekly  Modality: individual     Long-term goal:   Reduce overall level, frequency, and intensity of depressive symptoms as evidenced by decreased sadness, crying, loss of interest, irritability, negative self talk, suicidal ideation, and change in appetite from 6 to 7 days per week to 0 to 1 days per week per patient's report Target Date: 10/27/22  Progress: progressing   Short-term goal:  Identify positive aspects and relationships in patient's life Target Date: 04/27/22  Progress: progressing   Patient will report suicidal ideation to daughter or long term care staff Target Date:  04/27/22  Progress: progressing   re-establish a sense of hope for life and self Target Date: 04/27/22  Progress: progressing   identify and replace negative thinking patterns that  contribute to feelings of hopelessness with positive thoughts/beliefs Target Date: 04/27/22  Progress: progressing                  Katherina Right, LCSW

## 2021-10-27 NOTE — Assessment & Plan Note (Signed)
Encouraged her to try medication.  She was noncommittal at this point.  Continue with counseling.  Continue with activity at [facility] [place].  She will update me as needed.  At this point still okay for outpatient follow-up.

## 2021-10-27 NOTE — Assessment & Plan Note (Signed)
Continue Cinacalcet.  See notes on follow-up labs.

## 2021-10-27 NOTE — Assessment & Plan Note (Signed)
Reasonable to use Benadryl cream topically if symptoms return.

## 2021-11-02 ENCOUNTER — Ambulatory Visit (INDEPENDENT_AMBULATORY_CARE_PROVIDER_SITE_OTHER): Payer: Medicare Other | Admitting: Clinical

## 2021-11-02 DIAGNOSIS — F4321 Adjustment disorder with depressed mood: Secondary | ICD-10-CM

## 2021-11-02 NOTE — Progress Notes (Signed)
[place] Counselor/Therapist Progress Note  Patient ID: [name], MRN: [id number],    Date: 11/02/2021  Time Spent: 9:34am - 10:34am : 60 minutes   Treatment Type: Individual Therapy  Reported Symptoms: Patient stated, "I would like to be gone", sadness.   Mental Status Exam: Appearance:  Neat     Behavior: Appropriate  Motor: Normal  Speech/Language:  Clear and Coherent  Affect: Appropriate  Mood: depressed  Thought process: normal  Thought content:   WNL  Sensory/Perceptual disturbances:   WNL  Orientation: oriented to person and situation  Attention: Good  Concentration: Good  Memory: Patient stated, "my memory is not getting any better"  Fund of knowledge:  Good  Insight:   Fair  Judgment:  Good  Impulse Control: Good   Risk Assessment: Danger to Self:  No Patient stated, "I would like to be gone", but denied thoughts/plan/intent to harm herself Self-injurious Behavior: No Danger to Others: No Patient denied current homicidal ideation Duty to Warn:no Physical Aggression / Violence:No  Access to Firearms a concern: No  Gang Involvement:No   Subjective: Patient stated, "they do something all the time but I can't necessarily remember what they did" in regards to activities in the long term care community. Patient  stated, "I really can't remember" when clinician asked about activities since last session.  Patient stated, "I feel that way (sadness) all the time". Patient reported feeling happy when her daughter takes her places, when walking and eating with a friend in her community,  being around others, and visiting with her cousin. Patient stated, "I just don't understand why my sister won't come". Patient stated, "I don't understand why I am spending money living away from my home". Patient reported her sister's decision to not live with patient makes her angry and sad. Patient stated, I would just rather be home in my own house".Patient stated,  "I don't think to look at it" in regards to list of positives aspects about patient's current living environment.  Patient identified prayer as coping mechanism.  Patient identified the following positive qualities/services her current living environment provides: manage medications, changes linens, cleans her room, provides reminders, laundry services, provides activities and transportation, and she feels safe there. Patient reported she wouldn't feel safe at home alone.  Patient stated, "I have always been involved in crafts". Patient's daughter reported patient has shown some improvement in symptoms, has been involved in long term care community activities and has expressed happiness since last session.   Interventions: Cognitive Behavioral Therapy. Explored and identified activities that bring patient joy/happiness. Provided psycho education related to depressive symptoms. Reviewed strategies discussed last session. Explored and identified positive qualities/services patient's current living environment provides to support patient. Patient provided verbal consent for patient's daughter to join at the end of the session to obtain information about activities offered in patient's long term care community and obtain collateral information.     Diagnosis:  Adjustment disorder with depressed mood     Plan: Patient is to utilize [employer] [facility], thought re-framing, positive self talk, and coping strategies to decrease symptoms associated with Adjustment Disorder with depressed mood. Frequency: weekly  Modality: individual      Long-term goal:   Reduce overall level, frequency, and intensity of depressive symptoms as evidenced by decreased sadness, crying, loss of interest, irritability, negative self talk, suicidal ideation, and change in appetite from 6 to 7 days per week to 0 to 1 days per week per patient's report Target Date:  10/27/22  Progress: progressing    Short-term goal:   Identify positive aspects and relationships in patient's life Target Date: 04/27/22  Progress: progressing    Patient will report suicidal ideation to daughter or long term care staff Target Date: 04/27/22  Progress: progressing    re-establish a sense of hope for life and self Target Date: 04/27/22  Progress: progressing    identify and replace negative thinking patterns that contribute to feelings of hopelessness with positive thoughts/beliefs Target Date: 04/27/22  Progress: progressing          Katherina Right, LCSW

## 2021-11-02 NOTE — Progress Notes (Signed)
                Marquel Spoto, LCSW 

## 2021-11-09 ENCOUNTER — Ambulatory Visit (INDEPENDENT_AMBULATORY_CARE_PROVIDER_SITE_OTHER): Payer: Medicare Other | Admitting: Clinical

## 2021-11-09 DIAGNOSIS — F4321 Adjustment disorder with depressed mood: Secondary | ICD-10-CM

## 2021-11-09 NOTE — Progress Notes (Signed)
                [name], LCSW 

## 2021-11-09 NOTE — Progress Notes (Signed)
[place] Counselor/Therapist Progress Note  Patient ID: [name], MRN: [id number],    Date: 11/09/2021  Time Spent: 9:34am - 10:23am   Treatment Type: Individual Therapy  Reported Symptoms: Patient stated, "I just don't want to be here".  Mental Status Exam: Appearance:  Neat     Behavior: Appropriate  Motor: Normal  Speech/Language:  Clear and Coherent  Affect: Tearful  Mood: sad  Thought process: normal  Thought content:   WNL  Sensory/Perceptual disturbances:   WNL  Orientation: oriented to person and situation  Attention: Good  Concentration: Good  Memory: Difficulty recalling recent events, repetitive statements  Fund of knowledge:  Good  Insight:   Fair  Judgment:  Good  Impulse Control: Good   Risk Assessment: Danger to Self:  No Patient stated, "I just don't want to be here" and stated, "I'm ready to go when he's ready for me", but denied thoughts/plan/intent to harm herself Self-injurious Behavior: No Danger to Others: No Patient denied current homicidal ideation Duty to Warn:no Physical Aggression / Violence:No  Access to Firearms a concern: No  Gang Involvement:No   Subjective: Patient reported difficulty recalling recent events since last session. Patient stated, "everyday is the same, its not any fun anymore". Patient reported the friend she walks with regularly prefers to walk inside. Patient reported she would rather go for a walk outside to get fresh air. Patient reported concern about how her friend would feel if she asked another friend to go for a walk.  Patient reported she is open to asking another friend to go for a walk outside. Patient stated, "I don't look at it too often" in response to clinician's inquiry about list of positive aspects of her current living environment.  Patient stated, "I just don't want to be here" and "I'm ready to go when he's (the Lord) ready for me".  Patient reported she has always been able to  drive and have the independence to go where she would like to go. Patient reported the decision for patient to discontinue driving was not her choice. Patient reported a neighbor/friend recently passed away and she reported feeling sad in response. Patient stated, "it hurts me that she's gone".  Patient reported she found out about her friend's death this morning and stated,  "it really shocked me". Patient stated, "I cant believe she's gone".  Patient stated, "I pray a lot" in regards to coping strategies.   Interventions: Cognitive Behavioral Therapy. Discussed patient's daily schedule and strategies to increase participation in activities patient enjoys, such as, asking a friend to go for a walk outside with patient. Explored barriers to looking at list of positive aspects of patient's living environment. Explored the meaning of patients statement, "I just don't want to be here" and patient's feelings in regards to a loss of independence. Processed patient's feelings about her neighbor/friend's death and identified ways in which patient has coped with loss in the past.   Diagnosis:  Adjustment disorder with depressed mood     Plan: Patient is to utilize [employer] [facility], thought re-framing, positive self talk, and coping strategies to decrease symptoms associated with Adjustment Disorder with depressed mood. Frequency: weekly  Modality: individual      Long-term goal:   Reduce overall level, frequency, and intensity of depressive symptoms as evidenced by decreased sadness, crying, loss of interest, irritability, negative self talk, suicidal ideation, and change in appetite from 6 to 7 days per week to 0 to 1 days per week  per patient's report Target Date: 10/27/22  Progress: progressing    Short-term goal:  Identify positive aspects and relationships in patient's life Target Date: 04/27/22  Progress: progressing    Patient will report suicidal ideation to daughter or long term  care staff Target Date: 04/27/22  Progress: progressing    re-establish a sense of hope for life and self Target Date: 04/27/22  Progress: progressing    identify and replace negative thinking patterns that contribute to feelings of hopelessness with positive thoughts/beliefs Target Date: 04/27/22  Progress: progressing      Katherina Right, LCSW

## 2021-11-16 ENCOUNTER — Ambulatory Visit: Payer: Medicare Other | Admitting: Clinical

## 2021-11-22 DIAGNOSIS — Z23 Encounter for immunization: Secondary | ICD-10-CM | POA: Diagnosis not present

## 2021-12-14 ENCOUNTER — Ambulatory Visit: Payer: Medicare Other | Admitting: Clinical

## 2022-01-21 IMAGING — DX DG LUMBAR SPINE COMPLETE 4+V
5 series · 5 of 5 positions shown · non-contrast
Comparison: CT abdomen pelvis dated May 27, 2014.

CLINICAL DATA: Lower back pain.

EXAM:
LUMBAR SPINE - COMPLETE 4+ VIEW

[l-spine ap]
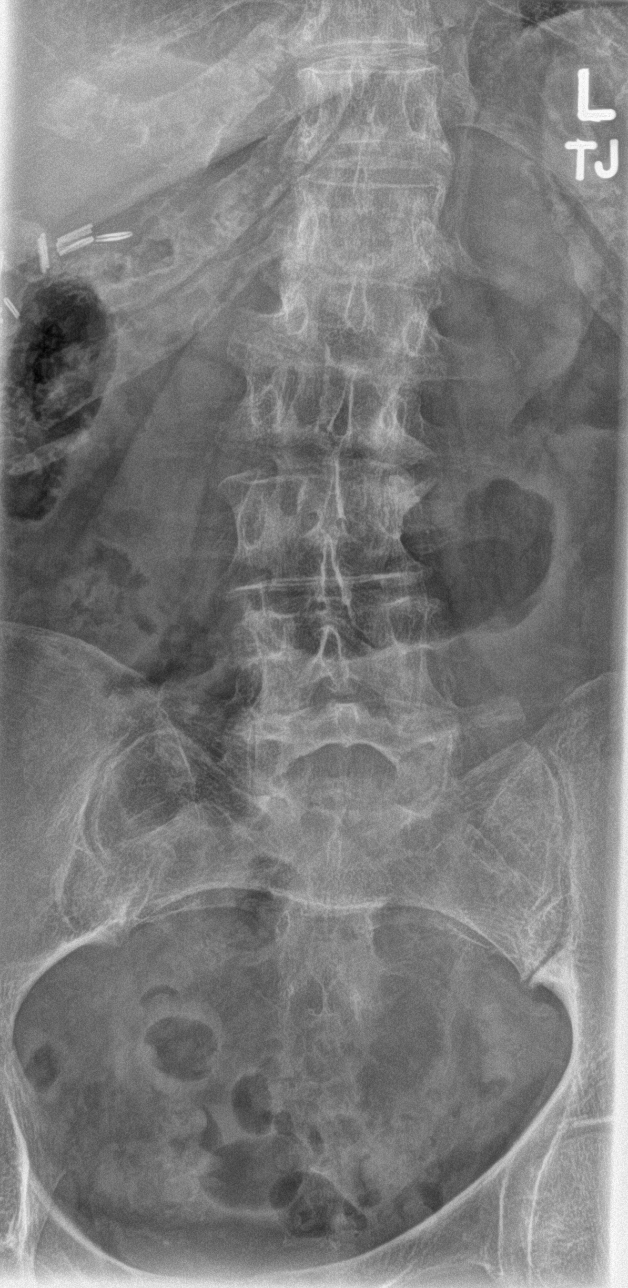

[l-spine obl (1 of 2)]
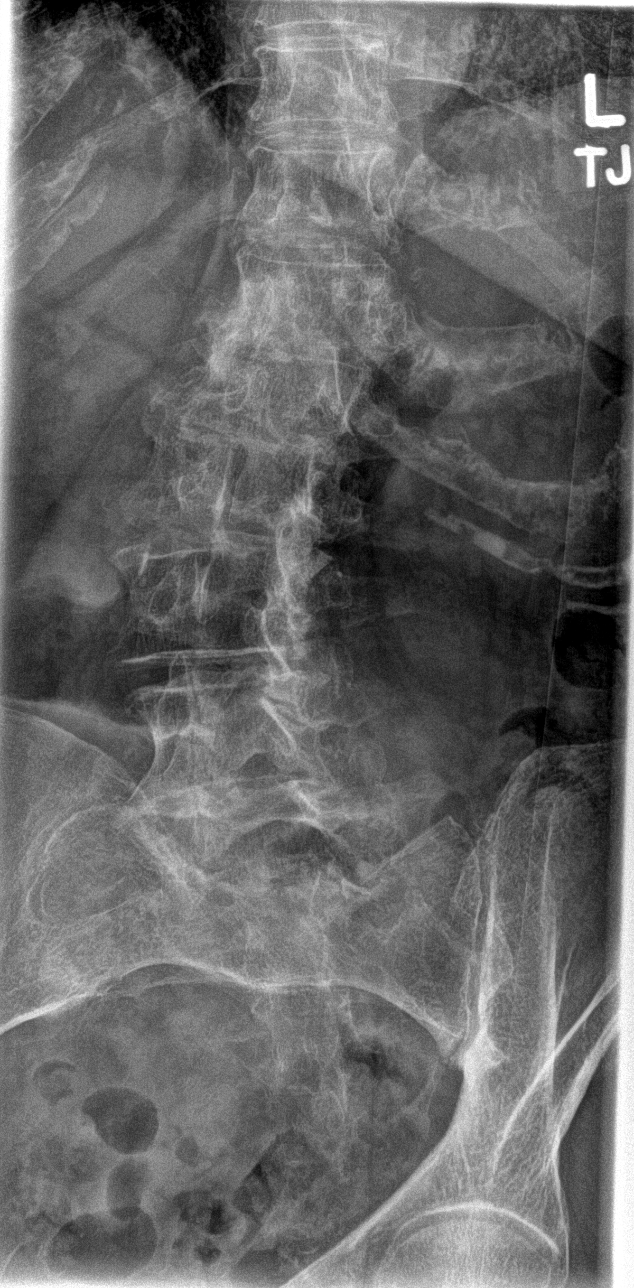

[l-spine obl (2 of 2)]
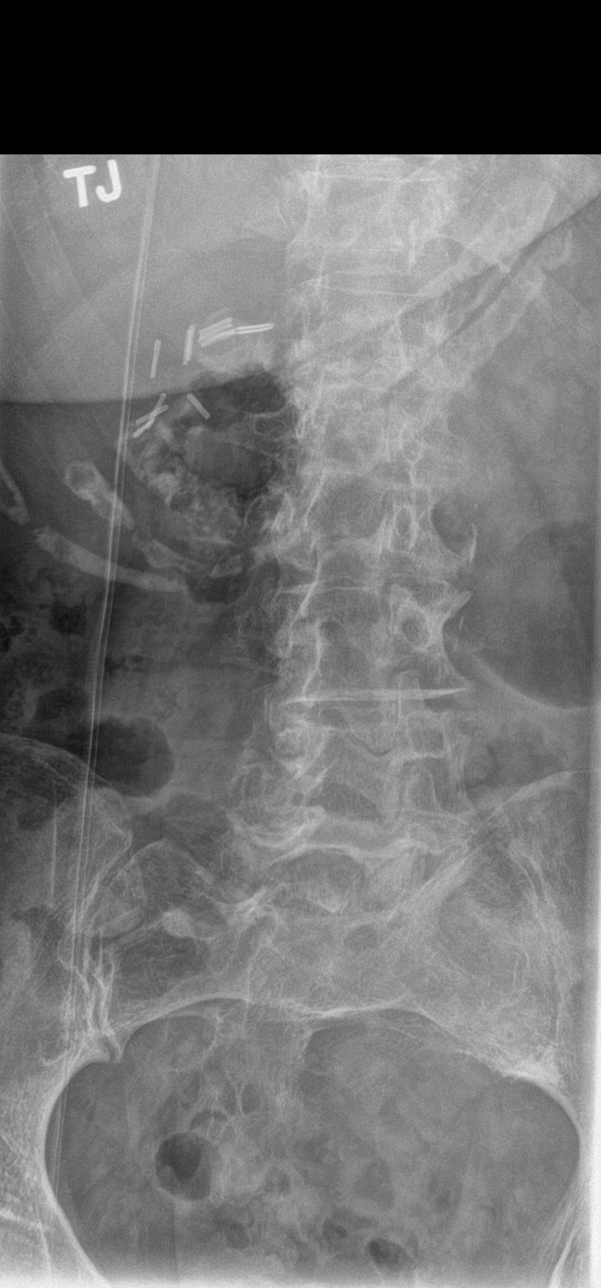

[l-spine lat]
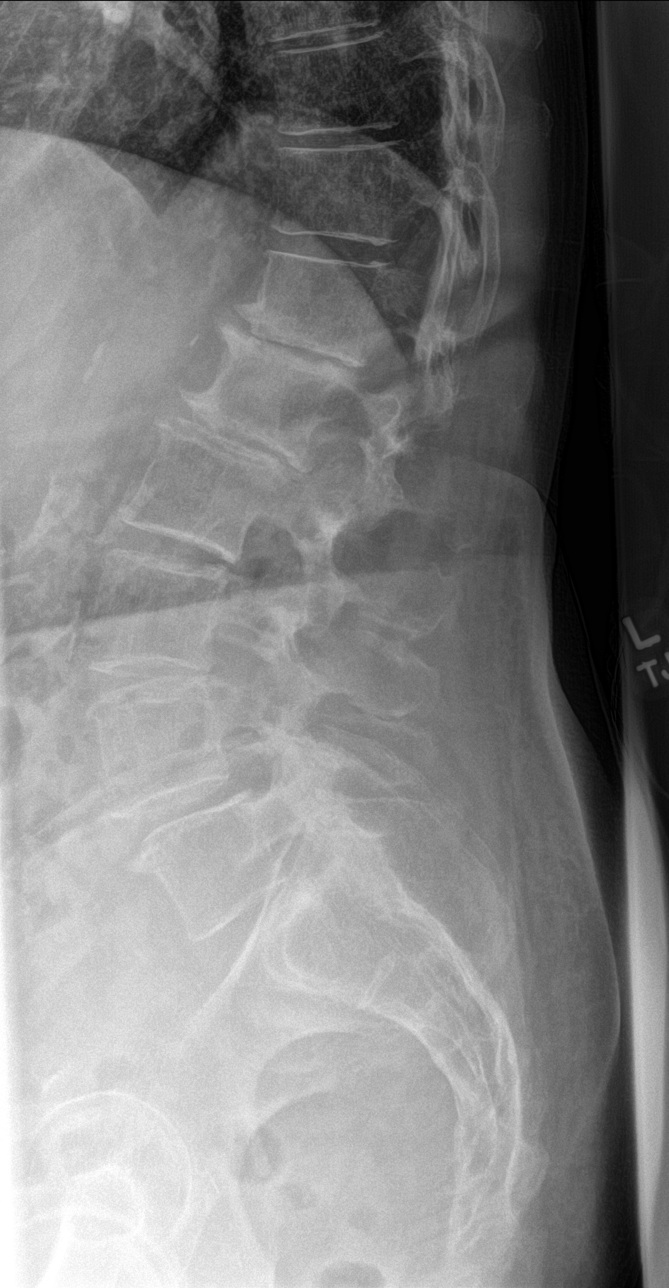

[l-spine l5/s1]
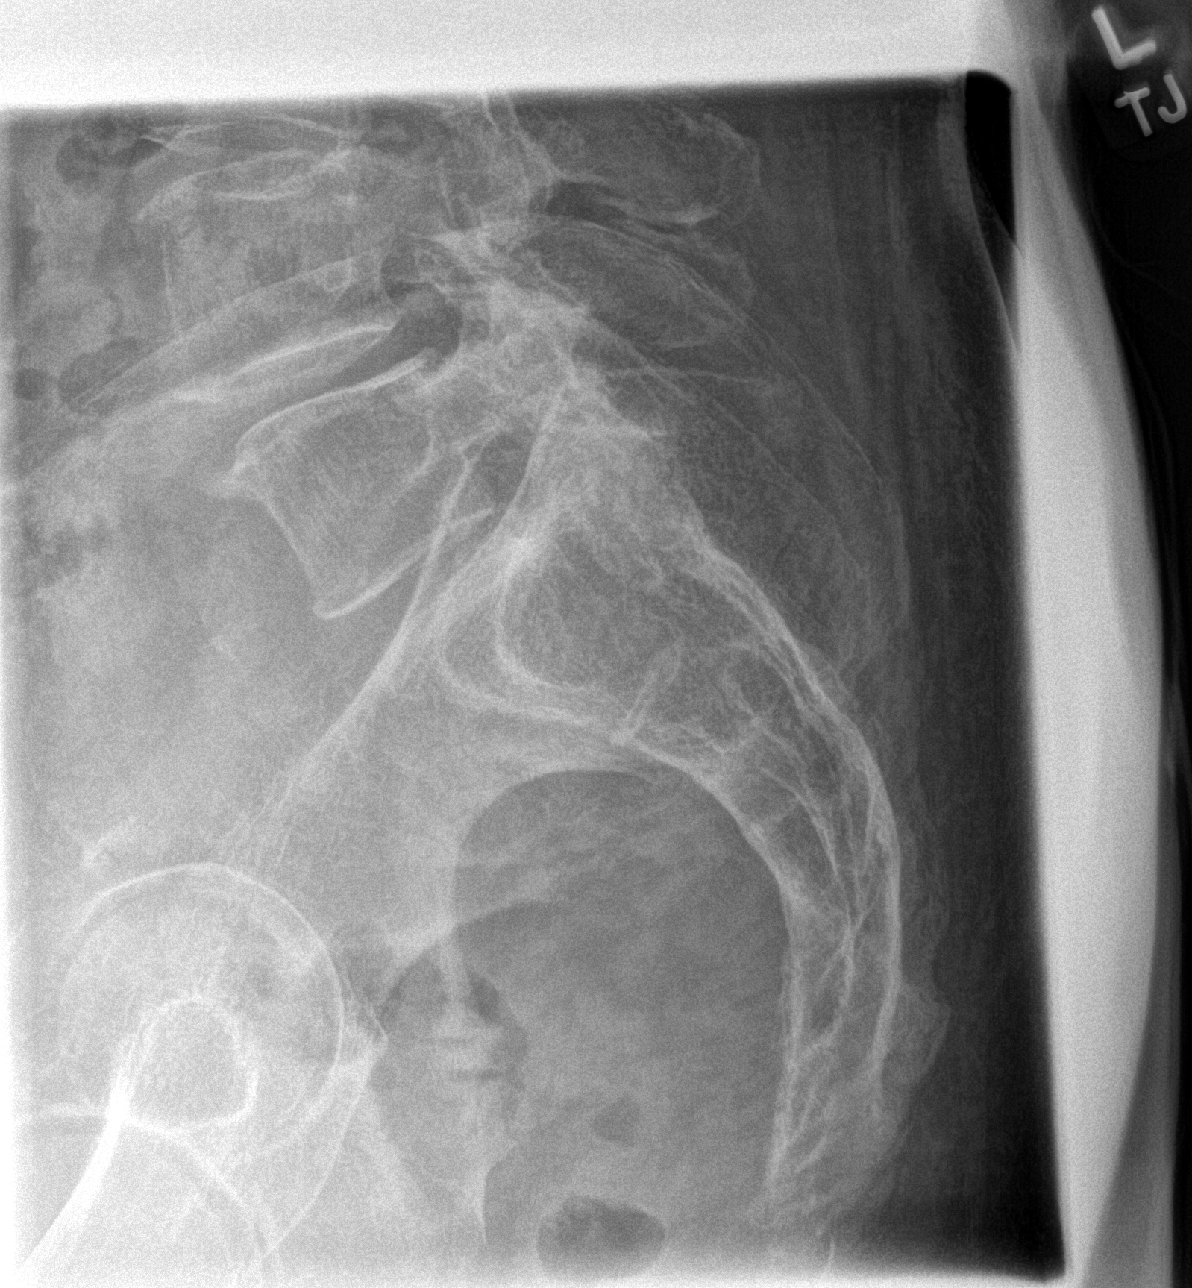

[5 of 5 positions shown; findings below may reference images not displayed]

FINDINGS: Five lumbar type vertebral bodies. No acute fracture or subluxation.
Vertebral body heights are preserved.

New facet mediated 4 mm anterolisthesis at L4-L5. Unchanged 4 mm
retrolisthesis at L1-L2. Progressive moderate to severe disc height
loss at T12-L1 and L1-L2. Unchanged mild disc height loss from L2-L3
through L4-L5.

Mild to moderate lower lumbar facet arthropathy.
IMPRESSION: 1. No acute osseous abnormality.
2. Progressive moderate to severe degenerative disc disease at
T12-L1 and L1-L2.
3. New facet mediated 4 mm anterolisthesis at L4-L5.

## 2022-02-07 ENCOUNTER — Other Ambulatory Visit: Payer: Self-pay

## 2022-02-07 ENCOUNTER — Inpatient Hospital Stay
Admission: EM | Admit: 2022-02-07 | Discharge: 2022-02-10 | DRG: 394 | Disposition: A | Discharge status: Home / Self Care | Payer: Medicare Other | Source: Skilled Nursing Facility | Attending: Student | Admitting: Student

## 2022-02-07 ENCOUNTER — Emergency Department: Payer: Medicare Other

## 2022-02-07 DIAGNOSIS — Z85828 Personal history of other malignant neoplasm of skin: Secondary | ICD-10-CM | POA: Diagnosis not present

## 2022-02-07 DIAGNOSIS — K760 Fatty (change of) liver, not elsewhere classified: Secondary | ICD-10-CM | POA: Diagnosis present

## 2022-02-07 DIAGNOSIS — F0390 Unspecified dementia without behavioral disturbance: Secondary | ICD-10-CM | POA: Diagnosis present

## 2022-02-07 DIAGNOSIS — K922 Gastrointestinal hemorrhage, unspecified: Secondary | ICD-10-CM | POA: Diagnosis not present

## 2022-02-07 DIAGNOSIS — Z9049 Acquired absence of other specified parts of digestive tract: Secondary | ICD-10-CM

## 2022-02-07 DIAGNOSIS — Z79890 Hormone replacement therapy (postmenopausal): Secondary | ICD-10-CM | POA: Diagnosis not present

## 2022-02-07 DIAGNOSIS — K219 Gastro-esophageal reflux disease without esophagitis: Secondary | ICD-10-CM | POA: Diagnosis not present

## 2022-02-07 DIAGNOSIS — M8580 Other specified disorders of bone density and structure, unspecified site: Secondary | ICD-10-CM | POA: Diagnosis present

## 2022-02-07 DIAGNOSIS — K529 Noninfective gastroenteritis and colitis, unspecified: Secondary | ICD-10-CM | POA: Diagnosis not present

## 2022-02-07 DIAGNOSIS — E785 Hyperlipidemia, unspecified: Secondary | ICD-10-CM | POA: Diagnosis present

## 2022-02-07 DIAGNOSIS — K5792 Diverticulitis of intestine, part unspecified, without perforation or abscess without bleeding: Principal | ICD-10-CM

## 2022-02-07 DIAGNOSIS — I340 Nonrheumatic mitral (valve) insufficiency: Secondary | ICD-10-CM | POA: Diagnosis present

## 2022-02-07 DIAGNOSIS — K625 Hemorrhage of anus and rectum: Secondary | ICD-10-CM | POA: Diagnosis not present

## 2022-02-07 DIAGNOSIS — Z79899 Other long term (current) drug therapy: Secondary | ICD-10-CM | POA: Diagnosis not present

## 2022-02-07 DIAGNOSIS — Z881 Allergy status to other antibiotic agents status: Secondary | ICD-10-CM | POA: Diagnosis not present

## 2022-02-07 DIAGNOSIS — Z8249 Family history of ischemic heart disease and other diseases of the circulatory system: Secondary | ICD-10-CM

## 2022-02-07 DIAGNOSIS — Z9841 Cataract extraction status, right eye: Secondary | ICD-10-CM | POA: Diagnosis not present

## 2022-02-07 DIAGNOSIS — I10 Essential (primary) hypertension: Secondary | ICD-10-CM | POA: Diagnosis present

## 2022-02-07 DIAGNOSIS — Z803 Family history of malignant neoplasm of breast: Secondary | ICD-10-CM | POA: Diagnosis not present

## 2022-02-07 DIAGNOSIS — K559 Vascular disorder of intestine, unspecified: Secondary | ICD-10-CM | POA: Diagnosis not present

## 2022-02-07 DIAGNOSIS — R000 Tachycardia, unspecified: Secondary | ICD-10-CM | POA: Diagnosis present

## 2022-02-07 DIAGNOSIS — I480 Paroxysmal atrial fibrillation: Secondary | ICD-10-CM | POA: Diagnosis present

## 2022-02-07 DIAGNOSIS — Z9842 Cataract extraction status, left eye: Secondary | ICD-10-CM | POA: Diagnosis not present

## 2022-02-07 DIAGNOSIS — K921 Melena: Secondary | ICD-10-CM | POA: Diagnosis present

## 2022-02-07 DIAGNOSIS — Z806 Family history of leukemia: Secondary | ICD-10-CM | POA: Diagnosis not present

## 2022-02-07 DIAGNOSIS — Z833 Family history of diabetes mellitus: Secondary | ICD-10-CM

## 2022-02-07 DIAGNOSIS — K589 Irritable bowel syndrome without diarrhea: Secondary | ICD-10-CM | POA: Diagnosis not present

## 2022-02-07 DIAGNOSIS — Z889 Allergy status to unspecified drugs, medicaments and biological substances status: Secondary | ICD-10-CM

## 2022-02-07 DIAGNOSIS — Z7901 Long term (current) use of anticoagulants: Secondary | ICD-10-CM | POA: Diagnosis not present

## 2022-02-07 DIAGNOSIS — E210 Primary hyperparathyroidism: Secondary | ICD-10-CM | POA: Diagnosis present

## 2022-02-07 DIAGNOSIS — Z8719 Personal history of other diseases of the digestive system: Secondary | ICD-10-CM

## 2022-02-07 DIAGNOSIS — K5730 Diverticulosis of large intestine without perforation or abscess without bleeding: Secondary | ICD-10-CM | POA: Diagnosis present

## 2022-02-07 DIAGNOSIS — F419 Anxiety disorder, unspecified: Secondary | ICD-10-CM | POA: Diagnosis present

## 2022-02-07 DIAGNOSIS — Z800 Family history of malignant neoplasm of digestive organs: Secondary | ICD-10-CM | POA: Diagnosis not present

## 2022-02-07 DIAGNOSIS — K6389 Other specified diseases of intestine: Secondary | ICD-10-CM | POA: Diagnosis not present

## 2022-02-07 DIAGNOSIS — I4891 Unspecified atrial fibrillation: Secondary | ICD-10-CM | POA: Diagnosis present

## 2022-02-07 LAB — CBC WITH DIFFERENTIAL/PLATELET
Abs Immature Granulocytes: 0.07 10*3/uL (ref 0.00–0.07)
Basophils Absolute: 0 10*3/uL (ref 0.0–0.1)
Basophils Relative: 0 %
Eosinophils Absolute: 0 10*3/uL (ref 0.0–0.5)
Eosinophils Relative: 0 %
HCT: 44.2 % (ref 36.0–46.0)
Hemoglobin: 14.5 g/dL (ref 12.0–15.0)
Immature Granulocytes: 1 %
Lymphocytes Relative: 6 %
Lymphs Abs: 0.9 10*3/uL (ref 0.7–4.0)
MCH: 32.9 pg (ref 26.0–34.0)
MCHC: 32.8 g/dL (ref 30.0–36.0)
MCV: 100.2 fL — ABNORMAL HIGH (ref 80.0–100.0)
Monocytes Absolute: 0.9 10*3/uL (ref 0.1–1.0)
Monocytes Relative: 6 %
Neutro Abs: 13.5 10*3/uL — ABNORMAL HIGH (ref 1.7–7.7)
Neutrophils Relative %: 87 %
Platelets: 238 10*3/uL (ref 150–400)
RBC: 4.41 MIL/uL (ref 3.87–5.11)
RDW: 12.2 % (ref 11.5–15.5)
WBC: 15.4 10*3/uL — ABNORMAL HIGH (ref 4.0–10.5)
nRBC: 0 % (ref 0.0–0.2)

## 2022-02-07 LAB — COMPREHENSIVE METABOLIC PANEL
ALT: 16 U/L (ref 0–44)
AST: 33 U/L (ref 15–41)
Albumin: 4 g/dL (ref 3.5–5.0)
Alkaline Phosphatase: 95 U/L (ref 38–126)
Anion gap: 8 (ref 5–15)
BUN: 14 mg/dL (ref 8–23)
CO2: 25 mmol/L (ref 22–32)
Calcium: 10.4 mg/dL — ABNORMAL HIGH (ref 8.9–10.3)
Chloride: 103 mmol/L (ref 98–111)
Creatinine, Ser: 1 mg/dL (ref 0.44–1.00)
GFR, Estimated: 55 mL/min — ABNORMAL LOW (ref >60)
Glucose, Bld: 126 mg/dL — ABNORMAL HIGH (ref 70–99)
Potassium: 4.8 mmol/L (ref 3.5–5.1)
Sodium: 136 mmol/L (ref 135–145)
Total Bilirubin: 1.3 mg/dL — ABNORMAL HIGH (ref 0.3–1.2)
Total Protein: 7.6 g/dL (ref 6.5–8.1)

## 2022-02-07 LAB — URINALYSIS, ROUTINE W REFLEX MICROSCOPIC
Bacteria, UA: NONE SEEN
Bilirubin Urine: NEGATIVE
Glucose, UA: NEGATIVE mg/dL
Ketones, ur: NEGATIVE mg/dL
Leukocytes,Ua: NEGATIVE
Nitrite: NEGATIVE
Protein, ur: NEGATIVE mg/dL
Specific Gravity, Urine: 1.038 — ABNORMAL HIGH (ref 1.005–1.030)
pH: 6 (ref 5.0–8.0)

## 2022-02-07 LAB — LIPASE, BLOOD: Lipase: 35 U/L (ref 11–51)

## 2022-02-07 MED ORDER — ACETAMINOPHEN 325 MG PO TABS
650.0000 mg | ORAL_TABLET | Freq: Four times a day (QID) | ORAL | Status: DC | PRN
  Administered 2022-02-10: 650 mg via ORAL
  Filled 2022-02-07: qty 2

## 2022-02-07 MED ORDER — METRONIDAZOLE 500 MG/100ML IV SOLN: 500.0000 mg | Freq: Two times a day (BID) | INTRAVENOUS | Status: DC

## 2022-02-07 MED ORDER — ONDANSETRON HCL 4 MG/2ML IJ SOLN
4.0000 mg | Freq: Once | INTRAMUSCULAR | Status: AC
  Administered 2022-02-07: 4 mg via INTRAVENOUS
  Filled 2022-02-07: qty 2

## 2022-02-07 MED ORDER — SODIUM CHLORIDE 0.9 % IV SOLN
1.0000 g | Freq: Two times a day (BID) | INTRAVENOUS | Status: DC
  Administered 2022-02-07 – 2022-02-09 (×4): 1 g via INTRAVENOUS
  Filled 2022-02-07 (×2): qty 1
  Filled 2022-02-07 (×3): qty 20

## 2022-02-07 MED ORDER — CINACALCET HCL 30 MG PO TABS
30.0000 mg | ORAL_TABLET | ORAL | Status: DC
  Administered 2022-02-08: 30 mg via ORAL
  Filled 2022-02-07 (×2): qty 1

## 2022-02-07 MED ORDER — CIPROFLOXACIN IN D5W 400 MG/200ML IV SOLN
400.0000 mg | Freq: Once | INTRAVENOUS | Status: AC
  Administered 2022-02-07: 400 mg via INTRAVENOUS
  Filled 2022-02-07: qty 200

## 2022-02-07 MED ORDER — IOHEXOL 300 MG/ML  SOLN
100.0000 mL | Freq: Once | INTRAMUSCULAR | Status: AC | PRN
  Administered 2022-02-07: 100 mL via INTRAVENOUS

## 2022-02-07 MED ORDER — LACTATED RINGERS IV BOLUS
1000.0000 mL | Freq: Once | INTRAVENOUS | Status: AC
  Administered 2022-02-07: 1000 mL via INTRAVENOUS

## 2022-02-07 MED ORDER — ACETAMINOPHEN 650 MG RE SUPP: 650.0000 mg | Freq: Four times a day (QID) | RECTAL | Status: DC | PRN

## 2022-02-07 MED ORDER — ONDANSETRON HCL 4 MG PO TABS
4.0000 mg | ORAL_TABLET | Freq: Four times a day (QID) | ORAL | Status: DC | PRN
  Administered 2022-02-10: 4 mg via ORAL
  Filled 2022-02-07: qty 1

## 2022-02-07 MED ORDER — LACTATED RINGERS IV SOLN: INTRAVENOUS | Status: AC

## 2022-02-07 MED ORDER — METRONIDAZOLE 500 MG/100ML IV SOLN
500.0000 mg | Freq: Once | INTRAVENOUS | Status: AC
  Administered 2022-02-07: 500 mg via INTRAVENOUS
  Filled 2022-02-07: qty 100

## 2022-02-07 MED ORDER — CIPROFLOXACIN IN D5W 400 MG/200ML IV SOLN
400.0000 mg | Freq: Two times a day (BID) | INTRAVENOUS | Status: DC
  Filled 2022-02-07: qty 200

## 2022-02-07 MED ORDER — MORPHINE SULFATE (PF) 2 MG/ML IV SOLN: 2.0000 mg | INTRAVENOUS | Status: DC | PRN

## 2022-02-07 MED ORDER — HYDROCODONE-ACETAMINOPHEN 5-325 MG PO TABS
1.0000 | ORAL_TABLET | ORAL | Status: DC | PRN
  Administered 2022-02-07: 1 via ORAL
  Administered 2022-02-08 – 2022-02-10 (×7): 2 via ORAL
  Filled 2022-02-07: qty 2
  Filled 2022-02-07: qty 1
  Filled 2022-02-07 (×6): qty 2

## 2022-02-07 MED ORDER — MELATONIN 5 MG PO TABS
5.0000 mg | ORAL_TABLET | Freq: Every day | ORAL | Status: DC
  Administered 2022-02-07 – 2022-02-08 (×2): 5 mg via ORAL
  Filled 2022-02-07 (×3): qty 1

## 2022-02-07 MED ORDER — ONDANSETRON HCL 4 MG/2ML IJ SOLN: 4.0000 mg | Freq: Four times a day (QID) | INTRAMUSCULAR | Status: DC | PRN

## 2022-02-07 MED ORDER — ROSUVASTATIN CALCIUM 5 MG PO TABS
5.0000 mg | ORAL_TABLET | Freq: Every day | ORAL | Status: DC
  Administered 2022-02-08 – 2022-02-10 (×3): 5 mg via ORAL
  Filled 2022-02-07 (×3): qty 1

## 2022-02-07 NOTE — ED Triage Notes (Signed)
Patient arrives via [facility] with c/o stomach pain. EMS reports that patient had a bright read BM while they were assisting her. Daughter is at bedside and reports that patient does have dementia. Patient is alert to self and where abouts. EMS placed 18g in left AC.

## 2022-02-07 NOTE — Assessment & Plan Note (Addendum)
Hematochezia History of colonic angiodysplasia, diverticulosis and internal hemorrhoids Multiple antibiotic allergies Keep n.p.o. IV hydration Serial H&H Meropenem ordered due to allergy to multiple classes of antibiotics. Patient unable to specify the reaction.9Took cipro and flagyl  several years ago but now allergic) Pain control Hold Eliquis and use SCD for DVT prophylaxis

## 2022-02-07 NOTE — Assessment & Plan Note (Signed)
Hold dicyclomine

## 2022-02-07 NOTE — Assessment & Plan Note (Signed)
Chronic anticoagulation Holding Eliquis due to hematochezia

## 2022-02-07 NOTE — Assessment & Plan Note (Signed)
Follows with endocrinology Continue Sensipar

## 2022-02-07 NOTE — ED Notes (Signed)
EKG to EDP Isaacs in person.  

## 2022-02-07 NOTE — Progress Notes (Signed)
Pharmacy Antibiotic Note  [name] is a 89 y.o. female admitted on 02/07/2022 with intra-abdominal infection.  Pharmacy has been consulted for Meropenem dosing.  Plan: Meropenem 2 q12hr per indication & renal fxn.  Pharmacy will continue to follow and will adjust abx dosing whenever warranted.  Temp (24hrs), Avg:97.9 F (36.6 C), Min:97.7 F (36.5 C), Max:98.1 F (36.7 C)   Recent Labs  Lab 02/07/22 1642  WBC 15.4*  CREATININE 1.00    Estimated Creatinine Clearance: 34.1 mL/min (by C-G formula based on SCr of 1 mg/dL).    Allergies  Allergen Reactions   Aspirin    Ciprofloxacin    Corticosteroids     Presumed cause of rash/itching.  Do not use prednisone   Nexium [Esomeprazole Magnesium]    Niacin And Related    Propylene Glycol    Vagifem [Estradiol]    Allegra [Fexofenadine]    Bacitracin-Polymyxin B    Ceclor [Cefaclor]    Celebrex [Celecoxib]    Doxepin Hcl    Erythromycin    Flagyl [Metronidazole]    Hydrocortisone Other (See Comments)    After use of 1% HC cream, pt reported burning and feeling "scalded" in vaginal area.   Levofloxacin    Levsin [Hyoscyamine Sulfate]    Lidocaine Other (See Comments)    Unknown reaction   Macrobid [Nitrofurantoin Macrocrystal]    Marcaine [Bupivacaine Hcl]    Nsaids    Penicillins    Prevacid [Lansoprazole]     Able to tolerate omeprazole.    Pseudoephedrine    Sulfonamide Derivatives    Tavist [Clemastine]    Cortisone Rash    Any hydrocortisone cream component with severe vaginal burning    Antimicrobials this admission: 1/23 Meropenem >>  1/23 Flagyl >> x 1 dose 1/23 Cipro >> x  dose  Microbiology results: No lab cx currently ordered or pending at this time.  Thank you for allowing pharmacy to be a part of this patient's care.  Renda Rolls, PharmD, [facility] 02/07/2022 10:48 PM

## 2022-02-07 NOTE — ED Provider Notes (Signed)
[facility] Provider Note    Event Date/Time   First MD Initiated Contact with Patient 02/07/22 1643     (approximate)   History   Chief Complaint Abdominal Pain   HPI  [name] is a 89 y.o. female with past medical history of hypertension, hyperlipidemia, atrial fibrillation on Eliquis, and dementia who presents to the ED complaining of abdominal pain.  History is limited due to patient's baseline dementia, daughter states that staff at her nursing facility were concerned that she seemed pale with borderline low blood pressure.  EMS was called, and when they arrived patient apparently had a bowel movement mixed with bright red blood.  Patient does not recall seeing blood in her stool, does endorse abdominal pain, particularly in her bilateral lower quadrants.  She reports feeling nauseous but has not vomited and denies any fevers, dysuria, or flank pain.     Physical Exam   Triage Vital Signs: ED Triage Vitals  Enc Vitals Group     BP 02/07/22 1637 (!) 141/72     Pulse Rate 02/07/22 1637 77     Resp 02/07/22 1637 20     Temp 02/07/22 1637 97.7 F (36.5 C)     Temp src --      SpO2 02/07/22 1637 96 %     Weight 02/07/22 1638 135 lb (61.2 kg)     Height 02/07/22 1638 '5\' 2"'$  (1.575 m)     Head Circumference --      Peak Flow --      Pain Score 02/07/22 1638 0     Pain Loc --      Pain Edu? --      Excl. in [place]? --     Most recent vital signs: Vitals:   02/07/22 1637 02/07/22 1730  BP: (!) 141/72 125/69  Pulse: 77 (!) 122  Resp: 20 (!) 22  Temp: 97.7 F (36.5 C)   SpO2: 96% 100%    Constitutional: Alert and oriented. Eyes: Conjunctivae are normal. Head: Atraumatic. Nose: No congestion/rhinnorhea. Mouth/Throat: Mucous membranes are moist.  Cardiovascular: Normal rate, regular rhythm. Grossly normal heart sounds.  2+ radial pulses bilaterally. Respiratory: Normal respiratory effort.  No retractions. Lungs CTAB. Gastrointestinal:  Soft and tender to palpation in the bilateral lower quadrants with no rebound or guarding. No distention.  Rectal exam with small amount of bright red blood, enlarged hemorrhoid noted. Musculoskeletal: No lower extremity tenderness nor edema.  Neurologic:  Normal speech and language. No gross focal neurologic deficits are appreciated.    ED Results / Procedures / Treatments   Labs (all labs ordered are listed, but only abnormal results are displayed) Labs Reviewed  CBC WITH DIFFERENTIAL/PLATELET - Abnormal; Notable for the following components:      Result Value   WBC 15.4 (*)    MCV 100.2 (*)    Neutro Abs 13.5 (*)    All other components within normal limits  COMPREHENSIVE METABOLIC PANEL - Abnormal; Notable for the following components:   Glucose, Bld 126 (*)    Calcium 10.4 (*)    Total Bilirubin 1.3 (*)    GFR, Estimated 55 (*)    All other components within normal limits  URINALYSIS, ROUTINE W REFLEX MICROSCOPIC - Abnormal; Notable for the following components:   Color, Urine STRAW (*)    APPearance CLEAR (*)    Specific Gravity, Urine 1.038 (*)    Hgb urine dipstick SMALL (*)    All other components within  normal limits  LIPASE, BLOOD     EKG  ED ECG REPORT I, Blake Divine, the attending physician, personally viewed and interpreted this ECG.   Date: 02/07/2022  EKG Time: 16:31  Rate: 79  Rhythm: normal sinus rhythm  Axis: Normal  Intervals:none  ST&T Change: None  RADIOLOGY CT abdomen/pelvis reviewed and interpreted by me with inflammatory changes in the left lower quadrant concerning for diverticulitis, no focal fluid collection noted.  PROCEDURES:  Critical Care performed: No  Procedures   MEDICATIONS ORDERED IN ED: Medications  ciprofloxacin (CIPRO) IVPB 400 mg (has no administration in time range)  metroNIDAZOLE (FLAGYL) IVPB 500 mg (has no administration in time range)  lactated ringers bolus 1,000 mL (0 mLs Intravenous Stopped 02/07/22 1857)   ondansetron (ZOFRAN) injection 4 mg (4 mg Intravenous Given 02/07/22 1729)  iohexol (OMNIPAQUE) 300 MG/ML solution 100 mL (100 mLs Intravenous Contrast Given 02/07/22 1759)     IMPRESSION / MDM / ASSESSMENT AND PLAN / ED COURSE  I reviewed the triage vital signs and the nursing notes.                              89 y.o. female with past medical history of hypertension, hyperlipidemia, and atrial fibrillation on Eliquis who presents to the ED complaining of abdominal pain and nausea for the past couple of days, noted to have a bowel movement mixed with bright red blood prior to arrival.  Patient's presentation is most consistent with acute presentation with potential threat to life or bodily function.  Differential diagnosis includes, but is not limited to, upper GI bleed, lower GI bleed, rectal bleeding, anemia, electrolyte abnormality, AKI, UTI.  Patient nontoxic-appearing and in no acute distress, vital signs are unremarkable.  She does have tenderness to palpation in the bilateral lower quadrants of her abdomen, rectal exam with small amount of bright red blood, potentially associated with enlarged hemorrhoid.  We will further assess with labs including CBC, CMP, and lipase.  We will also check CT imaging of her abdomen/pelvis.  Plan to treat symptomatically with IV Zofran and IV fluids, patient declines pain medication.  CT imaging shows evidence of colitis, nearby diverticula noted by radiology and we will treat as diverticulitis.  Patient seems to have numerous medication allergies, but cannot recall what reaction she has had to any of these medications.  I was able to find discharge summary from 2013 where she tolerated Cipro and Flagyl for diverticulitis and we will treat with Cipro and Flagyl today.  Labs are reassuring with leukocytosis consistent with diverticulitis, no significant anemia, electrolyte abnormality, or AKI noted.  LFTs and lipase are unremarkable, urinalysis shows no  signs of infection.  Patient did pass stool here in the ED mixed with bright red blood and would benefit from admission for GI bleeding associated with diverticulitis.  Case discussed with hospitalist for admission.      FINAL CLINICAL IMPRESSION(S) / ED DIAGNOSES   Final diagnoses:  Diverticulitis  Gastrointestinal hemorrhage, unspecified gastrointestinal hemorrhage type     Rx / DC Orders   ED Discharge Orders     None        Note:  This document was prepared using Dragon voice recognition software and may include unintentional dictation errors.   Blake Divine, MD 02/07/22 1946

## 2022-02-07 NOTE — Assessment & Plan Note (Signed)
Chronic and stable Bili 1.3 with otherwise normal LFTs

## 2022-02-07 NOTE — ED Notes (Signed)
Pt assisted to restroom. Pt said she needed to have a bowel movement but instead urinated. There was a small amount of blood present. This tech left it in the hat that was put on the toilet. Mary, RN notified and pt has no other needs at this moment.

## 2022-02-07 NOTE — H&P (Signed)
History and Physical    Patient: [name] YBO:[id number] DOB: [birth year] DOA: 02/07/2022 DOS: the patient was seen and examined on 02/07/2022 PCP: Tonia Ghent, MD  Patient coming from: Home  Chief Complaint:  Chief Complaint  Patient presents with   Abdominal Pain    HPI: [name] is a 89 y.o. female with medical history significant for mitral regurgitation, paroxysmal atrial fibrillation on anticoagulation ,hyperlipidemia , primary hyperparathyroidism followed by endocrinology, and multiple medication allergies, who presents to the ED with lower abdominal pain x 2 days and a bloody bowel movement while being transferred to the EMS vehicle and another following triage in the ED.  She denies nausea or vomiting or fever or chills.  Denies constipation or NSAID use.  Denies lightheadedness, palpitations or shortness of breath.  Patient has a remote hospitalization in 2013 for acute pancolitis treated successfully with antibiotics.  She had a positive Hemoccult on routine screening in 2020 and underwent colonoscopy with findings of a single nonbleeding cecal angiodysplastic lesion , moderate diverticulosis and internal hemorrhoids.   ED course and data review: Tachycardic to 122 with otherwise stable vitals.  Labs significant for WBC of 15,000, normal hemoglobin of 14.5.  CMP, lipase and UA unremarkable.EKG, personally viewed and interpreted shows sinus rhythm at 79 with no acute ST-T wave changes CT abdomen and pelvis with contrast showed left-sided colonic wall thickening with stranding and vascular engorgement suggesting subtle colitis and few colonic diverticula among other findings as outlined as follows: IMPRESSION: Left-sided colonic wall thickening with stranding and vascular engorgement. Please correlate for subtle colitis. Few colonic diverticula. No obstruction, free air or free fluid.  Fatty liver infiltration.  Previous cholecystectomy.  Slight nodular contour of  the cervix but this was seen on remote study of 2016  Patient was given a fluid bolus started on Cipro and Flagyl for colitis/diverticulitis of infectious etiology with known multiple antibiotic allergies.  Hospitalist consulted for admission.   Review of Systems: As mentioned in the history of present illness. All other systems reviewed and are negative.  Past Medical History:  Diagnosis Date   Anxiety    AR (allergic rhinitis)    Atrial fibrillation (HCC)    Basal cell carcinoma of [place] 2014   Benign neoplasm stomach body    Cataract    corrected with surgery   Colitis    CTS (carpal tunnel syndrome)    Diverticulitis    Diverticulosis    Esophageal stricture    Frequent headaches    likely migraine   GERD (gastroesophageal reflux disease)    Hepatic steatosis    Hyperlipidemia    Hypertension    IBS (irritable bowel syndrome)    Insomnia    Internal hemorrhoids    Osteopenia    Past Surgical History:  Procedure Laterality Date   CATARACT EXTRACTION, BILATERAL     August 2018, October 2018   CHOLECYSTECTOMY     MOHS SURGERY  2014   nose   Social History:  reports that she has never smoked. She has never used smokeless tobacco. She reports that she does not drink alcohol and does not use drugs.  Allergies  Allergen Reactions   Aspirin    Ciprofloxacin    Corticosteroids     Presumed cause of rash/itching.  Do not use prednisone   Nexium [Esomeprazole Magnesium]    Niacin And Related    Propylene Glycol    Vagifem [Estradiol]    Allegra [Fexofenadine]    Bacitracin-Polymyxin  B    Ceclor [Cefaclor]    Celebrex [Celecoxib]    Doxepin Hcl    Erythromycin    Flagyl [Metronidazole]    Hydrocortisone Other (See Comments)    After use of 1% HC cream, pt reported burning and feeling "scalded" in vaginal area.   Levofloxacin    Levsin [Hyoscyamine Sulfate]    Lidocaine Other (See Comments)    Unknown reaction   Macrobid [Nitrofurantoin Macrocrystal]     Marcaine [Bupivacaine Hcl]    Nsaids    Penicillins    Prevacid [Lansoprazole]     Able to tolerate omeprazole.    Pseudoephedrine    Sulfonamide Derivatives    Tavist [Clemastine]    Cortisone Rash    Any hydrocortisone cream component with severe vaginal burning    Family History  Problem Relation Age of Onset   Cancer Other    Diabetes Other    CAD Brother    Leukemia Father    Pancreatic cancer Brother    CAD Mother    Breast cancer Sister    Mesothelioma Brother    Colon cancer Neg Hx    Hypercalcemia Neg Hx     Prior to Admission medications   Medication Sig Start Date End Date Taking? Authorizing Provider  acetaminophen (TYLENOL) 325 MG tablet Take 650 mg by mouth every 4 (four) hours as needed.    [provider]  butalbital-acetaminophen-caffeine (FIORICET) 50-325-40 MG tablet TAKE 1 TABLET BY MOUTH 2 TIMES DAILY AS NEEDED. 04/19/21   Tonia Ghent, MD  carbamide peroxide (DEBROX) 6.5 % OTIC solution Place 5 drops into both ears as needed. 09/05/21   Tonia Ghent, MD  cinacalcet (SENSIPAR) 30 MG tablet Take 1 tablet (30 mg total) by mouth 3 (three) times a week. 04/06/21   Renato Shin, MD  dicyclomine (BENTYL) 10 MG capsule TAKE 1 CAPSULE BY MOUTH UP TO 4 TIMES A DAY AS NEEDED FOR ABDOMINAL CRAMPS 03/12/20   Tonia Ghent, MD  diphenhydrAMINE (BENADRYL ALLERGY) 25 MG tablet Take 1 tablet (25 mg total) by mouth every 6 (six) hours as needed. 09/05/21   Tonia Ghent, MD  diphenhydrAMINE (BENADRYL) 2 % cream Apply topically 3 (three) times daily as needed for itching. 10/24/21   Tonia Ghent, MD  ELIQUIS 2.5 MG TABS tablet TAKE 1 TABLET BY MOUTH TWICE A DAY 04/19/21   Tonia Ghent, MD  ezetimibe (ZETIA) 10 MG tablet TAKE 1 TABLET BY MOUTH DAILY 02/28/21   Tonia Ghent, MD  magnesium hydroxide (MILK OF MAGNESIA) 400 MG/5ML suspension Take 5 mLs by mouth as needed for mild constipation. 09/05/21   Tonia Ghent, MD  meclizine (ANTIVERT) 25 MG  tablet Take 25 mg by mouth 3 (three) times daily as needed. For vertigo    [provider]  melatonin 5 MG TABS Take 5 mg by mouth.    [provider]  Multiple Vitamin (MULTIVITAMIN) tablet Take 1 tablet by mouth daily.    [provider]  rosuvastatin (CRESTOR) 5 MG tablet TAKE 1/2 TABLET BY MOUTH 5 DAYS A WEEK 03/28/21   Tonia Ghent, MD    Physical Exam: Vitals:   02/07/22 1637 02/07/22 1638 02/07/22 1730  BP: (!) 141/72  125/69  Pulse: 77  (!) 122  Resp: 20  (!) 22  Temp: 97.7 F (36.5 C)    SpO2: 96%  100%  Weight:  61.2 kg   Height:  '5\' 2"'$  (1.575 m)  Physical Exam Vitals and nursing note reviewed.  Constitutional:      General: She is not in acute distress. HENT:     Head: Normocephalic and atraumatic.  Cardiovascular:     Rate and Rhythm: Normal rate and regular rhythm.     Heart sounds: Normal heart sounds.  Pulmonary:     Effort: Pulmonary effort is normal.     Breath sounds: Normal breath sounds.  Abdominal:     Palpations: Abdomen is soft.     Tenderness: There is abdominal tenderness in the left lower quadrant.  Neurological:     Mental Status: Mental status is at baseline.     Labs on Admission: I have personally reviewed following labs and imaging studies  CBC: Recent Labs  Lab 02/07/22 1642  WBC 15.4*  NEUTROABS 13.5*  HGB 14.5  HCT 44.2  MCV 100.2*  PLT 425   Basic Metabolic Panel: Recent Labs  Lab 02/07/22 1642  NA 136  K 4.8  CL 103  CO2 25  GLUCOSE 126*  BUN 14  CREATININE 1.00  CALCIUM 10.4*   GFR: Estimated Creatinine Clearance: 34.1 mL/min (by C-G formula based on SCr of 1 mg/dL). Liver Function Tests: Recent Labs  Lab 02/07/22 1642  AST 33  ALT 16  ALKPHOS 95  BILITOT 1.3*  PROT 7.6  ALBUMIN 4.0   Recent Labs  Lab 02/07/22 1642  LIPASE 35   No results for input(s): "AMMONIA" in the last 168 hours. Coagulation Profile: No results for input(s): "INR", "PROTIME" in the last 168  hours. Cardiac Enzymes: No results for input(s): "CKTOTAL", "CKMB", "CKMBINDEX", "TROPONINI" in the last 168 hours. BNP (last 3 results) No results for input(s): "PROBNP" in the last 8760 hours. HbA1C: No results for input(s): "HGBA1C" in the last 72 hours. CBG: No results for input(s): "GLUCAP" in the last 168 hours. Lipid Profile: No results for input(s): "CHOL", "HDL", "LDLCALC", "TRIG", "CHOLHDL", "LDLDIRECT" in the last 72 hours. Thyroid Function Tests: No results for input(s): "TSH", "T4TOTAL", "FREET4", "T3FREE", "THYROIDAB" in the last 72 hours. Anemia Panel: No results for input(s): "VITAMINB12", "FOLATE", "FERRITIN", "TIBC", "IRON", "RETICCTPCT" in the last 72 hours. Urine analysis:    Component Value Date/Time   COLORURINE STRAW (A) 02/07/2022 1907   APPEARANCEUR CLEAR (A) 02/07/2022 1907   LABSPEC 1.038 (H) 02/07/2022 1907   PHURINE 6.0 02/07/2022 1907   GLUCOSEU NEGATIVE 02/07/2022 1907   HGBUR SMALL (A) 02/07/2022 1907   BILIRUBINUR NEGATIVE 02/07/2022 1907   BILIRUBINUR n 11/12/2017 1136   KETONESUR NEGATIVE 02/07/2022 1907   PROTEINUR NEGATIVE 02/07/2022 1907   UROBILINOGEN 0.2 11/12/2017 1136   UROBILINOGEN 0.2 01/06/2012 0802   NITRITE NEGATIVE 02/07/2022 1907   LEUKOCYTESUR NEGATIVE 02/07/2022 1907    Radiological Exams on Admission: CT Abdomen Pelvis W Contrast  Result Date: 02/07/2022 CLINICAL DATA:  Acute abdominal pain. Stomach pain. Bright red blood per rectum. EXAM: CT ABDOMEN AND PELVIS WITH CONTRAST TECHNIQUE: Multidetector CT imaging of the abdomen and pelvis was performed using the standard protocol following bolus administration of intravenous contrast. RADIATION DOSE REDUCTION: This exam was performed according to the departmental dose-optimization program which includes automated exposure control, adjustment of the mA and/or kV according to patient size and/or use of iterative reconstruction technique. CONTRAST:  159m OMNIPAQUE IOHEXOL 300 MG/ML   SOLN COMPARISON:  CT May 2016 and older. FINDINGS: Lower chest: Heart is enlarged. There is some motion lung bases. Mild linear opacity at the bases likely scar or atelectasis. Hepatobiliary: Fatty liver infiltration.  No space-occupying liver lesion. Previous cholecystectomy. Patent portal vein. Pancreas: There is some atrophy along the pancreas without obvious mass lesion or ductal dilatation. Spleen: Spleen is nonenlarged. Adrenals/Urinary Tract: Adrenal glands are preserved. No enhancing renal mass or collecting system dilatation. Prominent bilateral renal sinus fat. Tiny low-attenuation lesion laterally along the left kidney measuring 3 mm is too small to completely characterize but unchanged from the study of 2016 consistent with a benign cyst. No specific recommended imaging follow-up. The ureters are seen with a normal course and caliber down to the bladder. Preserved contours of the urinary bladder. Stomach/Bowel: On this non oral contrast exam, the colon is nondilated. However there is wall thickening identified along the splenic flexure with slight vascular engorgement and stranding. Please correlate for colitis. There is some mild wall thickening along the distal transverse colon as well. A few colonic diverticula greatest in the sigmoid region. There is debris in the mildly distended stomach. Small bowel is nondilated. Vascular/Lymphatic: Normal caliber aorta and IVC with scattered vascular calcifications. No specific abnormal lymph node enlargement present in the abdomen and pelvis. Reproductive: Uterus is present. Slight nodular thickening along the margin of the cervix but this was seen on the prior examination in 2016. No separate adnexal mass. Other: No ascites. Slight rectus muscle diastasis with some protuberance at the level of the umbilicus. There is a small calcification in the central pelvic mesentery on image 62 which could be calcified lymph node and sequela of old infectious or inflammatory  process. This was seen on the prior and has slightly more calcification today. Benign configuration. Musculoskeletal: Degenerative changes are identified along the spine and pelvis. Slight curvature of the spine. Multilevel retrolisthesis such as L1-2, T12-L1. Trace anterolisthesis of L4 on L5. IMPRESSION: Left-sided colonic wall thickening with stranding and vascular engorgement. Please correlate for subtle colitis. Few colonic diverticula. No obstruction, free air or free fluid. Fatty liver infiltration.  Previous cholecystectomy. Slight nodular contour of the cervix but this was seen on remote study of 2016 Electronically Signed   By: Jill Side M.D.   On: 02/07/2022 18:25     Data Reviewed: Relevant notes from primary care and specialist visits, past discharge summaries as available in EHR, including Care Everywhere. Prior diagnostic testing as pertinent to current admission diagnoses Updated medications and problem lists for reconciliation ED course, including vitals, labs, imaging, treatment and response to treatment Triage notes, nursing and pharmacy notes and ED provider's notes Notable results as noted in HPI   Assessment and Plan: Acute colitis Hematochezia History of colonic angiodysplasia, diverticulosis and internal hemorrhoids Multiple antibiotic allergies Keep n.p.o. IV hydration Serial H&H Meropenem ordered due to allergy to multiple classes of antibiotics. Patient unable to specify the reaction.9Took cipro and flagyl  several years ago but now allergic) Pain control Hold Eliquis and use SCD for DVT prophylaxis    Atrial fibrillation (HCC) Chronic anticoagulation Holding Eliquis due to hematochezia  Hepatic steatosis Chronic and stable Bili 1.3 with otherwise normal LFTs  Primary hyperparathyroidism ([place]) Follows with endocrinology Continue Sensipar  IBS (irritable bowel syndrome) Hold dicyclomine        DVT prophylaxis: SCD  Consults: GI Dr  Haig Prophet  Advance Care Planning:   Code Status: Prior   Family Communication: none  Disposition Plan: Back to previous home environment  Severity of Illness: The appropriate patient status for this patient is INPATIENT. Inpatient status is judged to be reasonable and necessary in order to provide the required intensity of service to ensure  the patient's safety. The patient's presenting symptoms, physical exam findings, and initial radiographic and laboratory data in the context of their chronic comorbidities is felt to place them at high risk for further clinical deterioration. Furthermore, it is not anticipated that the patient will be medically stable for discharge from the hospital within 2 midnights of admission.   * I certify that at the point of admission it is my clinical judgment that the patient will require inpatient hospital care spanning beyond 2 midnights from the point of admission due to high intensity of service, high risk for further deterioration and high frequency of surveillance required.*  Author: Athena Masse, MD 02/07/2022 8:08 PM  For on call review [email or web address].

## 2022-02-08 DIAGNOSIS — K529 Noninfective gastroenteritis and colitis, unspecified: Secondary | ICD-10-CM | POA: Diagnosis not present

## 2022-02-08 DIAGNOSIS — K921 Melena: Secondary | ICD-10-CM | POA: Diagnosis not present

## 2022-02-08 LAB — PHOSPHORUS: Phosphorus: 3.1 mg/dL (ref 2.5–4.6)

## 2022-02-08 LAB — HEMOGLOBIN: Hemoglobin: 13.3 g/dL (ref 12.0–15.0)

## 2022-02-08 LAB — CBC
HCT: 38.6 % (ref 36.0–46.0)
Hemoglobin: 12.9 g/dL (ref 12.0–15.0)
MCH: 32.9 pg (ref 26.0–34.0)
MCHC: 33.4 g/dL (ref 30.0–36.0)
MCV: 98.5 fL (ref 80.0–100.0)
Platelets: 187 10*3/uL (ref 150–400)
RBC: 3.92 MIL/uL (ref 3.87–5.11)
RDW: 12.3 % (ref 11.5–15.5)
WBC: 9.9 10*3/uL (ref 4.0–10.5)
nRBC: 0 % (ref 0.0–0.2)

## 2022-02-08 LAB — MAGNESIUM: Magnesium: 2 mg/dL (ref 1.7–2.4)

## 2022-02-08 MED ORDER — LACTATED RINGERS IV SOLN: INTRAVENOUS | Status: DC

## 2022-02-08 NOTE — Plan of Care (Signed)
  Problem: Clinical Measurements: Goal: Ability to maintain clinical measurements within normal limits will improve Outcome: Progressing Goal: Will remain free from infection Outcome: Progressing   Problem: Nutrition: Goal: Adequate nutrition will be maintained Outcome: Progressing   Problem: Pain Managment: Goal: General experience of comfort will improve Outcome: Progressing   Problem: Safety: Goal: Ability to remain free from injury will improve Outcome: Progressing   

## 2022-02-08 NOTE — Progress Notes (Signed)
Spoke with the patients daughter [name] on the phone The patient has [name] She lives at [place] is to return to [facility] She walks miles per day all over without any DME

## 2022-02-08 NOTE — Progress Notes (Signed)
[facility] Progress Note  Patient: [name]    YSA:[id number]  DOA: 02/07/2022     Date of Service: the patient was seen and examined on 02/08/2022  Chief Complaint  Patient presents with   Abdominal Pain   Brief hospital course:  [name] is a 89 y.o. female with medical history significant for mitral regurgitation, paroxysmal atrial fibrillation on anticoagulation ,hyperlipidemia , primary hyperparathyroidism followed by endocrinology, and multiple medication allergies, who presents to the ED with lower abdominal pain x 2 days and a bloody bowel movement while being transferred to the EMS vehicle and another following triage in the ED.  She denies nausea or vomiting or fever or chills.  Denies constipation or NSAID use.  Denies lightheadedness, palpitations or shortness of breath.  Patient has a remote hospitalization in 2013 for acute pancolitis treated successfully with antibiotics.  She had a positive Hemoccult on routine screening in 2020 and underwent colonoscopy with findings of a single nonbleeding cecal angiodysplastic lesion , moderate diverticulosis and internal hemorrhoids.   ED course and data review: Tachycardic to 122 with otherwise stable vitals.  Labs significant for WBC of 15,000, normal hemoglobin of 14.5.  CMP, lipase and UA unremarkable.EKG, personally viewed and interpreted shows sinus rhythm at 79 with no acute ST-T wave changes CT abdomen and pelvis with contrast showed left-sided colonic wall thickening with stranding and vascular engorgement suggesting subtle colitis and few colonic diverticula among other findings as outlined as follows: IMPRESSION: Left-sided colonic wall thickening with stranding and vascular engorgement. Please correlate for subtle colitis. Few colonic diverticula. No obstruction, free air or free fluid.  Fatty liver infiltration.  Previous cholecystectomy.  Slight nodular contour of the cervix but this was seen on remote study of  2016   Patient was given a fluid bolus started on Cipro and Flagyl for colitis/diverticulitis of infectious etiology with known multiple antibiotic allergies.  Hospitalist consulted for admission.   Assessment and Plan: Acute colitis Hematochezia History of colonic angiodysplasia, diverticulosis and internal hemorrhoids Multiple antibiotic allergies IV hydration Serial H&H Meropenem ordered due to allergy to multiple classes of antibiotics. Patient unable to specify the reaction.9Took cipro and flagyl  several years ago but now allergic) Pain control Hold Eliquis and use SCD for DVT prophylaxis GI consult appreciated, recommended to start clear liquid diet, advance as per tolerance, no intervention at this time, continue to hold DOAC, may resume tomorrow.     Atrial fibrillation (HCC) Chronic anticoagulation Holding Eliquis due to hematochezia   Hepatic steatosis Chronic and stable Bili 1.3 with otherwise normal LFTs   Primary hyperparathyroidism ([place]) Follows with endocrinology Continue Sensipar   IBS (irritable bowel syndrome) Hold dicyclomine   Body mass index is 24.69 kg/m.  Interventions:     Diet: CLD  DVT Prophylaxis: SCD, pharmacological prophylaxis contraindicated due to GI bleeding    Advance goals of care discussion: Full code  Family Communication: family was not present at bedside, at the time of interview.  The pt provided permission to discuss medical plan with the family. Opportunity was given to ask question and all questions were answered satisfactorily.   Disposition:  Pt is from Home, admitted with lower GI bleeding, still has risk of bleeding, on clear liquid diet and holding Eliquis, which precludes a safe discharge. Discharge to SNF, when cleared by GI, may need 1-2 more days to improve.  Subjective: No significant events overnight, patient was admitted due to lower GI bleeding, had 1 bleeding episode yesterday, no bleeding  today.  He denies  any abdominal pain but she feels like a discomfort, complaining of headache as she was requesting to drink coffee. Patient denies any chest pain or palpitation, no shortness of breath,  Physical Exam: General: NAD, lying comfortably Appear in no distress, affect appropriate Eyes: PERRLA ENT: Oral Mucosa Clear, moist  Neck: no JVD,  Cardiovascular: S1 and S2 Present, no Murmur,  Respiratory: good respiratory effort, Bilateral Air entry equal and Decreased, no Crackles, no wheezes Abdomen: Bowel Sound present, Soft, mild LLQ and lower Abd tenderness,  Skin: no rashes Extremities: no Pedal edema, no calf tenderness Neurologic: without any new focal findings Gait not checked due to patient safety concerns  Vitals:   02/07/22 1638 02/07/22 1730 02/07/22 2148 02/08/22 0758  BP:  125/69 (!) 140/80 (!) 113/50  Pulse:  (!) 122 86 72  Resp:  (!) '22 16 16  '$ Temp:   98.1 F (36.7 C) 98 F (36.7 C)  TempSrc:   Oral   SpO2:  100% 95% 93%  Weight: 61.2 kg     Height: '5\' 2"'$  (1.575 m)      No intake or output data in the 24 hours ending 02/08/22 1525 Filed Weights   02/07/22 1638  Weight: 61.2 kg    Data Reviewed: I have personally reviewed and interpreted daily labs, tele strips, imagings as discussed above. I reviewed all nursing notes, pharmacy notes, vitals, pertinent old records I have discussed plan of care as described above with RN and patient/family.  CBC: Recent Labs  Lab 02/07/22 1642 02/08/22 0020 02/08/22 0557  WBC 15.4*  --  9.9  NEUTROABS 13.5*  --   --   HGB 14.5 13.3 12.9  HCT 44.2  --  38.6  MCV 100.2*  --  98.5  PLT 238  --  956   Basic Metabolic Panel: Recent Labs  Lab 02/07/22 1642 02/08/22 0557  NA 136  --   K 4.8  --   CL 103  --   CO2 25  --   GLUCOSE 126*  --   BUN 14  --   CREATININE 1.00  --   CALCIUM 10.4*  --   MG  --  2.0  PHOS  --  3.1    Studies: CT Abdomen Pelvis W Contrast  Result Date: 02/07/2022 CLINICAL DATA:  Acute  abdominal pain. Stomach pain. Bright red blood per rectum. EXAM: CT ABDOMEN AND PELVIS WITH CONTRAST TECHNIQUE: Multidetector CT imaging of the abdomen and pelvis was performed using the standard protocol following bolus administration of intravenous contrast. RADIATION DOSE REDUCTION: This exam was performed according to the departmental dose-optimization program which includes automated exposure control, adjustment of the mA and/or kV according to patient size and/or use of iterative reconstruction technique. CONTRAST:  183m OMNIPAQUE IOHEXOL 300 MG/ML  SOLN COMPARISON:  CT May 2016 and older. FINDINGS: Lower chest: Heart is enlarged. There is some motion lung bases. Mild linear opacity at the bases likely scar or atelectasis. Hepatobiliary: Fatty liver infiltration. No space-occupying liver lesion. Previous cholecystectomy. Patent portal vein. Pancreas: There is some atrophy along the pancreas without obvious mass lesion or ductal dilatation. Spleen: Spleen is nonenlarged. Adrenals/Urinary Tract: Adrenal glands are preserved. No enhancing renal mass or collecting system dilatation. Prominent bilateral renal sinus fat. Tiny low-attenuation lesion laterally along the left kidney measuring 3 mm is too small to completely characterize but unchanged from the study of 2016 consistent with a benign cyst. No specific recommended imaging follow-up. The ureters  are seen with a normal course and caliber down to the bladder. Preserved contours of the urinary bladder. Stomach/Bowel: On this non oral contrast exam, the colon is nondilated. However there is wall thickening identified along the splenic flexure with slight vascular engorgement and stranding. Please correlate for colitis. There is some mild wall thickening along the distal transverse colon as well. A few colonic diverticula greatest in the sigmoid region. There is debris in the mildly distended stomach. Small bowel is nondilated. Vascular/Lymphatic: Normal  caliber aorta and IVC with scattered vascular calcifications. No specific abnormal lymph node enlargement present in the abdomen and pelvis. Reproductive: Uterus is present. Slight nodular thickening along the margin of the cervix but this was seen on the prior examination in 2016. No separate adnexal mass. Other: No ascites. Slight rectus muscle diastasis with some protuberance at the level of the umbilicus. There is a small calcification in the central pelvic mesentery on image 62 which could be calcified lymph node and sequela of old infectious or inflammatory process. This was seen on the prior and has slightly more calcification today. Benign configuration. Musculoskeletal: Degenerative changes are identified along the spine and pelvis. Slight curvature of the spine. Multilevel retrolisthesis such as L1-2, T12-L1. Trace anterolisthesis of L4 on L5. IMPRESSION: Left-sided colonic wall thickening with stranding and vascular engorgement. Please correlate for subtle colitis. Few colonic diverticula. No obstruction, free air or free fluid. Fatty liver infiltration.  Previous cholecystectomy. Slight nodular contour of the cervix but this was seen on remote study of 2016 Electronically Signed   By: Jill Side M.D.   On: 02/07/2022 18:25    Scheduled Meds:  cinacalcet  30 mg Oral Once per day on Mon Wed Fri   melatonin  5 mg Oral QHS   rosuvastatin  5 mg Oral Daily   Continuous Infusions:  lactated ringers 75 mL/hr at 02/08/22 0922   meropenem (MERREM) IV 1 g (02/08/22 0923)   PRN Meds: acetaminophen **OR** acetaminophen, HYDROcodone-acetaminophen, morphine injection, ondansetron **OR** ondansetron (ZOFRAN) IV  Time spent: 50 minutes  Author: Val Riles. MD [facility] 02/08/2022 3:25 PM  To reach On-call, see care teams to locate the attending and reach out to them via [email or web address]. If 7PM-7AM, please contact night-coverage If you still have difficulty reaching the attending provider,  please page the [facility] (Director on Call) for [facility] on amion for assistance.

## 2022-02-08 NOTE — NC FL2 (Signed)
[place] LEVEL OF CARE FORM     IDENTIFICATION  Patient Name: [name] Birthdate: [birth year] Sex: female Admission Date (Current Location): 02/07/2022  [facility] and [place] Number:  Engineering geologist and Address:  [facility], [address], [place], [place] [address]      Provider Number: [id number]  Attending Physician Name and Address:  Val Riles, MD  Relative Name and Phone Number:  Elaina Pattee Daughter [phone]    Current Level of Care:   Recommended Level of Care: [place] Prior Approval Number:    Date Approved/Denied:   PASRR Number:    Discharge Plan: Other (Comment) (ALF)    Current Diagnoses: Patient Active Problem List   Diagnosis Date Noted   Acute colitis 02/07/2022   Hepatic steatosis 02/07/2022   History of colonic angiodysplasia, diverticulosis and internal hemorrhoids 02/07/2022   History of diverticulosis 02/07/2022   Multiple antibiotic allergies 02/07/2022   Grief 09/07/2021   Rash 12/01/2020   Vertigo 10/31/2020   Left foot pain 09/19/2020   Memory loss 08/29/2020   Primary hyperparathyroidism ([place]) 09/05/2019   DNR (do not resuscitate) 07/21/2019   Positive fecal occult blood test 08/02/2018   Chronic anticoagulation 08/02/2018   Abdominal pain 04/23/2018   Lower back pain 02/03/2018   Atrial fibrillation ([place]) 09/17/2017   Tachycardia 08/30/2017   Right foot pain 06/17/2017   Health care maintenance 06/06/2017   Advance care planning 06/06/2017   Anxiety 06/06/2017   Medicare annual wellness visit, subsequent 05/29/2017   Osteoporosis 10/08/2016   Insomnia    Frequent headaches    Hematochezia 01/06/2012   IBS (irritable bowel syndrome) 12/31/2007   CARCINOMA, BASAL CELL, SKIN 02/22/2007   HLD (hyperlipidemia) 02/22/2007   GASTROESOPHAGEAL REFLUX DISEASE 02/22/2007   CHOLECYSTECTOMY, HX OF 02/22/2007    Orientation RESPIRATION BLADDER Height & Weight      Self  Normal Continent Weight: 61.2 kg Height:  '5\' 2"'$  (157.5 cm)  BEHAVIORAL SYMPTOMS/MOOD NEUROLOGICAL BOWEL NUTRITION STATUS      Continent Diet (see dc summary)  AMBULATORY STATUS COMMUNICATION OF NEEDS Skin   Independent Verbally Normal                       Personal Care Assistance Level of Assistance  Bathing, Feeding, Dressing Bathing Assistance: Independent Feeding assistance: Independent Dressing Assistance: Independent     Functional Limitations Info  Sight, Hearing, Speech Sight Info: Adequate Hearing Info: Adequate Speech Info: Adequate    SPECIAL CARE FACTORS FREQUENCY                       Contractures Contractures Info: Not present    Additional Factors Info  Code Status, Allergies Code Status Info: Full code Allergies Info: Aspirin, Ciprofloxacin, Corticosteroids, Nexium (Esomeprazole Magnesium), Niacin And Related, Propylene Glycol, Vagifem (Estradiol), Allegra (Fexofenadine), Bacitracin-polymyxin B, Ceclor (Cefaclor), Celebrex (Celecoxib), Doxepin Hcl, Erythromycin, Flagyl (Metronidazole), Hydrocortisone, Levofloxacin, Levsin (Hyoscyamine Sulfate), Lidocaine, Macrobid (Nitrofurantoin Macrocrystal), Marcaine (Bupivacaine Hcl), Nsaids, Penicillins, Prevacid (Lansoprazole), Pseudoephedrine, Sulfonamide Derivatives, Tavist (Clemastine), Cortisone           Current Medications (02/08/2022):  This is the current hospital active medication list Current Facility-Administered Medications  Medication Dose Route Frequency Provider Last Rate Last Admin   acetaminophen (TYLENOL) tablet 650 mg  650 mg Oral Q6H PRN Athena Masse, MD       Or   acetaminophen (TYLENOL) suppository 650 mg  650 mg Rectal Q6H PRN  Athena Masse, MD       cinacalcet [facility]) tablet 30 mg  30 mg Oral Once per day on Mon Wed Fri Athena Masse, MD   30 mg at 02/08/22 9012   HYDROcodone-acetaminophen (NORCO/VICODIN) 5-325 MG per tablet 1-2 tablet  1-2 tablet Oral Q4H PRN  Athena Masse, MD   2 tablet at 02/08/22 0419   lactated ringers infusion   Intravenous Continuous Val Riles, MD 75 mL/hr at 02/08/22 0922 New Bag at 02/08/22 0922   melatonin tablet 5 mg  5 mg Oral QHS Judd Gaudier V, MD   5 mg at 02/07/22 2331   meropenem (MERREM) 1 g in sodium chloride 0.9 % 100 mL IVPB  1 g Intravenous Q12H Renda Rolls, RPH 200 mL/hr at 02/08/22 0923 1 g at 02/08/22 0923   morphine (PF) 2 MG/ML injection 2 mg  2 mg Intravenous Q2H PRN Athena Masse, MD       ondansetron [facility]) tablet 4 mg  4 mg Oral Q6H PRN Athena Masse, MD       Or   ondansetron [facility]) injection 4 mg  4 mg Intravenous Q6H PRN Athena Masse, MD       rosuvastatin (CRESTOR) tablet 5 mg  5 mg Oral Daily Athena Masse, MD   5 mg at 02/08/22 2241     Discharge Medications: Please see discharge summary for a list of discharge medications.  Relevant Imaging Results:  Relevant Lab Results:   Additional Information SS# [id number]  Conception Oms, RN

## 2022-02-08 NOTE — Plan of Care (Signed)

## 2022-02-08 NOTE — Consult Note (Signed)
Consultation  Referring Provider:  Hospitalist    Admit date: 02/07/2022 Consult date: 02/08/2022         Reason for Consultation: Hematochezia              HPI:   [name] is a 89 y.o. lady with history of a. Fib on DOAC, dementia (undiagnosed per daughter), constipation, and IBS who presented after developing abdominal pain and then subsequently two episodes of hematochezia. History mainly received from Daughter as patient with short term memory issues. The daughter states that yesterday the nursing staff noted low blood pressure and temperature along with abdominal pain. EMS was called and when transporting with EMS, it was noted she had an episode of small volume hematochezia and then another episode here at the hospital. CT imaging suggesting colitis around the splenic flexture. Patient with normal hemoglobin and mildly elevated white count. Patient was placed on antibiotics for possible diverticulitis. Patient had an unremarkable colonoscopy in 2020. Patient had similar episode in 2013 with f/u colonoscopy that was unremarkable although not done during the hospitalization. Daughter notes decreased appetite and decreased thirst as the patients husband passed last year around this time. Patient denies any current pain but has good insight that her memory is not doing very well. Daughter notes the mother is somewhat of a complainer and that any medicine that she takes and feels a little different, she will complain and the nursing staff will note it as an allergy. She has never had a true anaphylactic reaction/rash to any of the listed medications.  Past Medical History:  Diagnosis Date   Anxiety    AR (allergic rhinitis)    Atrial fibrillation (HCC)    Basal cell carcinoma of [place]a [place] 2014   Benign neoplasm stomach body    Cataract    corrected with surgery   Colitis    CTS (carpal tunnel syndrome)    Diverticulitis    Diverticulosis    Esophageal stricture    Frequent headaches     likely migraine   GERD (gastroesophageal reflux disease)    Hepatic steatosis    Hyperlipidemia    Hypertension    IBS (irritable bowel syndrome)    Insomnia    Internal hemorrhoids    Osteopenia     Past Surgical History:  Procedure Laterality Date   CATARACT EXTRACTION, BILATERAL     August 2018, October 2018   CHOLECYSTECTOMY     MOHS SURGERY  2014   nose    Family History  Problem Relation Age of Onset   Cancer Other    Diabetes Other    CAD Brother    Leukemia Father    Pancreatic cancer Brother    CAD Mother    Breast cancer Sister    Mesothelioma Brother    Colon cancer Neg Hx    Hypercalcemia Neg Hx     Social History   Tobacco Use   Smoking status: Never   Smokeless tobacco: Never  Vaping Use   Vaping Use: Never used  Substance Use Topics   Alcohol use: No   Drug use: No    Prior to Admission medications   Medication Sig Start Date End Date Taking? Authorizing Provider  acetaminophen (TYLENOL) 325 MG tablet Take 650 mg by mouth every 4 (four) hours as needed.   Yes [provider]  butalbital-acetaminophen-caffeine (FIORICET) 50-325-40 MG tablet TAKE 1 TABLET BY MOUTH 2 TIMES DAILY AS NEEDED. 04/19/21  Yes Tonia Ghent, MD  dicyclomine (  BENTYL) 10 MG capsule TAKE 1 CAPSULE BY MOUTH UP TO 4 TIMES A DAY AS NEEDED FOR ABDOMINAL CRAMPS 03/12/20  Yes Tonia Ghent, MD  diphenhydrAMINE (BENADRYL ALLERGY) 25 MG tablet Take 1 tablet (25 mg total) by mouth every 6 (six) hours as needed. 09/05/21  Yes Tonia Ghent, MD  diphenhydrAMINE (BENADRYL) 2 % cream Apply topically 3 (three) times daily as needed for itching. 10/24/21  Yes Tonia Ghent, MD  ELIQUIS 2.5 MG TABS tablet TAKE 1 TABLET BY MOUTH TWICE A DAY 04/19/21  Yes Tonia Ghent, MD  ezetimibe (ZETIA) 10 MG tablet TAKE 1 TABLET BY MOUTH DAILY 02/28/21  Yes Tonia Ghent, MD  magnesium hydroxide (MILK OF MAGNESIA) 400 MG/5ML suspension Take 5 mLs by mouth as needed for mild  constipation. 09/05/21  Yes Tonia Ghent, MD  meclizine (ANTIVERT) 25 MG tablet Take 25 mg by mouth 3 (three) times daily as needed. For vertigo   Yes [provider]  melatonin 5 MG TABS Take 5 mg by mouth.   Yes [provider]  Multiple Vitamin (MULTIVITAMIN) tablet Take 1 tablet by mouth daily.   Yes [provider]  rosuvastatin (CRESTOR) 5 MG tablet TAKE 1/2 TABLET BY MOUTH 5 DAYS A WEEK 03/28/21  Yes Tonia Ghent, MD  carbamide peroxide (DEBROX) 6.5 % OTIC solution Place 5 drops into both ears as needed. Patient not taking: Reported on 02/07/2022 09/05/21   Tonia Ghent, MD  cinacalcet (SENSIPAR) 30 MG tablet Take 1 tablet (30 mg total) by mouth 3 (three) times a week. Patient taking differently: Take 30 mg by mouth 3 (three) times a week. M,w,f 04/06/21   Renato Shin, MD    Current Facility-Administered Medications  Medication Dose Route Frequency Provider Last Rate Last Admin   acetaminophen (TYLENOL) tablet 650 mg  650 mg Oral Q6H PRN Athena Masse, MD       Or   acetaminophen (TYLENOL) suppository 650 mg  650 mg Rectal Q6H PRN Athena Masse, MD       cinacalcet [facility]) tablet 30 mg  30 mg Oral Once per day on Mon Wed Fri Athena Masse, MD   30 mg at 02/08/22 0981   HYDROcodone-acetaminophen (NORCO/VICODIN) 5-325 MG per tablet 1-2 tablet  1-2 tablet Oral Q4H PRN Athena Masse, MD   2 tablet at 02/08/22 0419   lactated ringers infusion   Intravenous Continuous Val Riles, MD 75 mL/hr at 02/08/22 0922 New Bag at 02/08/22 0922   melatonin tablet 5 mg  5 mg Oral QHS Judd Gaudier V, MD   5 mg at 02/07/22 2331   meropenem (MERREM) 1 g in sodium chloride 0.9 % 100 mL IVPB  1 g Intravenous Q12H Renda Rolls, RPH 200 mL/hr at 02/08/22 0923 1 g at 02/08/22 0923   morphine (PF) 2 MG/ML injection 2 mg  2 mg Intravenous Q2H PRN Athena Masse, MD       ondansetron [facility]) tablet 4 mg  4 mg Oral Q6H PRN Athena Masse, MD       Or    ondansetron [facility]) injection 4 mg  4 mg Intravenous Q6H PRN Athena Masse, MD       rosuvastatin (CRESTOR) tablet 5 mg  5 mg Oral Daily Athena Masse, MD   5 mg at 02/08/22 1914    Allergies as of 02/07/2022 - Review Complete 02/07/2022  Allergen Reaction Noted   Aspirin  10/24/2016  Ciprofloxacin  10/24/2016   Corticosteroids  10/08/2016   Nexium [esomeprazole magnesium]  10/24/2016   Niacin and related  10/24/2016   Propylene glycol  10/24/2016   Vagifem [estradiol]  10/24/2016   Allegra [fexofenadine]  10/08/2016   Bacitracin-polymyxin b     Ceclor [cefaclor]  10/08/2016   Celebrex [celecoxib]  10/08/2016   Doxepin hcl     Erythromycin     Flagyl [metronidazole]  10/08/2016   Hydrocortisone Other (See Comments) 04/04/2012   Levofloxacin     Levsin [hyoscyamine sulfate]  10/08/2016   Lidocaine Other (See Comments) 01/06/2012   Macrobid [nitrofurantoin macrocrystal]  10/08/2016   Marcaine [bupivacaine hcl]  01/06/2012   Nsaids  10/08/2016   Penicillins     Prevacid [lansoprazole]  10/08/2016   Pseudoephedrine     Sulfonamide derivatives     Tavist [clemastine]  10/08/2016   Cortisone Rash 02/02/2012     Review of Systems:    All systems reviewed and negative except where noted in HPI.  Review of Systems  Constitutional:  Negative for chills and fever.  Respiratory:  Negative for shortness of breath.   Cardiovascular:  Negative for chest pain.  Gastrointestinal:  Positive for blood in stool. Negative for abdominal pain.  Musculoskeletal:  Positive for joint pain.  Neurological:  Negative for focal weakness.  Psychiatric/Behavioral:  Negative for substance abuse.   All other systems reviewed and are negative.     Physical Exam:  Vital signs in last 24 hours: Temp:  [97.7 F (36.5 C)-98.1 F (36.7 C)] 98 F (36.7 C) (01/24 0758) Pulse Rate:  [72-122] 72 (01/24 0758) Resp:  [16-22] 16 (01/24 0758) BP: (113-141)/(50-80) 113/50 (01/24 0758) SpO2:  [93  %-100 %] 93 % (01/24 0758) Weight:  [61.2 kg] 61.2 kg (01/23 1638) Last BM Date : 02/07/22 General:   Pleasant in NAD Head:  Normocephalic and atraumatic. Eyes:   No icterus.   Conjunctiva pink. Mouth: Mucosa pink moist, no lesions. Neck:  Supple; no masses felt Lungs: No respiratory distress Abdomen:   Flat, soft, nondistended, nontender Rectal:  Not performed.  Msk: No clubbing or cyanosis. Strength 5/5 Neurologic:  No focal deficits. Only oriented to person and place Skin:  Warm, dry, pink without significant lesions or rashes. Psych:  Alert and cooperative. Normal affect.  LAB RESULTS: Recent Labs    02/07/22 1642 02/08/22 0020 02/08/22 0557  WBC 15.4*  --  9.9  HGB 14.5 13.3 12.9  HCT 44.2  --  38.6  PLT 238  --  187   BMET Recent Labs    02/07/22 1642  NA 136  K 4.8  CL 103  CO2 25  GLUCOSE 126*  BUN 14  CREATININE 1.00  CALCIUM 10.4*   LFT Recent Labs    02/07/22 1642  PROT 7.6  ALBUMIN 4.0  AST 33  ALT 16  ALKPHOS 95  BILITOT 1.3*   PT/INR No results for input(s): "LABPROT", "INR" in the last 72 hours.  STUDIES: CT Abdomen Pelvis W Contrast  Result Date: 02/07/2022 CLINICAL DATA:  Acute abdominal pain. Stomach pain. Bright red blood per rectum. EXAM: CT ABDOMEN AND PELVIS WITH CONTRAST TECHNIQUE: Multidetector CT imaging of the abdomen and pelvis was performed using the standard protocol following bolus administration of intravenous contrast. RADIATION DOSE REDUCTION: This exam was performed according to the departmental dose-optimization program which includes automated exposure control, adjustment of the mA and/or kV according to patient size and/or use of iterative reconstruction technique. CONTRAST:  144m  OMNIPAQUE IOHEXOL 300 MG/ML  SOLN COMPARISON:  CT May 2016 and older. FINDINGS: Lower chest: Heart is enlarged. There is some motion lung bases. Mild linear opacity at the bases likely scar or atelectasis. Hepatobiliary: Fatty liver infiltration.  No space-occupying liver lesion. Previous cholecystectomy. Patent portal vein. Pancreas: There is some atrophy along the pancreas without obvious mass lesion or ductal dilatation. Spleen: Spleen is nonenlarged. Adrenals/Urinary Tract: Adrenal glands are preserved. No enhancing renal mass or collecting system dilatation. Prominent bilateral renal sinus fat. Tiny low-attenuation lesion laterally along the left kidney measuring 3 mm is too small to completely characterize but unchanged from the study of 2016 consistent with a benign cyst. No specific recommended imaging follow-up. The ureters are seen with a normal course and caliber down to the bladder. Preserved contours of the urinary bladder. Stomach/Bowel: On this non oral contrast exam, the colon is nondilated. However there is wall thickening identified along the splenic flexure with slight vascular engorgement and stranding. Please correlate for colitis. There is some mild wall thickening along the distal transverse colon as well. A few colonic diverticula greatest in the sigmoid region. There is debris in the mildly distended stomach. Small bowel is nondilated. Vascular/Lymphatic: Normal caliber aorta and IVC with scattered vascular calcifications. No specific abnormal lymph node enlargement present in the abdomen and pelvis. Reproductive: Uterus is present. Slight nodular thickening along the margin of the cervix but this was seen on the prior examination in 2016. No separate adnexal mass. Other: No ascites. Slight rectus muscle diastasis with some protuberance at the level of the umbilicus. There is a small calcification in the central pelvic mesentery on image 62 which could be calcified lymph node and sequela of old infectious or inflammatory process. This was seen on the prior and has slightly more calcification today. Benign configuration. Musculoskeletal: Degenerative changes are identified along the spine and pelvis. Slight curvature of the spine.  Multilevel retrolisthesis such as L1-2, T12-L1. Trace anterolisthesis of L4 on L5. IMPRESSION: Left-sided colonic wall thickening with stranding and vascular engorgement. Please correlate for subtle colitis. Few colonic diverticula. No obstruction, free air or free fluid. Fatty liver infiltration.  Previous cholecystectomy. Slight nodular contour of the cervix but this was seen on remote study of 2016 Electronically Signed   By: Jill Side M.D.   On: 02/07/2022 18:25       Impression / Plan:   89 y/o lady with dementia, constipation, and a.fib on DOAC here with episode of abdominal pain and small volume hematochezia and CT imaging concerning for colitis. Given history, this is more consistent with ischemic colitis and not diverticulitis. Typically we would perform a flex sig to get biopsies to confirm but given dementia and classic presentation, would recommend conservative approach with fluids and monitoring  - ok for clears now, advance diet as tolerated - bowel regimen - gentle IV fluids - avoid hypotension - no plans for any endoscopic procedure right now - continue to hold DOAC, can likely restart tomorrow  Will continue to follow, please call with any questions or concerns  Raylene Miyamoto MD, MPH [place]

## 2022-02-09 DIAGNOSIS — K921 Melena: Secondary | ICD-10-CM | POA: Diagnosis not present

## 2022-02-09 LAB — IRON AND TIBC
Iron: 80 ug/dL (ref 28–170)
Saturation Ratios: 32 % — ABNORMAL HIGH (ref 10.4–31.8)
TIBC: 252 ug/dL (ref 250–450)
UIBC: 172 ug/dL

## 2022-02-09 LAB — BASIC METABOLIC PANEL
Anion gap: 3 — ABNORMAL LOW (ref 5–15)
BUN: 8 mg/dL (ref 8–23)
CO2: 27 mmol/L (ref 22–32)
Calcium: 9.3 mg/dL (ref 8.9–10.3)
Chloride: 106 mmol/L (ref 98–111)
Creatinine, Ser: 0.87 mg/dL (ref 0.44–1.00)
GFR, Estimated: >60 mL/min (ref >60)
Glucose, Bld: 93 mg/dL (ref 70–99)
Potassium: 4.6 mmol/L (ref 3.5–5.1)
Sodium: 136 mmol/L (ref 135–145)

## 2022-02-09 LAB — VITAMIN B12: Vitamin B-12: 333 pg/mL (ref 180–914)

## 2022-02-09 LAB — PHOSPHORUS: Phosphorus: 2.7 mg/dL (ref 2.5–4.6)

## 2022-02-09 LAB — CBC
HCT: 36 % (ref 36.0–46.0)
Hemoglobin: 11.9 g/dL — ABNORMAL LOW (ref 12.0–15.0)
MCH: 33.1 pg (ref 26.0–34.0)
MCHC: 33.1 g/dL (ref 30.0–36.0)
MCV: 100.3 fL — ABNORMAL HIGH (ref 80.0–100.0)
Platelets: 183 10*3/uL (ref 150–400)
RBC: 3.59 MIL/uL — ABNORMAL LOW (ref 3.87–5.11)
RDW: 12.5 % (ref 11.5–15.5)
WBC: 7.1 10*3/uL (ref 4.0–10.5)
nRBC: 0 % (ref 0.0–0.2)

## 2022-02-09 LAB — MAGNESIUM: Magnesium: 1.8 mg/dL (ref 1.7–2.4)

## 2022-02-09 LAB — FOLATE: Folate: 29 ng/mL (ref >5.9)

## 2022-02-09 MED ORDER — APIXABAN 2.5 MG PO TABS
2.5000 mg | ORAL_TABLET | Freq: Two times a day (BID) | ORAL | Status: DC
  Administered 2022-02-09 – 2022-02-10 (×2): 2.5 mg via ORAL
  Filled 2022-02-09 (×2): qty 1

## 2022-02-09 MED ORDER — PSYLLIUM 95 % PO PACK
1.0000 | PACK | Freq: Every day | ORAL | Status: DC
  Administered 2022-02-09 – 2022-02-10 (×2): 1 via ORAL
  Filled 2022-02-09 (×2): qty 1

## 2022-02-09 MED ORDER — METRONIDAZOLE 500 MG/100ML IV SOLN
500.0000 mg | Freq: Two times a day (BID) | INTRAVENOUS | Status: DC
  Administered 2022-02-10: 500 mg via INTRAVENOUS
  Filled 2022-02-09 (×2): qty 100

## 2022-02-09 MED ORDER — CEFAZOLIN SODIUM-DEXTROSE 2-4 GM/100ML-% IV SOLN
2.0000 g | Freq: Two times a day (BID) | INTRAVENOUS | Status: DC
  Administered 2022-02-09 – 2022-02-10 (×2): 2 g via INTRAVENOUS
  Filled 2022-02-09 (×2): qty 100

## 2022-02-09 MED ORDER — POLYETHYLENE GLYCOL 3350 17 G PO PACK
17.0000 g | PACK | Freq: Every day | ORAL | Status: DC
  Administered 2022-02-09 – 2022-02-10 (×2): 17 g via ORAL
  Filled 2022-02-09 (×2): qty 1

## 2022-02-09 NOTE — Plan of Care (Signed)

## 2022-02-09 NOTE — Progress Notes (Signed)
GI Inpatient Follow-up Note  Subjective:  Patient seen and doing well. Somewhat confused but pleasant. No abdominal pain. Spoke with nurse and some bright red blood when wiping but not in stool.  Scheduled Inpatient Medications:   cinacalcet  30 mg Oral Once per day on Mon Wed Fri   melatonin  5 mg Oral QHS   polyethylene glycol  17 g Oral Daily   psyllium  1 packet Oral Daily   rosuvastatin  5 mg Oral Daily    Continuous Inpatient Infusions:    lactated ringers 75 mL/hr at 02/08/22 2249   meropenem (MERREM) IV 1 g (02/09/22 1000)    PRN Inpatient Medications:  acetaminophen **OR** acetaminophen, HYDROcodone-acetaminophen, morphine injection, ondansetron **OR** ondansetron (ZOFRAN) IV  Review of Systems:  Unable to adequately assess due to confusion  Physical Examination: BP (!) 146/74 (BP Location: Left Arm)   Pulse 79   Temp 97.9 F (36.6 C)   Resp 17   Ht '5\' 2"'$  (1.575 m)   Wt 61.2 kg   LMP 01/17/1988 (Approximate)   SpO2 93%   BMI 24.69 kg/m  Gen: NAD HEENT: PEERLA, EOMI, Neck: supple, no JVD or thyromegaly Chest: No respiratory distress Abd: soft, non-distended Ext: no edema, well perfused with 2+ pulses, Skin: no rash or lesions noted Lymph: no LAD  Data: Lab Results  Component Value Date   WBC 7.1 02/09/2022   HGB 11.9 (L) 02/09/2022   HCT 36.0 02/09/2022   MCV 100.3 (H) 02/09/2022   PLT 183 02/09/2022   Recent Labs  Lab 02/08/22 0020 02/08/22 0557 02/09/22 0321  HGB 13.3 12.9 11.9*   Lab Results  Component Value Date   NA 136 02/09/2022   K 4.6 02/09/2022   CL 106 02/09/2022   CO2 27 02/09/2022   BUN 8 02/09/2022   CREATININE 0.87 02/09/2022   Lab Results  Component Value Date   ALT 16 02/07/2022   AST 33 02/07/2022   ALKPHOS 95 02/07/2022   BILITOT 1.3 (H) 02/07/2022   No results for input(s): "APTT", "INR", "PTT" in the last 168 hours. Assessment/Plan: [name] is a 89 y.o. lady with dementia, constipation, and a.fib on  DOAC here with episode of abdominal pain and small volume hematochezia and CT imaging concerning for colitis. Given history, this is more consistent with ischemic colitis and not diverticulitis. Typically we would perform a flex sig to get biopsies to confirm but given dementia and classic presentation, would recommend conservative approach with fluids and monitoring  Recommendations:  - advanced diet to heart healthy - started miralax and metamucil - ok to restart DOAC - should get evaluation for dementia as an outpatient - no plans for any endoscopic procedures - consider trying to get back to her facility sooner rather than later due to risk of delirium from being in a new place  We will follow peripherally if still here, please call with any questions or concerns.  Raylene Miyamoto MD, MPH [place]

## 2022-02-09 NOTE — Plan of Care (Signed)
  Problem: Nutrition: Goal: Adequate nutrition will be maintained Outcome: Progressing   Problem: Elimination: Goal: Will not experience complications related to bowel motility Outcome: Progressing   Problem: Pain Managment: Goal: General experience of comfort will improve Outcome: Progressing   Problem: Safety: Goal: Ability to remain free from injury will improve Outcome: Progressing   

## 2022-02-09 NOTE — Progress Notes (Addendum)
[facility] Progress Note  Patient: [name]    FWY:[id number]  DOA: 02/07/2022     Date of Service: the patient was seen and examined on 02/09/2022  Chief Complaint  Patient presents with   Abdominal Pain   Brief hospital course:  ancy KARYL [name] is a 89 y.o. female with medical history significant for mitral regurgitation, paroxysmal atrial fibrillation on anticoagulation ,hyperlipidemia , primary hyperparathyroidism followed by endocrinology, and multiple medication allergies, who presents to the ED with lower abdominal pain x 2 days and a bloody bowel movement while being transferred to the EMS vehicle and another following triage in the ED.  She denies nausea or vomiting or fever or chills.  Denies constipation or NSAID use.  Denies lightheadedness, palpitations or shortness of breath.  Patient has a remote hospitalization in 2013 for acute pancolitis treated successfully with antibiotics.  She had a positive Hemoccult on routine screening in 2020 and underwent colonoscopy with findings of a single nonbleeding cecal angiodysplastic lesion , moderate diverticulosis and internal hemorrhoids.   ED course and data review: Tachycardic to 122 with otherwise stable vitals.  Labs significant for WBC of 15,000, normal hemoglobin of 14.5.  CMP, lipase and UA unremarkable.EKG, personally viewed and interpreted shows sinus rhythm at 79 with no acute ST-T wave changes CT abdomen and pelvis with contrast showed left-sided colonic wall thickening with stranding and vascular engorgement suggesting subtle colitis and few colonic diverticula among other findings as outlined as follows: IMPRESSION: Left-sided colonic wall thickening with stranding and vascular engorgement. Please correlate for subtle colitis. Few colonic diverticula. No obstruction, free air or free fluid.  Fatty liver infiltration.  Previous cholecystectomy.  Slight nodular contour of the cervix but this was seen on remote study of  2016   Patient was given a fluid bolus started on Cipro and Flagyl for colitis/diverticulitis of infectious etiology with known multiple antibiotic allergies.  Hospitalist consulted for admission.   Assessment and Plan: Acute colitis Hematochezia History of colonic angiodysplasia, diverticulosis and internal hemorrhoids Multiple antibiotic allergies IV hydration Serial H&H Meropenem ordered due to allergy to multiple classes of antibiotics. Patient unable to specify the reaction.9Took cipro and flagyl  several years ago but now allergic) Pain control 1/25 resumed Eliquis 2.5 mg p.o. bid as per GI GI consulted, advance diet, resume DOAC, no plan for endoluminal exam at this time.  Started MiraLAX and Metamucil. Hb 14.5---11.9 Continue to monitor H&H and transfuse if hemoglobin less than 7.     Atrial fibrillation (HCC) Chronic anticoagulation 1/25 resumed Eliquis 2.5 mg p.o. bid as per GI   Hepatic steatosis Chronic and stable Bili 1.3 with otherwise normal LFTs   Primary hyperparathyroidism ([place]) Follows with endocrinology Continue Sensipar   IBS (irritable bowel syndrome) Hold dicyclomine   Body mass index is 24.69 kg/m.  Interventions:     Diet: Heart healthy diet DVT Prophylaxis: Therapeutic Anticoagulation with Eliquis    Advance goals of care discussion: Full code  Family Communication: family was not present at bedside, at the time of interview.  The pt provided permission to discuss medical plan with the family. Opportunity was given to ask question and all questions were answered satisfactorily.   Disposition:  Pt is from Home, admitted with lower GI bleeding, still at risk of bleeding, diet advanced today, Eliquis restarted, we will monitor for GI bleeding till tomorrow a.m., which precludes a safe discharge. Discharge to SNF, most likely tomorrow a.m. if no GI bleeding.     Subjective: Overnight  patient had 1 episode of GI bleeding as per RN, patient  was also complaining of GI bleeding but not sure 1 or 2 times.  Patient denies any abdominal pain, no discomfort.  He denies any nausea vomiting, no abdominal pain.   Physical Exam: General: NAD, lying comfortably Appear in no distress, affect appropriate Eyes: PERRLA ENT: Oral Mucosa Clear, moist  Neck: no JVD,  Cardiovascular: S1 and S2 Present, no Murmur,  Respiratory: good respiratory effort, Bilateral Air entry equal and Decreased, no Crackles, no wheezes Abdomen: Bowel Sound present, Soft, mild LLQ and lower Abd tenderness,  Skin: no rashes Extremities: no Pedal edema, no calf tenderness Neurologic: without any new focal findings Gait not checked due to patient safety concerns  Vitals:   02/08/22 0758 02/08/22 1614 02/08/22 2332 02/09/22 0736  BP: (!) 113/50 (!) 110/55 103/75 (!) 146/74  Pulse: 72 66 69 79  Resp: '16 16 19 17  '$ Temp: 98 F (36.7 C) 97.8 F (36.6 C) 98.6 F (37 C) 97.9 F (36.6 C)  TempSrc:      SpO2: 93% 96% 94% 93%  Weight:      Height:        Intake/Output Summary (Last 24 hours) at 02/09/2022 1421 Last data filed at 02/08/2022 1800 Gross per 24 hour  Intake 680.77 ml  Output --  Net 680.77 ml   Filed Weights   02/07/22 1638  Weight: 61.2 kg    Data Reviewed: I have personally reviewed and interpreted daily labs, tele strips, imagings as discussed above. I reviewed all nursing notes, pharmacy notes, vitals, pertinent old records I have discussed plan of care as described above with RN and patient/family.  CBC: Recent Labs  Lab 02/07/22 1642 02/08/22 0020 02/08/22 0557 02/09/22 0321  WBC 15.4*  --  9.9 7.1  NEUTROABS 13.5*  --   --   --   HGB 14.5 13.3 12.9 11.9*  HCT 44.2  --  38.6 36.0  MCV 100.2*  --  98.5 100.3*  PLT 238  --  187 431   Basic Metabolic Panel: Recent Labs  Lab 02/07/22 1642 02/08/22 0557 02/09/22 0321  NA 136  --  136  K 4.8  --  4.6  CL 103  --  106  CO2 25  --  27  GLUCOSE 126*  --  93  BUN 14  --  8   CREATININE 1.00  --  0.87  CALCIUM 10.4*  --  9.3  MG  --  2.0 1.8  PHOS  --  3.1 2.7    Studies: No results found.  Scheduled Meds:  apixaban  2.5 mg Oral Q12H   cinacalcet  30 mg Oral Once per day on Mon Wed Fri   melatonin  5 mg Oral QHS   polyethylene glycol  17 g Oral Daily   psyllium  1 packet Oral Daily   rosuvastatin  5 mg Oral Daily   Continuous Infusions:  lactated ringers 75 mL/hr at 02/08/22 2249   meropenem (MERREM) IV 1 g (02/09/22 1000)   PRN Meds: acetaminophen **OR** acetaminophen, HYDROcodone-acetaminophen, morphine injection, ondansetron **OR** ondansetron (ZOFRAN) IV  Time spent: 35 minutes  Author: Val Riles. MD [facility] 02/09/2022 2:21 PM  To reach On-call, see care teams to locate the attending and reach out to them via [email or web address]. If 7PM-7AM, please contact night-coverage If you still have difficulty reaching the attending provider, please page the [facility] (Director on Call) for [facility] on amion for assistance.

## 2022-02-10 DIAGNOSIS — K921 Melena: Secondary | ICD-10-CM | POA: Diagnosis not present

## 2022-02-10 LAB — BASIC METABOLIC PANEL
Anion gap: 5 (ref 5–15)
BUN: 10 mg/dL (ref 8–23)
CO2: 27 mmol/L (ref 22–32)
Calcium: 9.7 mg/dL (ref 8.9–10.3)
Chloride: 108 mmol/L (ref 98–111)
Creatinine, Ser: 0.64 mg/dL (ref 0.44–1.00)
GFR, Estimated: >60 mL/min (ref >60)
Glucose, Bld: 105 mg/dL — ABNORMAL HIGH (ref 70–99)
Potassium: 4.2 mmol/L (ref 3.5–5.1)
Sodium: 140 mmol/L (ref 135–145)

## 2022-02-10 LAB — CBC
HCT: 36.1 % (ref 36.0–46.0)
Hemoglobin: 12 g/dL (ref 12.0–15.0)
MCH: 32.8 pg (ref 26.0–34.0)
MCHC: 33.2 g/dL (ref 30.0–36.0)
MCV: 98.6 fL (ref 80.0–100.0)
Platelets: 172 10*3/uL (ref 150–400)
RBC: 3.66 MIL/uL — ABNORMAL LOW (ref 3.87–5.11)
RDW: 11.9 % (ref 11.5–15.5)
WBC: 7 10*3/uL (ref 4.0–10.5)
nRBC: 0 % (ref 0.0–0.2)

## 2022-02-10 LAB — PHOSPHORUS: Phosphorus: 3.1 mg/dL (ref 2.5–4.6)

## 2022-02-10 LAB — MAGNESIUM: Magnesium: 1.9 mg/dL (ref 1.7–2.4)

## 2022-02-10 MED ORDER — POLYETHYLENE GLYCOL 3350 17 G PO PACK: 17.0000 g | PACK | Freq: Every day | ORAL | 0 refills | Status: AC

## 2022-02-10 MED ORDER — VITAMIN B-12 1000 MCG PO TABS
500.0000 ug | ORAL_TABLET | Freq: Every day | ORAL | Status: DC
  Administered 2022-02-10: 500 ug via ORAL
  Filled 2022-02-10: qty 1

## 2022-02-10 MED ORDER — BUTALBITAL-APAP-CAFFEINE 50-325-40 MG PO TABS
1.0000 | ORAL_TABLET | Freq: Four times a day (QID) | ORAL | Status: DC | PRN
  Administered 2022-02-10: 1 via ORAL
  Filled 2022-02-10: qty 1

## 2022-02-10 MED ORDER — CEPHALEXIN 750 MG PO CAPS: 750.0000 mg | ORAL_CAPSULE | Freq: Three times a day (TID) | ORAL | 0 refills | Status: DC

## 2022-02-10 MED ORDER — METRONIDAZOLE 500 MG PO TABS: 500.0000 mg | ORAL_TABLET | Freq: Two times a day (BID) | ORAL | 0 refills | Status: DC

## 2022-02-10 MED ORDER — CEPHALEXIN 500 MG PO CAPS: 500.0000 mg | ORAL_CAPSULE | Freq: Four times a day (QID) | ORAL | Status: DC

## 2022-02-10 MED ORDER — CYANOCOBALAMIN 500 MCG PO TABS: 500.0000 ug | ORAL_TABLET | Freq: Every day | ORAL | 2 refills | Status: AC

## 2022-02-10 MED ORDER — METRONIDAZOLE 500 MG PO TABS: 500.0000 mg | ORAL_TABLET | Freq: Two times a day (BID) | ORAL | Status: DC

## 2022-02-10 MED ORDER — PSYLLIUM 95 % PO PACK: 1.0000 | PACK | Freq: Every day | ORAL | 2 refills | Status: DC

## 2022-02-10 NOTE — Plan of Care (Signed)
  Problem: Clinical Measurements: Goal: Ability to maintain clinical measurements within normal limits will improve Outcome: Progressing Goal: Will remain free from infection Outcome: Progressing Goal: Diagnostic test results will improve Outcome: Progressing   Problem: Safety: Goal: Ability to remain free from injury will improve Outcome: Progressing

## 2022-02-10 NOTE — TOC Transition Note (Addendum)
Transition of Care [facility]) - CM/SW Discharge Note   Patient Details  Name: LARIN DEPAOLI MRN: [id number] Date of Birth: April 13, 1932  Transition of Care [facility]) CM/SW Contact:  Magnus Ivan, LCSW Phone Number: 02/10/2022, 2:16 PM   Clinical Narrative:     Patient is discharging back to [facility]F today. FL2 with DC Med list and DC Summary have been faxed to [facility] at [facility].  Also printed copy of FL2 to be sent with patient in DC packet.  Daughter to transport.    Final next level of care: Assisted Living Barriers to Discharge: Barriers Resolved   Patient Goals and CMS Choice      Discharge Placement                  Patient to be transferred to facility by: family Name of family member notified: daughter aware per LPN    Discharge Plan and Services Additional resources added to the After Visit Summary for     Discharge Planning Services: CM Consult                                 Social Determinants of Health (SDOH) Interventions SDOH Screenings   Food Insecurity: No Food Insecurity (02/07/2022)  Housing: Low Risk  (02/07/2022)  Transportation Needs: No Transportation Needs (02/07/2022)  Utilities: Not At Risk (02/07/2022)  Depression (PHQ2-9): Low Risk  (03/08/2021)  Financial Resource Strain: Low Risk  (03/08/2021)  Physical Activity: Inactive (03/08/2021)  Social Connections: Socially Isolated (03/08/2021)  Stress: No Stress Concern Present (03/08/2021)  Tobacco Use: Low Risk  (02/07/2022)     Readmission Risk Interventions     No data to display

## 2022-02-10 NOTE — NC FL2 (Signed)
[place] LEVEL OF CARE FORM     IDENTIFICATION  Patient Name: [name] Birthdate: [birth year] Sex: female Admission Date (Current Location): 02/07/2022  [facility] and [place] Number:  Engineering geologist and Address:  [facility], [address] [place], [place] [address]      Provider Number: [id number]  Attending Physician Name and Address:  Val Riles, MD  Relative Name and Phone Number:  conklin,Lisa (Daughter) [phone] (Mobile)    Current Level of Care: Hospital Recommended Level of Care: [place] Prior Approval Number:    Date Approved/Denied:   PASRR Number:    Discharge Plan: Other (Comment) (ALF)    Current Diagnoses: Patient Active Problem List   Diagnosis Date Noted   Acute colitis 02/07/2022   Hepatic steatosis 02/07/2022   History of colonic angiodysplasia, diverticulosis and internal hemorrhoids 02/07/2022   History of diverticulosis 02/07/2022   Multiple antibiotic allergies 02/07/2022   Grief 09/07/2021   Rash 12/01/2020   Vertigo 10/31/2020   Left foot pain 09/19/2020   Memory loss 08/29/2020   Primary hyperparathyroidism ([place]) 09/05/2019   DNR (do not resuscitate) 07/21/2019   Positive fecal occult blood test 08/02/2018   Chronic anticoagulation 08/02/2018   Abdominal pain 04/23/2018   Lower back pain 02/03/2018   Atrial fibrillation ([name]) 09/17/2017   Tachycardia 08/30/2017   Right foot pain 06/17/2017   Health care maintenance 06/06/2017   Advance care planning 06/06/2017   Anxiety 06/06/2017   Medicare annual wellness visit, subsequent 05/29/2017   Osteoporosis 10/08/2016   Insomnia    Frequent headaches    Hematochezia 01/06/2012   IBS (irritable bowel syndrome) 12/31/2007   CARCINOMA, BASAL CELL, SKIN 02/22/2007   HLD (hyperlipidemia) 02/22/2007   GASTROESOPHAGEAL REFLUX DISEASE 02/22/2007   CHOLECYSTECTOMY, HX OF 02/22/2007    Orientation RESPIRATION BLADDER  Height & Weight     Self, Time, Situation, Place  Normal Continent Weight: 135 lb (61.2 kg) Height:  '5\' 2"'$  (157.5 cm)  BEHAVIORAL SYMPTOMS/MOOD NEUROLOGICAL BOWEL NUTRITION STATUS      Continent Diet (heart healthy)  AMBULATORY STATUS COMMUNICATION OF NEEDS Skin   Independent Verbally Normal                       Personal Care Assistance Level of Assistance  Bathing, Dressing, Feeding Bathing Assistance: Independent Feeding assistance: Independent Dressing Assistance: Independent     Functional Limitations Info  Sight, Hearing, Speech Sight Info: Adequate Hearing Info: Adequate Speech Info: Adequate    SPECIAL CARE FACTORS FREQUENCY                       Contractures Contractures Info: Not present    Additional Factors Info  Code Status, Allergies Code Status Info: full Allergies Info: Allergies: Aspirin, Ciprofloxacin, Corticosteroids, Nexium (Esomeprazole Magnesium), Niacin And Related, Propylene Glycol, Vagifem (Estradiol), Allegra (Fexofenadine), Bacitracin-polymyxin B, Ceclor (Cefaclor), Celebrex (Celecoxib), Doxepin Hcl, Erythromycin, Flagyl (Metronidazole), Hydrocortisone, Levofloxacin, Levsin (Hyoscyamine Sulfate), Lidocaine, Macrobid (Nitrofurantoin Macrocrystal), Marcaine (Bupivacaine Hcl), Nsaids, Penicillins, Prevacid (Lansoprazole), Pseudoephedrine, Sulfonamide Derivatives, Tavist (Clemastine), Cortisone           Current Medications (02/10/2022):  Medication List       TAKE these medications     acetaminophen 325 MG tablet Commonly known as: TYLENOL Take 650 mg by mouth every 4 (four) hours as needed.    butalbital-acetaminophen-caffeine 50-325-40 MG tablet Commonly known as: FIORICET TAKE 1 TABLET BY MOUTH 2 TIMES DAILY AS NEEDED.  cephALEXin 750 MG capsule Commonly known as: KEFLEX Take 1 capsule (750 mg total) by mouth 3 (three) times daily for 7 days.    cinacalcet 30 MG tablet Commonly known as: SENSIPAR Take 1 tablet (30 mg  total) by mouth 3 (three) times a week. What changed: additional instructions    cyanocobalamin 500 MCG tablet Commonly known as: VITAMIN B12 Take 1 tablet (500 mcg total) by mouth daily. Start taking on: February 11, 2022    Debrox 6.5 % OTIC solution Generic drug: carbamide peroxide Place 5 drops into both ears as needed.    dicyclomine 10 MG capsule Commonly known as: BENTYL TAKE 1 CAPSULE BY MOUTH UP TO 4 TIMES A DAY AS NEEDED FOR ABDOMINAL CRAMPS    diphenhydrAMINE 2 % cream Commonly known as: BENADRYL Apply topically 3 (three) times daily as needed for itching.    diphenhydrAMINE 25 MG tablet Commonly known as: Benadryl Allergy Take 1 tablet (25 mg total) by mouth every 6 (six) hours as needed.    Eliquis 2.5 MG Tabs tablet Generic drug: apixaban TAKE 1 TABLET BY MOUTH TWICE A DAY    ezetimibe 10 MG tablet Commonly known as: ZETIA TAKE 1 TABLET BY MOUTH DAILY    meclizine 25 MG tablet Commonly known as: ANTIVERT Take 25 mg by mouth 3 (three) times daily as needed. For vertigo    melatonin 5 MG Tabs Take 5 mg by mouth.    metroNIDAZOLE 500 MG tablet Commonly known as: FLAGYL Take 1 tablet (500 mg total) by mouth every 12 (twelve) hours for 7 days.    Milk of Magnesia 400 MG/5ML suspension Generic drug: magnesium hydroxide Take 5 mLs by mouth as needed for mild constipation.    multivitamin tablet Take 1 tablet by mouth daily.    polyethylene glycol 17 g packet Commonly known as: MIRALAX / GLYCOLAX Take 17 g by mouth daily. Start taking on: February 11, 2022    psyllium 95 % Pack Commonly known as: HYDROCIL/METAMUCIL Take 1 packet by mouth daily. Start taking on: February 11, 2022    rosuvastatin 5 MG tablet Commonly known as: CRESTOR TAKE 1/2 TABLET BY MOUTH 5 DAYS A WEEK      Relevant Imaging Results:  Relevant Lab Results:   Additional Information SS# [id number]  Kealii Thueson E Yulitza Shorts, LCSW

## 2022-02-10 NOTE — Discharge Summary (Signed)
[facility] Discharge Summary   Patient: [name] SJG:[id number]  PCP: Tonia Ghent, MD  Date of admission: 02/07/2022   Date of discharge:  02/10/2022     Discharge Diagnoses:  Active Problems:   Hematochezia   Acute colitis   History of colonic angiodysplasia, diverticulosis and internal hemorrhoids   Atrial fibrillation (HCC)   Chronic anticoagulation   IBS (irritable bowel syndrome)   Primary hyperparathyroidism (HCC)   Hepatic steatosis   History of diverticulosis   Multiple antibiotic allergies   Admitted From: ALF Disposition:  ALF/ILF   Recommendations for Outpatient Follow-up:  F/u PCP in 1wk, repeat CBC after 1 to 2 weeks F/u GI in 1-2 weeks may need colonoscopy Follow up LABS/TEST:     Diet recommendation: Regular diet  Activity: The patient is advised to gradually reintroduce usual activities, as tolerated  Discharge Condition: stable  Code Status: Full code   History of present illness: As per the H and P dictated on admission Hospital Course:  [name] is a 89 y.o. female with medical history significant for mitral regurgitation, paroxysmal atrial fibrillation on anticoagulation ,hyperlipidemia , primary hyperparathyroidism followed by endocrinology, and multiple medication allergies, who presents to the ED with lower abdominal pain x 2 days and a bloody bowel movement while being transferred to the EMS vehicle and another following triage in the ED.  Patient has a remote hospitalization in 2013 for acute pancolitis treated successfully with antibiotics.  She had a positive Hemoccult on routine screening in 2020 and underwent colonoscopy with findings of a single nonbleeding cecal angiodysplastic lesion , moderate diverticulosis and internal hemorrhoids.  ED course and data review: Tachycardic to 122 with otherwise stable vitals.  Labs significant for WBC of 15,000, normal hemoglobin of 14.5.  CMP, lipase and UA unremarkable.EKG,  personally viewed and interpreted shows sinus rhythm at 79 with no acute ST-T wave changes CT abdomen and pelvis with contrast showed left-sided colonic wall thickening with stranding and vascular engorgement suggesting subtle colitis and few colonic diverticula among other findings as outlined as follows: IMPRESSION: Left-sided colonic wall thickening with stranding and vascular engorgement. Please correlate for subtle colitis. Few colonic diverticula. No obstruction, free air or free fluid.  Fatty liver infiltration.  Previous cholecystectomy.  Slight nodular contour of the cervix but this was seen on remote study of 2016   Patient was given a fluid bolus started on Cipro and Flagyl for colitis/diverticulitis of infectious etiology with known multiple antibiotic allergies.  Hospitalist consulted for admission.    Assessment and Plan: Acute colitis, Hematochezia. History of colonic angiodysplasia, diverticulosis and internal hemorrhoids Multiple antibiotic allergies S/p IV hydration, hemoglobin 12, remained stable.  No more lower GI bleeding. S/p Meropenem was given as patient has documented nonspecific multiple allergies.  Discussed with pharmacy and patient was started on Ancef 2 g IV every 12 hourly and Flagyl 500 mg IV every 12 hourly, patient tolerated well.  Switch to oral antibiotics Keflex 750 mg p.o. twice daily for 7 days and Flagyl 5 mg p.o. twice daily for 7 days.  Patient was seen by GI, gradually diet was advanced, tolerated well.  Resume Eliquis on 02/09/2022, tolerated well, no GI bleeding.  GI recommended to start MiraLAX and Metamucil.  Clinically patient is stable and medically optimized so plan is to discharge to ALF today and follow-up with GI as an outpatient. # Atrial fibrillation on Chronic anticoagulation, On 1/25 resumed Eliquis 2.5 mg p.o. bid as per GI, H&H remained stable,  no more GI bleeding.  Continue to watch. # Hepatic steatosis, Chronic and stable, Bili 1.3 with  otherwise normal LFTs # Primary hyperparathyroidism. Follows with endocrinology, Continue Sensipar IBS (irritable bowel syndrome) resumed dicyclomine on dc  Body mass index is 24.69 kg/m.  Interventions:   Patient was ambulatory without any assistance. On the day of the discharge the patient's vitals were stable, and no other acute medical condition were reported by patient. the patient was felt safe to be discharge at ALF/ILF.  Consultants: GI Procedures: None  Discharge Exam: General: Appear in no distress, no Rash; Oral Mucosa Clear, moist. Cardiovascular: S1 and S2 Present, no Murmur, Respiratory: normal respiratory effort, Bilateral Air entry present and no Crackles, no wheezes Abdomen: Bowel Sound present, Soft and no tenderness, no hernia Extremities: no Pedal edema, no calf tenderness Neurology: alert and oriented to time, place, and person affect appropriate.  Filed Weights   02/07/22 1638  Weight: 61.2 kg   Vitals:   02/09/22 2319 02/10/22 0732  BP: 129/65 (!) 106/56  Pulse: 83 72  Resp: 17 18  Temp: 97.9 F (36.6 C) (!) 97.2 F (36.2 C)  SpO2: 94% 94%    DISCHARGE MEDICATION: Allergies as of 02/10/2022       Reactions   Aspirin    Ciprofloxacin    Corticosteroids    Presumed cause of rash/itching.  Do not use prednisone   Nexium [esomeprazole Magnesium]    Niacin And Related    Propylene Glycol    Vagifem [estradiol]    Allegra [fexofenadine]    Bacitracin-polymyxin B    Ceclor [cefaclor]    Celebrex [celecoxib]    Doxepin Hcl    Erythromycin    Flagyl [metronidazole]    Hydrocortisone Other (See Comments)   After use of 1% HC cream, pt reported burning and feeling "scalded" in vaginal area.   Levofloxacin    Levsin [hyoscyamine Sulfate]    Lidocaine Other (See Comments)   Unknown reaction   Macrobid [nitrofurantoin Macrocrystal]    Marcaine [bupivacaine Hcl]    Nsaids    Penicillins    Prevacid [lansoprazole]    Able to tolerate  omeprazole.    Pseudoephedrine    Sulfonamide Derivatives    Tavist [clemastine]    Cortisone Rash   Any hydrocortisone cream component with severe vaginal burning        Medication List     TAKE these medications    acetaminophen 325 MG tablet Commonly known as: TYLENOL Take 650 mg by mouth every 4 (four) hours as needed.   butalbital-acetaminophen-caffeine 50-325-40 MG tablet Commonly known as: FIORICET TAKE 1 TABLET BY MOUTH 2 TIMES DAILY AS NEEDED.   cephALEXin 750 MG capsule Commonly known as: KEFLEX Take 1 capsule (750 mg total) by mouth 3 (three) times daily for 7 days.   cinacalcet 30 MG tablet Commonly known as: SENSIPAR Take 1 tablet (30 mg total) by mouth 3 (three) times a week. What changed: additional instructions   cyanocobalamin 500 MCG tablet Commonly known as: VITAMIN B12 Take 1 tablet (500 mcg total) by mouth daily. Start taking on: February 11, 2022   Debrox 6.5 % OTIC solution Generic drug: carbamide peroxide Place 5 drops into both ears as needed.   dicyclomine 10 MG capsule Commonly known as: BENTYL TAKE 1 CAPSULE BY MOUTH UP TO 4 TIMES A DAY AS NEEDED FOR ABDOMINAL CRAMPS   diphenhydrAMINE 2 % cream Commonly known as: BENADRYL Apply topically 3 (three) times daily as needed for itching.  diphenhydrAMINE 25 MG tablet Commonly known as: Benadryl Allergy Take 1 tablet (25 mg total) by mouth every 6 (six) hours as needed.   Eliquis 2.5 MG Tabs tablet Generic drug: apixaban TAKE 1 TABLET BY MOUTH TWICE A DAY   ezetimibe 10 MG tablet Commonly known as: ZETIA TAKE 1 TABLET BY MOUTH DAILY   meclizine 25 MG tablet Commonly known as: ANTIVERT Take 25 mg by mouth 3 (three) times daily as needed. For vertigo   melatonin 5 MG Tabs Take 5 mg by mouth.   metroNIDAZOLE 500 MG tablet Commonly known as: FLAGYL Take 1 tablet (500 mg total) by mouth every 12 (twelve) hours for 7 days.   Milk of Magnesia 400 MG/5ML suspension Generic drug:  magnesium hydroxide Take 5 mLs by mouth as needed for mild constipation.   multivitamin tablet Take 1 tablet by mouth daily.   polyethylene glycol 17 g packet Commonly known as: MIRALAX / GLYCOLAX Take 17 g by mouth daily. Start taking on: February 11, 2022   psyllium 95 % Pack Commonly known as: HYDROCIL/METAMUCIL Take 1 packet by mouth daily. Start taking on: February 11, 2022   rosuvastatin 5 MG tablet Commonly known as: CRESTOR TAKE 1/2 TABLET BY MOUTH 5 DAYS A WEEK       Allergies  Allergen Reactions   Aspirin    Ciprofloxacin    Corticosteroids     Presumed cause of rash/itching.  Do not use prednisone   Nexium [Esomeprazole Magnesium]    Niacin And Related    Propylene Glycol    Vagifem [Estradiol]    Allegra [Fexofenadine]    Bacitracin-Polymyxin B    Ceclor [Cefaclor]    Celebrex [Celecoxib]    Doxepin Hcl    Erythromycin    Flagyl [Metronidazole]    Hydrocortisone Other (See Comments)    After use of 1% HC cream, pt reported burning and feeling "scalded" in vaginal area.   Levofloxacin    Levsin [Hyoscyamine Sulfate]    Lidocaine Other (See Comments)    Unknown reaction   Macrobid [Nitrofurantoin Macrocrystal]    Marcaine [Bupivacaine Hcl]    Nsaids    Penicillins    Prevacid [Lansoprazole]     Able to tolerate omeprazole.    Pseudoephedrine    Sulfonamide Derivatives    Tavist [Clemastine]    Cortisone Rash    Any hydrocortisone cream component with severe vaginal burning   Discharge Instructions     Call MD for:   Complete by: As directed    GI bleeding   Call MD for:  difficulty breathing, headache or visual disturbances   Complete by: As directed    Call MD for:  extreme fatigue   Complete by: As directed    Call MD for:  persistant dizziness or light-headedness   Complete by: As directed    Call MD for:  persistant nausea and vomiting   Complete by: As directed    Call MD for:  severe uncontrolled pain   Complete by: As directed     Call MD for:  temperature >100.4   Complete by: As directed    Diet - low sodium heart healthy   Complete by: As directed    Discharge instructions   Complete by: As directed    F/u PCP in 1wk F/u GI in 1-2 weeks   Increase activity slowly   Complete by: As directed        The results of significant diagnostics from this hospitalization (including imaging, microbiology,  ancillary and laboratory) are listed below for reference.    Significant Diagnostic Studies: CT Abdomen Pelvis W Contrast  Result Date: 02/07/2022 CLINICAL DATA:  Acute abdominal pain. Stomach pain. Bright red blood per rectum. EXAM: CT ABDOMEN AND PELVIS WITH CONTRAST TECHNIQUE: Multidetector CT imaging of the abdomen and pelvis was performed using the standard protocol following bolus administration of intravenous contrast. RADIATION DOSE REDUCTION: This exam was performed according to the departmental dose-optimization program which includes automated exposure control, adjustment of the mA and/or kV according to patient size and/or use of iterative reconstruction technique. CONTRAST:  117m OMNIPAQUE IOHEXOL 300 MG/ML  SOLN COMPARISON:  CT May 2016 and older. FINDINGS: Lower chest: Heart is enlarged. There is some motion lung bases. Mild linear opacity at the bases likely scar or atelectasis. Hepatobiliary: Fatty liver infiltration. No space-occupying liver lesion. Previous cholecystectomy. Patent portal vein. Pancreas: There is some atrophy along the pancreas without obvious mass lesion or ductal dilatation. Spleen: Spleen is nonenlarged. Adrenals/Urinary Tract: Adrenal glands are preserved. No enhancing renal mass or collecting system dilatation. Prominent bilateral renal sinus fat. Tiny low-attenuation lesion laterally along the left kidney measuring 3 mm is too small to completely characterize but unchanged from the study of 2016 consistent with a benign cyst. No specific recommended imaging follow-up. The ureters are seen  with a normal course and caliber down to the bladder. Preserved contours of the urinary bladder. Stomach/Bowel: On this non oral contrast exam, the colon is nondilated. However there is wall thickening identified along the splenic flexure with slight vascular engorgement and stranding. Please correlate for colitis. There is some mild wall thickening along the distal transverse colon as well. A few colonic diverticula greatest in the sigmoid region. There is debris in the mildly distended stomach. Small bowel is nondilated. Vascular/Lymphatic: Normal caliber aorta and IVC with scattered vascular calcifications. No specific abnormal lymph node enlargement present in the abdomen and pelvis. Reproductive: Uterus is present. Slight nodular thickening along the margin of the cervix but this was seen on the prior examination in 2016. No separate adnexal mass. Other: No ascites. Slight rectus muscle diastasis with some protuberance at the level of the umbilicus. There is a small calcification in the central pelvic mesentery on image 62 which could be calcified lymph node and sequela of old infectious or inflammatory process. This was seen on the prior and has slightly more calcification today. Benign configuration. Musculoskeletal: Degenerative changes are identified along the spine and pelvis. Slight curvature of the spine. Multilevel retrolisthesis such as L1-2, T12-L1. Trace anterolisthesis of L4 on L5. IMPRESSION: Left-sided colonic wall thickening with stranding and vascular engorgement. Please correlate for subtle colitis. Few colonic diverticula. No obstruction, free air or free fluid. Fatty liver infiltration.  Previous cholecystectomy. Slight nodular contour of the cervix but this was seen on remote study of 2016 Electronically Signed   By: AJill SideM.D.   On: 02/07/2022 18:25    Microbiology: No results found for this or any previous visit (from the past 240 hour(s)).   Labs: CBC: Recent Labs  Lab  02/07/22 1642 02/08/22 0020 02/08/22 0557 02/09/22 0321 02/10/22 0435  WBC 15.4*  --  9.9 7.1 7.0  NEUTROABS 13.5*  --   --   --   --   HGB 14.5 13.3 12.9 11.9* 12.0  HCT 44.2  --  38.6 36.0 36.1  MCV 100.2*  --  98.5 100.3* 98.6  PLT 238  --  187 183 1267  Basic Metabolic Panel:  Recent Labs  Lab 02/07/22 1642 02/08/22 0557 02/09/22 0321 02/10/22 0435  NA 136  --  136 140  K 4.8  --  4.6 4.2  CL 103  --  106 108  CO2 25  --  27 27  GLUCOSE 126*  --  93 105*  BUN 14  --  8 10  CREATININE 1.00  --  0.87 0.64  CALCIUM 10.4*  --  9.3 9.7  MG  --  2.0 1.8 1.9  PHOS  --  3.1 2.7 3.1   Liver Function Tests: Recent Labs  Lab 02/07/22 1642  AST 33  ALT 16  ALKPHOS 95  BILITOT 1.3*  PROT 7.6  ALBUMIN 4.0   Recent Labs  Lab 02/07/22 1642  LIPASE 35   No results for input(s): "AMMONIA" in the last 168 hours. Cardiac Enzymes: No results for input(s): "CKTOTAL", "CKMB", "CKMBINDEX", "TROPONINI" in the last 168 hours. BNP (last 3 results) No results for input(s): "BNP" in the last 8760 hours. CBG: No results for input(s): "GLUCAP" in the last 168 hours.  Time spent: 35 minutes  Signed:  Val Riles  [facility]  02/10/2022 1:54 PM

## 2022-02-10 NOTE — Care Management Important Message (Signed)
Important Message  Patient Details  Name: [name] MRN: [id number] Date of Birth: 03-16-1932   Medicare Important Message Given:  Yes     [name] [name] 02/10/2022, 11:39 AM

## 2022-02-10 NOTE — TOC Progression Note (Addendum)
Transition of Care [facility]) - Progression Note    Patient Details  Name: [name] MRN: [id number] Date of Birth: September 01, 1932  Transition of Care [facility]) CM/SW [place], LCSW Phone Number: 02/10/2022, 9:06 AM  Clinical Narrative:    Patient to DC back to [facility] when medically ready. CSW reached out to Seth Bake at [facility] who stated they will need ALF FL2 with DC med list and RN to call report to Collier Salina at [phone] when patient is DC.  TOC will continue to follow.   11:45- Per [facility], family requesting PT/OT evals to see if patient needs rehab prior to returning to ALF. Notified MD and RN.   12:30- Per LPN, patient's daughter is comfortable with her going back to ALF without a PT eval if [facility] will allow it. Called Seth Bake at [facility] who inquired if patient can walk to and from bathroom and requested CSW call Collier Salina. Per LPN, patient is able to walk to and from bathroom independently. Left VM for Collier Salina with [facility] ALF requesting return call.   1:00- Spoke to [place] with [facility] ALF who confirms patient can come back without PT eval. He confirms he can spoken to patient and LPN and patient can return today and daughter to transport. He requested FL2 with DC Med list be faxed to [facility] at [phone]. Collier Salina denied additional needs.   Expected Discharge Plan: Assisted Living Barriers to Discharge: No Barriers Identified  Expected Discharge Plan and Services   Discharge Planning Services: CM Consult   Living arrangements for the past 2 months: Assisted Living Facility                                       Social Determinants of Health (SDOH) Interventions SDOH Screenings   Food Insecurity: No Food Insecurity (02/07/2022)  Housing: Low Risk  (02/07/2022)  Transportation Needs: No Transportation Needs (02/07/2022)  Utilities: Not At Risk (02/07/2022)  Depression (PHQ2-9): Low Risk  (03/08/2021)  Financial Resource  Strain: Low Risk  (03/08/2021)  Physical Activity: Inactive (03/08/2021)  Social Connections: Socially Isolated (03/08/2021)  Stress: No Stress Concern Present (03/08/2021)  Tobacco Use: Low Risk  (02/07/2022)    Readmission Risk Interventions     No data to display

## 2022-02-16 ENCOUNTER — Telehealth: Payer: Self-pay | Admitting: Gastroenterology

## 2022-02-16 NOTE — Telephone Encounter (Signed)
Patient  daughter called in because PT was recently hospitalized for acute colitis. They advised she needed to be seen in office but first available is not until March. Please advise.

## 2022-02-16 NOTE — Telephone Encounter (Signed)
The pt daughter [name] has agreed to an appt with Alonza Bogus PA-she has been seen in the pt in the past.  Her appt has been booked on 02/21/22 at 130 pm.

## 2022-02-17 ENCOUNTER — Encounter: Payer: Self-pay | Admitting: Family Medicine

## 2022-02-17 ENCOUNTER — Ambulatory Visit (INDEPENDENT_AMBULATORY_CARE_PROVIDER_SITE_OTHER): Payer: Medicare Other | Admitting: Family Medicine

## 2022-02-17 VITALS — BP 106/70 | HR 71 | Temp 96.0°F | Ht 62.0 in | Wt 145.0 lb

## 2022-02-17 DIAGNOSIS — R413 Other amnesia: Secondary | ICD-10-CM

## 2022-02-17 DIAGNOSIS — I480 Paroxysmal atrial fibrillation: Secondary | ICD-10-CM | POA: Diagnosis not present

## 2022-02-17 DIAGNOSIS — K529 Noninfective gastroenteritis and colitis, unspecified: Secondary | ICD-10-CM | POA: Diagnosis not present

## 2022-02-17 NOTE — Progress Notes (Unsigned)
Inpatient course d/w pt.    D/w pt about med use/allergy list.  Updated based on recent tolerances. ============================== Recommendations for Outpatient Follow-up:  F/u PCP in 1wk, repeat CBC after 1 to 2 weeks F/u GI in 1-2 weeks may need colonoscopy Follow up LABS/TEST:       Diet recommendation: Regular diet   Activity: The patient is advised to gradually reintroduce usual activities, as tolerated   Discharge Condition: stable   Code Status: Full code    History of present illness: As per the H and P dictated on admission Hospital Course:  [name] is a 89 y.o. female with medical history significant for mitral regurgitation, paroxysmal atrial fibrillation on anticoagulation ,hyperlipidemia , primary hyperparathyroidism followed by endocrinology, and multiple medication allergies, who presents to the ED with lower abdominal pain x 2 days and a bloody bowel movement while being transferred to the EMS vehicle and another following triage in the ED.  Patient has a remote hospitalization in 2013 for acute pancolitis treated successfully with antibiotics.  She had a positive Hemoccult on routine screening in 2020 and underwent colonoscopy with findings of a single nonbleeding cecal angiodysplastic lesion , moderate diverticulosis and internal hemorrhoids.  ED course and data review: Tachycardic to 122 with otherwise stable vitals.  Labs significant for WBC of 15,000, normal hemoglobin of 14.5.  CMP, lipase and UA unremarkable.EKG, personally viewed and interpreted shows sinus rhythm at 79 with no acute ST-T wave changes CT abdomen and pelvis with contrast showed left-sided colonic wall thickening with stranding and vascular engorgement suggesting subtle colitis and few colonic diverticula among other findings as outlined as follows: IMPRESSION: Left-sided colonic wall thickening with stranding and vascular engorgement. Please correlate for subtle colitis. Few colonic  diverticula. No obstruction, free air or free fluid.  Fatty liver infiltration.  Previous cholecystectomy.  Slight nodular contour of the cervix but this was seen on remote study of 2016   Patient was given a fluid bolus started on Cipro and Flagyl for colitis/diverticulitis of infectious etiology with known multiple antibiotic allergies.  Hospitalist consulted for admission.    Assessment and Plan: Acute colitis, Hematochezia. History of colonic angiodysplasia, diverticulosis and internal hemorrhoids Multiple antibiotic allergies S/p IV hydration, hemoglobin 12, remained stable.  No more lower GI bleeding. S/p Meropenem was given as patient has documented nonspecific multiple allergies.  Discussed with pharmacy and patient was started on Ancef 2 g IV every 12 hourly and Flagyl 500 mg IV every 12 hourly, patient tolerated well.  Switch to oral antibiotics Keflex 750 mg p.o. twice daily for 7 days and Flagyl 5 mg p.o. twice daily for 7 days.  Patient was seen by GI, gradually diet was advanced, tolerated well.  Resume Eliquis on 02/09/2022, tolerated well, no GI bleeding.  GI recommended to start MiraLAX and Metamucil.  Clinically patient is stable and medically optimized so plan is to discharge to ALF today and follow-up with GI as an outpatient. # Atrial fibrillation on Chronic anticoagulation, On 1/25 resumed Eliquis 2.5 mg p.o. bid as per GI, H&H remained stable, no more GI bleeding.  Continue to watch. # Hepatic steatosis, Chronic and stable, Bili 1.3 with otherwise normal LFTs # Primary hyperparathyroidism. Follows with endocrinology, Continue Sensipar IBS (irritable bowel syndrome) resumed dicyclomine on dc    Ancef 2 g IV every 12 hourly and Flagyl 500 mg IV every 12 hourly, patient tolerated well. Switch to oral antibiotics Keflex 750 mg p.o. twice daily for 7 days and Flagyl 5  mg p.o. twice daily for 7 days. Patient was seen by GI, gradually diet was advanced, tolerated well. Resume Eliquis  on 02/09/2022, tolerated well, no GI bleeding. GI recommended to start MiraLAX and Metamucil. Clinically patient is stable and medically optimized so plan is to discharge to ALF today and follow-up with GI as an outpatient.   Inpatient course discussed with patient.  Admitted with abdominal pain and blood per rectum.  She is chronically anticoagulated for history of paroxysmal atrial fibrillation.  She was treated with antibiotics as above and gradually improved to the point where she could be discharged.  She is now back at [facility].  She is not having abdominal pain or passing blood.  No fevers.  She is completing her course of antibiotics.  Discussed.  She has baseline memory loss and history is supplemented by patient's daughter.  We talked about the risk versus benefit of anticoagulation.  We talked about the risk versus benefit of stopping versus continued treatment with anticoagulation.  She has follow-up pending with GI clinic.  Meds, vitals, and allergies reviewed.   ROS: Per HPI unless specifically indicated in ROS section   GEN: nad, alert and pleasant in conversation but she does not recall recent details and needs to be redirected during the conversation. HEENT: mucous membranes moist NECK: supple w/o LA CV: rrr. PULM: ctab, no inc wob ABD: soft, +bs, not ttp EXT: no edema SKIN: Well-perfused.  30 minutes were devoted to patient care in this encounter (this includes time spent reviewing the patient's file/history, interviewing and examining the patient, counseling/reviewing plan with patient).

## 2022-02-17 NOTE — Patient Instructions (Addendum)
Go to the lab on the way out.   If you have mychart we'll likely use that to update you.    Take care.  Glad to see you. I would keep taking eliquis for now.  Let me see about options going forward.  It may be reasonable to see about using a monitor for A fib and then making a decision about eliquis.   I would finish your antibiotics and see the GI clinic as scheduled.

## 2022-02-18 LAB — CBC WITH DIFFERENTIAL/PLATELET
Absolute Monocytes: 605 cells/uL (ref 200–950)
Basophils Absolute: 38 cells/uL (ref 0–200)
Basophils Relative: 0.6 %
Eosinophils Absolute: 82 cells/uL (ref 15–500)
Eosinophils Relative: 1.3 %
HCT: 39 % (ref 35.0–45.0)
Hemoglobin: 13.6 g/dL (ref 11.7–15.5)
Lymphs Abs: 1575 cells/uL (ref 850–3900)
MCH: 33.6 pg — ABNORMAL HIGH (ref 27.0–33.0)
MCHC: 34.9 g/dL (ref 32.0–36.0)
MCV: 96.3 fL (ref 80.0–100.0)
MPV: 9.8 fL (ref 7.5–12.5)
Monocytes Relative: 9.6 %
Neutro Abs: 4001 cells/uL (ref 1500–7800)
Neutrophils Relative %: 63.5 %
Platelets: 224 10*3/uL (ref 140–400)
RBC: 4.05 10*6/uL (ref 3.80–5.10)
RDW: 12 % (ref 11.0–15.0)
Total Lymphocyte: 25 %
WBC: 6.3 10*3/uL (ref 3.8–10.8)

## 2022-02-18 LAB — COMPREHENSIVE METABOLIC PANEL
AG Ratio: 1.4 (calc) (ref 1.0–2.5)
ALT: 35 U/L — ABNORMAL HIGH (ref 6–29)
AST: 39 U/L — ABNORMAL HIGH (ref 10–35)
Albumin: 4 g/dL (ref 3.6–5.1)
Alkaline phosphatase (APISO): 72 U/L (ref 37–153)
BUN: 9 mg/dL (ref 7–25)
CO2: 22 mmol/L (ref 20–32)
Calcium: 10 mg/dL (ref 8.6–10.4)
Chloride: 102 mmol/L (ref 98–110)
Creat: 0.89 mg/dL (ref 0.60–0.95)
Globulin: 2.8 g/dL (calc) (ref 1.9–3.7)
Glucose, Bld: 121 mg/dL — ABNORMAL HIGH (ref 65–99)
Potassium: 4.4 mmol/L (ref 3.5–5.3)
Sodium: 137 mmol/L (ref 135–146)
Total Bilirubin: 0.4 mg/dL (ref 0.2–1.2)
Total Protein: 6.8 g/dL (ref 6.1–8.1)

## 2022-02-19 ENCOUNTER — Telehealth: Payer: Medicare Other | Admitting: Family Medicine

## 2022-02-19 DIAGNOSIS — I480 Paroxysmal atrial fibrillation: Secondary | ICD-10-CM

## 2022-02-19 NOTE — Telephone Encounter (Signed)
Greatly appreciate help from Dr. Angelena Form.   ===================  Angelena Form, Annita Brod, MD  You; Rodman Key, RN1 hour ago (6:57 PM)    We can set up a monitor and try to get her back in to discuss the results. Michalene, Can we call her and arrange a 30 day monitor and then f/u with me? Thanks, Gerald Stabs

## 2022-02-19 NOTE — Telephone Encounter (Signed)
Dr. Angelena Form- I need your input.    You had seen this patient in years past but not recently.  She was admitted with hematochezia.  Her hemoglobin improved in the meantime.  She still anticoagulated for paroxysmal atrial fibrillation.  We have not yet stopped anticoagulation permanently.    Can we set up ambulatory monitoring through your clinic to try to establish her atrial fibrillation burden?  That may influence her and her daughters decision going forward regarding anticoagulation  Many thanks.

## 2022-02-19 NOTE — Assessment & Plan Note (Signed)
Finishing current antibiotics.  Abdomen exam is benign.  See notes on labs.  Will follow-up with GI.

## 2022-02-19 NOTE — Assessment & Plan Note (Signed)
I am going to check with cardiology about options to try to establish her A-fib burden.  See notes on labs.  I think it makes sense to follow-up with GI in the meantime and get her labs back before we make any final decisions about Eliquis.

## 2022-02-19 NOTE — Assessment & Plan Note (Signed)
Progressive, I think it makes sense to get through the acute illness/recovery and then we can reassess.

## 2022-02-20 ENCOUNTER — Encounter: Payer: Self-pay | Admitting: Family Medicine

## 2022-02-20 NOTE — Addendum Note (Signed)
Addended by: Rodman Key on: 02/20/2022 12:29 PM   Modules accepted: Orders

## 2022-02-20 NOTE — Telephone Encounter (Addendum)
Called and spoke w daughter Mervin Kung [facility]).  Patient is at assisted living at [facility] in [place].  They have CNAs around the clock and an RN daylight M-F.  She requested the monitor be mailed to her and she will take it to the assisted living and they will help her put it on and keep it charged, check on her if the monitor company calls.   Phone numbers: RN [phone] M-F daylight                            CNA [phone] 24 hrs    Scheduled follow up appointment for about 6 weeks to review results.

## 2022-02-21 ENCOUNTER — Encounter: Payer: Self-pay | Admitting: Gastroenterology

## 2022-02-21 ENCOUNTER — Ambulatory Visit (INDEPENDENT_AMBULATORY_CARE_PROVIDER_SITE_OTHER): Payer: Medicare Other | Admitting: Gastroenterology

## 2022-02-21 ENCOUNTER — Telehealth: Payer: Self-pay | Admitting: Family Medicine

## 2022-02-21 VITALS — BP 118/64 | HR 80 | Ht 62.0 in | Wt 145.1 lb

## 2022-02-21 DIAGNOSIS — K529 Noninfective gastroenteritis and colitis, unspecified: Secondary | ICD-10-CM | POA: Diagnosis not present

## 2022-02-21 DIAGNOSIS — K588 Other irritable bowel syndrome: Secondary | ICD-10-CM

## 2022-02-21 NOTE — Telephone Encounter (Signed)
I did not call and do not see any notes of someone else calling. [name] has been notified of this.

## 2022-02-21 NOTE — Telephone Encounter (Signed)
Patient daughter [name] called in returning a call she received.

## 2022-02-21 NOTE — Patient Instructions (Addendum)
Great to see you today.  Glad you are better.  Due to recent changes in healthcare laws, you may see the results of your imaging and laboratory studies on MyChart before your provider has had a chance to review them.  We understand that in some cases there may be results that are confusing or concerning to you. Not all laboratory results come back in the same time frame and the provider may be waiting for multiple results in order to interpret others.  Please give [place] 48 hours in order for your provider to thoroughly review all the results before contacting the office for clarification of your results.   I appreciate the opportunity to care for you. Alonza Bogus, PA-C

## 2022-02-21 NOTE — Progress Notes (Signed)
02/21/2022 [name] [id number] Dec 25, 1932   HISTORY OF PRESENT ILLNESS: This is an 89 year old female who is a patient of Dr. Lynne Leader.  She is here for recent hospital follow-up.  She was hospitalized from 02/07/2022 to 02/10/2022 for diagnosis of acute colitis.  CT scan of the abdomen and pelvis with contrast on 02/07/22 showed the following:  IMPRESSION: Left-sided colonic wall thickening with stranding and vascular engorgement. Please correlate for subtle colitis. Few colonic diverticula. No obstruction, free air or free fluid.   Fatty liver infiltration.  Previous cholecystectomy.   Slight nodular contour of the cervix but this was seen on remote study of 2016.  Her main symptoms were abdominal pain and rectal bleeding.  She was treated with a course of antibiotics, which she just completed on Friday.  Rectal bleeding has resolved.  Her acute abdominal pain has also resolved.  She always has some underlying abdominal pain related to her IBS.  She was also started on MiraLAX daily and some powder fiber supplement daily while in the hospital so she is hopeful that that will help her move her bowels better as she tends to have some underlying constipation.  Has Bentyl to use as needed for abdominal pain as well.  She is on Eliquis, but they are going to do some heart monitoring at the cardiologist to determine if they really think that she needs to continue that.  Her weight has been stable per her daughter's report.  Labs from yesterday show normal white blood cell count and normal hemoglobin of 13.6 g.  Colonoscopy 08/2018: Moderate diverticulosis seen throughout the entire colon, internal hemorrhoids, single nonbleeding angiodysplastic lesion.   Past Medical History:  Diagnosis Date   Anxiety    AR (allergic rhinitis)    Atrial fibrillation (HCC)    Basal cell carcinoma of ala nasi 2014   Benign neoplasm stomach body    Cataract    corrected with surgery   Colitis     CTS (carpal tunnel syndrome)    Diverticulitis    Diverticulosis    Esophageal stricture    Frequent headaches    likely migraine   GERD (gastroesophageal reflux disease)    Hepatic steatosis    Hyperlipidemia    Hypertension    IBS (irritable bowel syndrome)    Insomnia    Internal hemorrhoids    Osteopenia    Past Surgical History:  Procedure Laterality Date   CATARACT EXTRACTION, BILATERAL     August 2018, October 2018   CHOLECYSTECTOMY     MOHS SURGERY  2014   nose    reports that she has never smoked. She has never used smokeless tobacco. She reports that she does not drink alcohol and does not use drugs. family history includes Breast cancer in her sister; CAD in her brother and mother; Cancer in an other family member; Diabetes in an other family member; Leukemia in her father; Mesothelioma in her brother; Pancreatic cancer in her brother. Allergies  Allergen Reactions   Aspirin    Ciprofloxacin    Corticosteroids     Presumed cause of rash/itching.  Do not use prednisone   Nexium [Esomeprazole Magnesium]    Niacin And Related    Propylene Glycol    Vagifem [Estradiol]    Allegra [Fexofenadine]    Bacitracin-Polymyxin B    Celebrex [Celecoxib]    Doxepin Hcl    Erythromycin    Hydrocortisone Other (See Comments)    After use of 1% HC  cream, pt reported burning and feeling "scalded" in vaginal area.   Levofloxacin    Levsin [Hyoscyamine Sulfate]    Lidocaine Other (See Comments)    Unknown reaction   Macrobid [Nitrofurantoin Macrocrystal]    Marcaine [Bupivacaine Hcl]    Nsaids    Prevacid [Lansoprazole]     Able to tolerate omeprazole.    Pseudoephedrine    Sulfonamide Derivatives    Tavist [Clemastine]    Cortisone Rash    Any hydrocortisone cream component with severe vaginal burning      Outpatient Encounter Medications as of 02/21/2022  Medication Sig   acetaminophen (TYLENOL) 325 MG tablet Take 650 mg by mouth every 4 (four) hours as needed.    butalbital-acetaminophen-caffeine (FIORICET) 50-325-40 MG tablet TAKE 1 TABLET BY MOUTH 2 TIMES DAILY AS NEEDED.   cinacalcet (SENSIPAR) 30 MG tablet Take 1 tablet (30 mg total) by mouth 3 (three) times a week.   cyanocobalamin (VITAMIN B12) 500 MCG tablet Take 1 tablet (500 mcg total) by mouth daily.   dicyclomine (BENTYL) 10 MG capsule TAKE 1 CAPSULE BY MOUTH UP TO 4 TIMES A DAY AS NEEDED FOR ABDOMINAL CRAMPS   diphenhydrAMINE (BENADRYL ALLERGY) 25 MG tablet Take 1 tablet (25 mg total) by mouth every 6 (six) hours as needed.   diphenhydrAMINE (BENADRYL) 2 % cream Apply topically 3 (three) times daily as needed for itching.   ELIQUIS 2.5 MG TABS tablet TAKE 1 TABLET BY MOUTH TWICE A DAY   ezetimibe (ZETIA) 10 MG tablet TAKE 1 TABLET BY MOUTH DAILY   magnesium hydroxide (MILK OF MAGNESIA) 400 MG/5ML suspension Take 5 mLs by mouth as needed for mild constipation.   meclizine (ANTIVERT) 25 MG tablet Take 25 mg by mouth 3 (three) times daily as needed. For vertigo   melatonin 5 MG TABS Take 5 mg by mouth.   Multiple Vitamin (MULTIVITAMIN) tablet Take 1 tablet by mouth daily.   polyethylene glycol (MIRALAX / GLYCOLAX) 17 g packet Take 17 g by mouth daily.   psyllium (HYDROCIL/METAMUCIL) 95 % PACK Take 1 packet by mouth daily.   rosuvastatin (CRESTOR) 5 MG tablet TAKE 1/2 TABLET BY MOUTH 5 DAYS A WEEK   No facility-administered encounter medications on file as of 02/21/2022.    REVIEW OF SYSTEMS  : All other systems reviewed and negative except where noted in the History of Present Illness.   PHYSICAL EXAM: BP 118/64   Pulse 80   Ht '5\' 2"'$  (1.575 m)   Wt 145 lb 2 oz (65.8 kg)   LMP 01/17/1988 (Approximate)   BMI 26.54 kg/m  General: Well developed white female in no acute distress Head: Normocephalic and atraumatic Eyes:  Sclerae anicteric, conjunctiva pink. Ears: Normal auditory acuity Lungs: Clear throughout to auscultation; no W/R/R. Heart: Regular rate and rhythm; no M/R/G. Abdomen:  Soft, non-distended.  BS present.  Mild diffuse TTP. Musculoskeletal: Symmetrical with no gross deformities  Skin: No lesions on visible extremities Extremities: No edema  Neurological: Alert oriented x 4, grossly non-focal Psychological:  Alert and cooperative. Normal mood and affect  ASSESSMENT AND PLAN: *Acute colitis, likely either infectious or ischemic in nature described as left-sided colonic wall thickening with stranding and vascular engorgement on CT scan.  Was treated with a course of antibiotics.  Complaints were more so abdominal pain and rectal bleeding, making consistent with may be some ischemia.  She has some chronic underlying abdominal pain related to her IBS, but the acute pain has resolved and she has had  no more rectal bleeding.  No diarrhea.  She had a colonoscopy in 2020.  Would not proceed with colonoscopy at this time.  Her daughter and patient are in agreement. *IBS-C: During hospital stay they added MiraLAX 1 packet daily and a dose of powder fiber supplement daily as well.  Has Bentyl to use prn as well.   CC:  Tonia Ghent, MD

## 2022-02-24 ENCOUNTER — Telehealth: Payer: Self-pay | Admitting: Family Medicine

## 2022-02-24 NOTE — Telephone Encounter (Signed)
Patients daughter called in to update note tom let Damita Dunnings that [facility] gave her diflucan,for the itching,on Wednesday,and did a urine sample. She is scheduled to have another tablet today,she would like to know if she should wait to see if that will help her?

## 2022-02-24 NOTE — Telephone Encounter (Signed)
Patient daughter called in and stated that [name] is taking an antibiotic. She stated that she is now experiencing some vaginal itching and was wondering if something could be sent in for that. Please advise. Thank you!

## 2022-02-24 NOTE — Telephone Encounter (Signed)
Tried to call but no answer and vm not set up

## 2022-02-24 NOTE — Telephone Encounter (Signed)
If the first dose of diflucan didn't resolve the issue, and if she is due for the next dose of diflucan, then reasonable to take the 2nd dose.  Thanks.

## 2022-02-27 ENCOUNTER — Ambulatory Visit: Payer: Medicare Other | Attending: Cardiovascular Disease

## 2022-02-27 DIAGNOSIS — I480 Paroxysmal atrial fibrillation: Secondary | ICD-10-CM

## 2022-03-01 NOTE — Telephone Encounter (Signed)
Tried to call but no answer and vm not set up

## 2022-03-05 ENCOUNTER — Emergency Department
Admission: EM | Admit: 2022-03-05 | Discharge: 2022-03-06 | Disposition: A | Discharge status: Skilled Nursing Facility | Payer: Medicare Other | Attending: Emergency Medicine | Admitting: Emergency Medicine

## 2022-03-05 ENCOUNTER — Other Ambulatory Visit: Payer: Self-pay

## 2022-03-05 ENCOUNTER — Emergency Department: Payer: Medicare Other

## 2022-03-05 DIAGNOSIS — R079 Chest pain, unspecified: Secondary | ICD-10-CM | POA: Insufficient documentation

## 2022-03-05 DIAGNOSIS — I10 Essential (primary) hypertension: Secondary | ICD-10-CM | POA: Diagnosis not present

## 2022-03-05 DIAGNOSIS — F0390 Unspecified dementia without behavioral disturbance: Secondary | ICD-10-CM | POA: Insufficient documentation

## 2022-03-05 DIAGNOSIS — R0789 Other chest pain: Secondary | ICD-10-CM | POA: Diagnosis not present

## 2022-03-05 DIAGNOSIS — I491 Atrial premature depolarization: Secondary | ICD-10-CM | POA: Diagnosis not present

## 2022-03-05 LAB — BASIC METABOLIC PANEL
Anion gap: 10 (ref 5–15)
BUN: 9 mg/dL (ref 8–23)
CO2: 21 mmol/L — ABNORMAL LOW (ref 22–32)
Calcium: 10.2 mg/dL (ref 8.9–10.3)
Chloride: 102 mmol/L (ref 98–111)
Creatinine, Ser: 0.7 mg/dL (ref 0.44–1.00)
GFR, Estimated: >60 mL/min (ref >60)
Glucose, Bld: 101 mg/dL — ABNORMAL HIGH (ref 70–99)
Potassium: 4.2 mmol/L (ref 3.5–5.1)
Sodium: 133 mmol/L — ABNORMAL LOW (ref 135–145)

## 2022-03-05 LAB — CBC
HCT: 41.5 % (ref 36.0–46.0)
Hemoglobin: 13.8 g/dL (ref 12.0–15.0)
MCH: 32.9 pg (ref 26.0–34.0)
MCHC: 33.3 g/dL (ref 30.0–36.0)
MCV: 98.8 fL (ref 80.0–100.0)
Platelets: 221 10*3/uL (ref 150–400)
RBC: 4.2 MIL/uL (ref 3.87–5.11)
RDW: 12.4 % (ref 11.5–15.5)
WBC: 6.4 10*3/uL (ref 4.0–10.5)
nRBC: 0 % (ref 0.0–0.2)

## 2022-03-05 LAB — TROPONIN I (HIGH SENSITIVITY): Troponin I (High Sensitivity): 5 ng/L (ref <18)

## 2022-03-05 MED ORDER — FLUCONAZOLE 50 MG PO TABS
150.0000 mg | ORAL_TABLET | Freq: Once | ORAL | Status: AC
  Administered 2022-03-05: 150 mg via ORAL
  Filled 2022-03-05: qty 1

## 2022-03-05 NOTE — ED Notes (Signed)
Pt ambulated to the bathroom within her room at this time. Skid-free socks applied prior to ambulating. Pt reconnected to monitoring devices. Pts family member at bedside. Pt denied needing anything else at this time.

## 2022-03-05 NOTE — ED Notes (Signed)
Per pt family, patient has dementia and will forget after 5 minutes and will need to be reminded what is going on. Pt did state that "I am ready to go and I do not want to prolong anything here."

## 2022-03-05 NOTE — ED Provider Notes (Signed)
[facility] Provider Note    Event Date/Time   First MD Initiated Contact with Patient 03/05/22 2145     (approximate)  History   Chief Complaint: Chest Pain  HPI  [name] is a 89 y.o. female with a past medical history of hypertension, hyperlipidemia, atrial fibrillation, dementia, presents to the emergency department for chest pain.  According to EMS staff reports approximate 30 minutes prior to arrival patient complained of chest pain.  Here the patient does not remember complaining of chest pain, has no complaints currently denies any chest pain or shortness of breath.  Patient appears well.  Friend/family member is here with the patient who states the patient was complaining of some vaginal itching recently and recently underwent a course of antibiotics.  We will prescribe a one-time dose of Diflucan.  Patient denies any symptoms currently.  Physical Exam   Triage Vital Signs: ED Triage Vitals  Enc Vitals Group     BP 03/05/22 2133 (!) 166/146     Pulse Rate 03/05/22 2133 63     Resp 03/05/22 2133 (!) 23     Temp 03/05/22 2133 (!) 97.5 F (36.4 C)     Temp Source 03/05/22 2133 Oral     SpO2 03/05/22 2133 97 %     Weight 03/05/22 2138 140 lb (63.5 kg)     Height 03/05/22 2138 5' 2"$  (1.575 m)     Head Circumference --      Peak Flow --      Pain Score 03/05/22 2133 0     Pain Loc --      Pain Edu? --      Excl. in [place]? --     Most recent vital signs: Vitals:   03/05/22 2133  BP: (!) 166/146  Pulse: 63  Resp: (!) 23  Temp: (!) 97.5 F (36.4 C)  SpO2: 97%    General: Awake, no distress.  CV:  Good peripheral perfusion.  Regular rate and rhythm  Resp:  Normal effort.  Equal breath sounds bilaterally.  Abd:  No distention.  Soft, nontender.  No rebound or guarding.  ED Results / Procedures / Treatments   EKG  EKG viewed and interpreted by myself shows a normal sinus rhythm at 65 bpm with a narrow QRS, normal axis, normal  intervals, no concerning ST changes.  RADIOLOGY  Chest x-ray viewed and interpreted by myself shows no obvious consolidation on my evaluation. Radiology is read the chest x-ray is negative.   MEDICATIONS ORDERED IN ED: Medications  fluconazole (DIFLUCAN) tablet 150 mg (has no administration in time range)     IMPRESSION / MDM / ASSESSMENT AND PLAN / ED COURSE  I reviewed the triage vital signs and the nursing notes.  Patient's presentation is most consistent with acute presentation with potential threat to life or bodily function.  Patient presents emergency department for chest pain.  Patient does not recall having chest pain.  Denies any chest pain currently denies any shortness of breath denies any cough or congestion.  Overall the patient appears well.  Given the report of chest pain we will check labs including cardiac enzymes and obtain a chest x-ray.  EKG does not appear to show any concerning findings.  Given the onset of symptoms just before arrival we will repeat the enzymes after 2 hours.  If the patient's enzymes and labs are reassuring I anticipate the patient could be discharged home with PCP follow-up.  Patient currently has  a Holter monitor in place, per family member this is to see if the patient is going into atrial fibrillation at all to hopefully come off anticoagulation.  Patient was recently on antibiotics for colitis, family member states the patient was complaining of some vaginal itching recently, patient does not recall.  We will dose a one-time dose of Diflucan as a precaution.  Patient's chest x-ray is negative.  Patient CBC is normal, chemistry is reassuring and troponin is negative.  Patient continues to have no complaints.  We will repeat a troponin as a precaution.  If the patient's repeat troponin remains negative anticipate likely discharge home.  Patient care signed out to oncoming provider.  FINAL CLINICAL IMPRESSION(S) / ED DIAGNOSES   Chest  pain   Note:  This document was prepared using Dragon voice recognition software and may include unintentional dictation errors.   Harvest Dark, MD 03/05/22 2248

## 2022-03-05 NOTE — Discharge Instructions (Signed)
Your workup in the emergency department today has shown normal results.  Please follow-up with your doctor for recheck/reevaluation.  Return to the emergency department for any chest pain, trouble breathing, or any other symptom personally concerning to yourself or staff members.

## 2022-03-05 NOTE — ED Triage Notes (Signed)
Pt arrived via EMS from [facility] with c/o chest pain that started about 30 mins ago that is not radiating, but in the center of her chest and sharp. Pt is not complaining of chest pain during triage. Pt is wearing a heart monitor and has been for the last 6 days, per EMS they are trying to wean her off of her elequis.  Pt has had no falls or recent trauma, and not sick recently. A&Ox4.   Vitals en route were  BP178/82 98%HR 76  324 mg of asa en route.

## 2022-03-06 ENCOUNTER — Encounter: Payer: Self-pay | Admitting: Family Medicine

## 2022-03-06 LAB — TROPONIN I (HIGH SENSITIVITY): Troponin I (High Sensitivity): 5 ng/L (ref <18)

## 2022-03-06 NOTE — ED Provider Notes (Signed)
Second opponent negative.  No new concerns.  We will discharge per original plan of care   Vladimir Crofts, MD 03/06/22 [phone]

## 2022-03-09 ENCOUNTER — Telehealth: Payer: Self-pay

## 2022-03-09 DIAGNOSIS — B351 Tinea unguium: Secondary | ICD-10-CM | POA: Diagnosis not present

## 2022-03-09 DIAGNOSIS — I7091 Generalized atherosclerosis: Secondary | ICD-10-CM | POA: Diagnosis not present

## 2022-03-09 NOTE — Telephone Encounter (Signed)
        Patient  visited [facility] on 2/19   Telephone encounter attempt :  1st  Unable to IXL, [place] [phone] [address], [place], [place] [address] Phone: [phone] Email: [email or web address]

## 2022-03-10 ENCOUNTER — Encounter: Payer: Self-pay | Admitting: Family Medicine

## 2022-03-10 ENCOUNTER — Ambulatory Visit (INDEPENDENT_AMBULATORY_CARE_PROVIDER_SITE_OTHER): Payer: Medicare Other | Admitting: Family Medicine

## 2022-03-10 ENCOUNTER — Telehealth: Payer: Self-pay

## 2022-03-10 VITALS — BP 110/60 | HR 90 | Temp 97.7°F | Ht 62.0 in | Wt 140.0 lb

## 2022-03-10 DIAGNOSIS — R0789 Other chest pain: Secondary | ICD-10-CM

## 2022-03-10 MED ORDER — PEPTO-BISMOL 262 MG/15ML PO SUSP: 30.0000 mL | Freq: Three times a day (TID) | ORAL | Status: AC | PRN

## 2022-03-10 NOTE — Telephone Encounter (Signed)
     Patient  visit on 2/19  at [facility]   Have you been able to follow up with your primary care physician? Yes   The patient was or was not able to obtain any needed medicine or equipment. Yes   Are there diet recommendations that you are having difficulty following? Na   Patient expresses understanding of discharge instructions and education provided has no other needs at this time.  yes     [place] [phone] [address], [place], [place] [address] Phone: [phone] Email: [email or web address]

## 2022-03-10 NOTE — Patient Instructions (Addendum)
Update me about bentyl use.  Let me know about that and we'll go from there.   Try using pepto bismol if you have stomach pain in the meantime.    Take care.  Glad to see you.

## 2022-03-10 NOTE — Progress Notes (Unsigned)
Zio patch in place.  D/w pt about rationale for use, to see about possible anticoagulation cessation.    She had vaginal itching with dose of diflucan given at ER.  Sx resolved in the meantime.   Walking without CP at [facility].    BM this AM or last PM.  She is on miralax at baseline.      Chest wall pain from monitor? Chest pain? Abd pain that referred?

## 2022-03-12 DIAGNOSIS — R0789 Other chest pain: Secondary | ICD-10-CM | POA: Insufficient documentation

## 2022-03-12 NOTE — Assessment & Plan Note (Signed)
See above regarding differential.  Would continue with Zio patch for now.  Skin does not appear irritated at the monitor site. I asked the patient's daughter to update me about bentyl use.  Unclear if that contributed to recent symptoms, prior to ER evaluation. Reasonable to try using pepto bismol if having stomach pain in the meantime.   Benign exam today.

## 2022-03-17 ENCOUNTER — Telehealth: Payer: Self-pay | Admitting: Cardiovascular Disease

## 2022-03-17 NOTE — Telephone Encounter (Addendum)
Noted.  Will let Dr. Angelena Form know as well.  Reason for monitor: Message from patient's PCP to Dr. Angelena Form  Dr. Angelena Form- I need your input.     You had seen this patient in years past but not recently.  She was admitted with hematochezia.  Her hemoglobin improved in the meantime.  She still anticoagulated for paroxysmal atrial fibrillation.  We have not yet stopped anticoagulation permanently.     Can we set up ambulatory monitoring through your clinic to try to establish her atrial fibrillation burden?  That may influence her and her daughters decision going forward regarding anticoagulation   Many thanks.

## 2022-03-17 NOTE — Telephone Encounter (Signed)
Pt daughter states pt took her heart monitor off because it was bothering her. She states pt is in assisted living and RN told her past wore it for 16 days. Please advise

## 2022-03-17 NOTE — Telephone Encounter (Signed)
Pt's daughter is calling to follow up on the heart monitor

## 2022-03-17 NOTE — Telephone Encounter (Signed)
Returned call to patient's daughter.  She was wondering if they should re apply the monitor or send it back in.   She will ask the facility to leave the monitor off through the weekend and reapply next week.  I also suggested that she contact the monitor company to see if there are hypoallergic options.

## 2022-03-24 ENCOUNTER — Emergency Department
Admission: EM | Admit: 2022-03-24 | Discharge: 2022-03-24 | Disposition: A | Discharge status: Skilled Nursing Facility | Payer: Medicare Other | Attending: Emergency Medicine | Admitting: Emergency Medicine

## 2022-03-24 ENCOUNTER — Other Ambulatory Visit: Payer: Self-pay

## 2022-03-24 ENCOUNTER — Emergency Department: Payer: Medicare Other

## 2022-03-24 DIAGNOSIS — I10 Essential (primary) hypertension: Secondary | ICD-10-CM | POA: Diagnosis not present

## 2022-03-24 DIAGNOSIS — F0390 Unspecified dementia without behavioral disturbance: Secondary | ICD-10-CM | POA: Insufficient documentation

## 2022-03-24 DIAGNOSIS — Z7901 Long term (current) use of anticoagulants: Secondary | ICD-10-CM | POA: Insufficient documentation

## 2022-03-24 DIAGNOSIS — Z20822 Contact with and (suspected) exposure to covid-19: Secondary | ICD-10-CM | POA: Diagnosis not present

## 2022-03-24 DIAGNOSIS — R0789 Other chest pain: Secondary | ICD-10-CM | POA: Insufficient documentation

## 2022-03-24 DIAGNOSIS — R079 Chest pain, unspecified: Secondary | ICD-10-CM | POA: Diagnosis not present

## 2022-03-24 LAB — CBC
HCT: 40 % (ref 36.0–46.0)
Hemoglobin: 13.4 g/dL (ref 12.0–15.0)
MCH: 33.4 pg (ref 26.0–34.0)
MCHC: 33.5 g/dL (ref 30.0–36.0)
MCV: 99.8 fL (ref 80.0–100.0)
Platelets: 198 10*3/uL (ref 150–400)
RBC: 4.01 MIL/uL (ref 3.87–5.11)
RDW: 12.2 % (ref 11.5–15.5)
WBC: 5.3 10*3/uL (ref 4.0–10.5)
nRBC: 0 % (ref 0.0–0.2)

## 2022-03-24 LAB — BASIC METABOLIC PANEL
Anion gap: 8 (ref 5–15)
BUN: 9 mg/dL (ref 8–23)
CO2: 23 mmol/L (ref 22–32)
Calcium: 10 mg/dL (ref 8.9–10.3)
Chloride: 106 mmol/L (ref 98–111)
Creatinine, Ser: 0.74 mg/dL (ref 0.44–1.00)
GFR, Estimated: >60 mL/min (ref >60)
Glucose, Bld: 122 mg/dL — ABNORMAL HIGH (ref 70–99)
Potassium: 4.1 mmol/L (ref 3.5–5.1)
Sodium: 137 mmol/L (ref 135–145)

## 2022-03-24 LAB — HEPATIC FUNCTION PANEL
ALT: 16 U/L (ref 0–44)
AST: 22 U/L (ref 15–41)
Albumin: 3.8 g/dL (ref 3.5–5.0)
Alkaline Phosphatase: 69 U/L (ref 38–126)
Bilirubin, Direct: <0.1 mg/dL (ref 0.0–0.2)
Total Bilirubin: 0.6 mg/dL (ref 0.3–1.2)
Total Protein: 7 g/dL (ref 6.5–8.1)

## 2022-03-24 LAB — RESP PANEL BY RT-PCR (RSV, FLU A&B, COVID)  RVPGX2
Influenza A by PCR: NEGATIVE
Influenza B by PCR: NEGATIVE
Resp Syncytial Virus by PCR: NEGATIVE
SARS Coronavirus 2 by RT PCR: NEGATIVE

## 2022-03-24 LAB — TROPONIN I (HIGH SENSITIVITY)
Troponin I (High Sensitivity): 4 ng/L (ref <18)
Troponin I (High Sensitivity): 4 ng/L (ref <18)

## 2022-03-24 LAB — LIPASE, BLOOD: Lipase: 37 U/L (ref 11–51)

## 2022-03-24 NOTE — ED Notes (Signed)
This RN has called [facility] ([facility]) multiple times with no answer. Granddaughter sts that her mother (pt's daughter) had already spoke with someone from [facility] telling them that the pt was going home. Charge RN sts to dc pt since she is going to be brought home by granddaughter.

## 2022-03-24 NOTE — ED Provider Notes (Signed)
[facility] Provider Note    Event Date/Time   First MD Initiated Contact with Patient 03/24/22 2025     (approximate)   History   Chest Pain   HPI  [name] is a 89 y.o. female with hypertension, hyperlipidemia, dementia, A-fib on Eliquis who comes in with concern for some chest pain.  Patient was reportedly at her facility when she was eating a salad and then developed some chest pain.  Is unclear the exact timing of it but sometime after dinner.  She told the staff who brought her into the emergency room to be evaluated.  She denies any chest pain or shortness of breath currently.  She does have some dementia  unable to tell me when the chest pain started or any specific description of the chest pain but does report that the pain has since all resolved and points to the middle of her chest.    Physical Exam   Triage Vital Signs: ED Triage Vitals  Enc Vitals Group     BP 03/24/22 1958 (!) 145/84     Pulse Rate 03/24/22 1958 65     Resp 03/24/22 1958 16     Temp 03/24/22 1958 97.9 F (36.6 C)     Temp Source 03/24/22 1958 Oral     SpO2 03/24/22 1958 95 %     Weight 03/24/22 1956 135 lb (61.2 kg)     Height 03/24/22 1956 '5\' 2"'$  (1.575 m)     Head Circumference --      Peak Flow --      Pain Score 03/24/22 1956 0     Pain Loc --      Pain Edu? --      Excl. in [place]? --     Most recent vital signs: Vitals:   03/24/22 1958 03/24/22 2030  BP: (!) 145/84 (!) 135/58  Pulse: 65 62  Resp: 16 18  Temp: 97.9 F (36.6 C)   SpO2: 95% 95%     General: Awake, no distress.  CV:  Good peripheral perfusion.  Resp:  Normal effort.  Abd:  No distention.  Other:  Good equal radial pulses.  No chest wall tenderness.   ED Results / Procedures / Treatments   Labs (all labs ordered are listed, but only abnormal results are displayed) Labs Reviewed  BASIC METABOLIC PANEL - Abnormal; Notable for the following components:      Result Value    Glucose, Bld 122 (*)    All other components within normal limits  CBC  HEPATIC FUNCTION PANEL  LIPASE, BLOOD  TROPONIN I (HIGH SENSITIVITY)     EKG  My interpretation of EKG:  Normal sinus rate of 69 without any ST elevation or T wave inversions, normal levels  RADIOLOGY I have reviewed the xray personally and interpreted and no evidence of any pneumonia  PROCEDURES:  Critical Care performed: No  .1-3 Lead EKG Interpretation  Performed by: Vanessa Benbrook, MD Authorized by: Vanessa Roland, MD     Interpretation: normal     ECG rate:  60   ECG rate assessment: normal     Rhythm: sinus rhythm     Ectopy: none     Conduction: normal      MEDICATIONS ORDERED IN ED: Medications - No data to display   IMPRESSION / MDM / [place] / ED COURSE  I reviewed the triage vital signs and the nursing notes.   Patient's presentation  is most consistent with acute presentation with potential threat to life or bodily function.   Differential is ACS, acid reflux.  Low suspicion for PE given on Eliquis.  Chest x-ray to evaluate for any pneumonia.  I did evidence of liver test just to make sure not related to the gallbladder.  However she does not seem to be tender in her upper abdomen at this time.  BMP is reassuring.  CBC reassuring.  Troponin is negative  Troponin is negative.  COVID flu were negative.  Hepatic function and lipase were reassuring  Considered admission but I reevaluated patient and she continues to not have any discomfort cardiac markers are negative x 2.  Discussed with patient and her POA the daughter and they feel comfortable with discharge home will return to the ER if she develops return of chest pain or any other concerns otherwise she can follow-up outpatient with her cardiologist.  The patient is on the cardiac monitor to evaluate for evidence of arrhythmia and/or significant heart rate changes.      FINAL CLINICAL IMPRESSION(S) / ED DIAGNOSES    Final diagnoses:  Atypical chest pain     Rx / DC Orders   ED Discharge Orders     None        Note:  This document was prepared using Dragon voice recognition software and may include unintentional dictation errors.   Vanessa Lodoga, MD 03/24/22 [phone]

## 2022-03-24 NOTE — ED Triage Notes (Signed)
CP about an hr ago, non-radiating, hc Afib, at present feel pressure is feeling better.  New tremor to right hand Allergy to aspirin  EMS did not administered any meds.  From [facility] facility.

## 2022-03-24 NOTE — ED Notes (Addendum)
Pt has DNR Order with chart

## 2022-03-24 NOTE — ED Triage Notes (Signed)
Pt presents to ER via EMS, with central chest pain that does not radiate. Pt denies any shortness of breath. Upon arrival to ER pt reports pressure is feeling better, pt thinks it may be indigestion. Pt talks in complete sentences no respiratory distress noted.  EMS did not administered any aspirin as face sheet from living facility reports pt has allergy to NSAIDS and aspirin.

## 2022-03-24 NOTE — ED Notes (Signed)
Pt brought to ed rm 2 at this time, this RN now assuming care.

## 2022-03-24 NOTE — ED Notes (Signed)
ED Provider at bedside. 

## 2022-03-24 NOTE — Discharge Instructions (Addendum)
Her workup was reassuring without any evidence of heart attack or pneumonia.  She can follow-up with her cardiologist to discuss further workup and return to the ER if she develops return of symptoms or any other concerns

## 2022-03-24 NOTE — ED Notes (Signed)
Pt's granddaughter verbalized understanding of DC instructions. Signing pad did not work.

## 2022-03-26 ENCOUNTER — Telehealth: Payer: Self-pay | Admitting: Family Medicine

## 2022-03-26 NOTE — Telephone Encounter (Signed)
Please see below.  I got an update from cardiology.  Her monitor did not show atrial fibrillation or flutter.  Dr. Angelena Form and I both think it makes sense to stop Eliquis.  We think this is the best option.  I took it off her med list in the meantime.  Please let me know if patient/daughter object.    Assuming they consent, please send order to discontinue Eliquis.  Thanks.  ============================  Burnell Blanks, MD  Tonia Ghent, MD; Rodman Key, RN Brigitte Pulse, Her monitor does not show atrial fib/flutter. Given her age and recent issues with GI bleeding and anemia, I am in support of stopping her Eliquis. Let me know if you want me to discuss this with her. Thanks, Gerald Stabs

## 2022-03-27 NOTE — Telephone Encounter (Signed)
Spoke with patients daughter and discussed stopping eliquis. Discontinued order has been sent to [facility].

## 2022-03-29 ENCOUNTER — Telehealth: Payer: Self-pay

## 2022-03-29 NOTE — Telephone Encounter (Signed)
     Patient  visit on 3/8  at [facility]   Have you been able to follow up with your primary care physician? Yes   The patient was or was not able to obtain any needed medicine or equipment. Yes   Are there diet recommendations that you are having difficulty following? Na   Patient expresses understanding of discharge instructions and education provided has no other needs at this time.  Yes      Jasha Hodzic [facility], [place] [phone] [address], [place], [place] [address] Phone: [phone] Email: [email or web address]    

## 2022-04-02 NOTE — Progress Notes (Signed)
No chief complaint on file.   History of Present Illness: 89 y.o. female with history of mitral regurgitation, paroxysmal atrial fibrillation and hyperlipidemia who is here today for cardiac follow up. She was seen as a virtual e-visit due to the [place] pandemic in May 2020. She was seen as a new patient for initial evaluation on November 2019 by Dr. Buford Dresser. She had been seen previously by Dr Wynonia Lawman. Echo in his office in September 2019 showed normal LV systolic function with [id number] with moderate MR. She was in sinus on the day of her first visit here. She was continued on Lopressor and Eliquis. She was felt to be appropriate for 5 mg of Eliquis BID. CHADS VASC score 3. Last echo October 2021 with LVEF=60-65%. Mild mitral regurgitation. She had GI bleeding in February 2024 and was felt to have acute colitis. She has been followed by GI. No known atrial fibrillation for years. Cardiac monitor with no evidence of atrial fibrillation so Eliquis was stopped after discussions with me and Dr. Damita Dunnings. She was seen in the ED with chest pain on 03/06/22 and 03/24/22. Her pain was not felt to be cardiac related. Troponin negative.   She is here today for follow up. The patient denies any chest pain, dyspnea, palpitations, lower extremity edema, orthopnea, PND, dizziness, near syncope or syncope.   Primary Care Physician: Tonia Ghent, MD  Past Medical History:  Diagnosis Date   Anxiety    AR (allergic rhinitis)    Atrial fibrillation ([place])    Basal cell carcinoma of ala nasi 2014   Benign neoplasm stomach body    Cataract    corrected with surgery   Colitis    CTS (carpal tunnel syndrome)    Diverticulitis    Diverticulosis    Esophageal stricture    Frequent headaches    likely migraine   GERD (gastroesophageal reflux disease)    Hepatic steatosis    Hyperlipidemia    Hypertension    IBS (irritable bowel syndrome)    Insomnia    Internal hemorrhoids    Osteopenia      Past Surgical History:  Procedure Laterality Date   CATARACT EXTRACTION, BILATERAL     August 2018, October 2018   CHOLECYSTECTOMY     MOHS SURGERY  2014   nose    Current Outpatient Medications  Medication Sig Dispense Refill   acetaminophen (TYLENOL) 325 MG tablet Take 650 mg by mouth every 4 (four) hours as needed.     bismuth subsalicylate (PEPTO-BISMOL) 262 MG/15ML suspension Take 30 mLs by mouth 3 (three) times daily as needed.     butalbital-acetaminophen-caffeine (FIORICET) 50-325-40 MG tablet TAKE 1 TABLET BY MOUTH 2 TIMES DAILY AS NEEDED. 100 tablet 1   cinacalcet (SENSIPAR) 30 MG tablet Take 1 tablet (30 mg total) by mouth 3 (three) times a week. 30 tablet 2   cyanocobalamin (VITAMIN B12) 500 MCG tablet Take 1 tablet (500 mcg total) by mouth daily. 30 tablet 2   dicyclomine (BENTYL) 10 MG capsule TAKE 1 CAPSULE BY MOUTH UP TO 4 TIMES A DAY AS NEEDED FOR ABDOMINAL CRAMPS 100 capsule 1   diphenhydrAMINE (BENADRYL ALLERGY) 25 MG tablet Take 1 tablet (25 mg total) by mouth every 6 (six) hours as needed. 30 tablet 0   diphenhydrAMINE (BENADRYL) 2 % cream Apply topically 3 (three) times daily as needed for itching. 30 g 0   ezetimibe (ZETIA) 10 MG tablet TAKE 1 TABLET BY MOUTH  DAILY 90 tablet 1   magnesium hydroxide (MILK OF MAGNESIA) 400 MG/5ML suspension Take 5 mLs by mouth as needed for mild constipation. 355 mL 0   meclizine (ANTIVERT) 25 MG tablet Take 25 mg by mouth 3 (three) times daily as needed. For vertigo     melatonin 5 MG TABS Take 5 mg by mouth.     Multiple Vitamin (MULTIVITAMIN) tablet Take 1 tablet by mouth daily.     polyethylene glycol (MIRALAX / GLYCOLAX) 17 g packet Take 17 g by mouth daily. 14 each 0   psyllium (HYDROCIL/METAMUCIL) 95 % PACK Take 1 packet by mouth daily. 30 packet 2   rosuvastatin (CRESTOR) 5 MG tablet TAKE 1/2 TABLET BY MOUTH 5 DAYS A WEEK 30 tablet 1   No current facility-administered medications for this visit.    Allergies   Allergen Reactions   Aspirin    Ciprofloxacin    Corticosteroids     Presumed cause of rash/itching.  Do not use prednisone   Nexium [Esomeprazole Magnesium]    Niacin And Related    Propylene Glycol    Vagifem [Estradiol]    Allegra [Fexofenadine]    Bacitracin-Polymyxin B    Celebrex [Celecoxib]    Doxepin Hcl    Erythromycin    Hydrocortisone Other (See Comments)    After use of 1% HC cream, pt reported burning and feeling "scalded" in vaginal area.   Levofloxacin    Levsin [Hyoscyamine Sulfate]    Lidocaine Other (See Comments)    Unknown reaction   Macrobid [Nitrofurantoin Macrocrystal]    Marcaine [Bupivacaine Hcl]    Nsaids    Prevacid [Lansoprazole]     Able to tolerate omeprazole.    Pseudoephedrine    Sulfonamide Derivatives    Tavist [Clemastine]    Cortisone Rash    Any hydrocortisone cream component with severe vaginal burning    Social History   Socioeconomic History   Marital status: Widowed    Spouse name: Not on file   Number of children: Not on file   Years of education: Not on file   Highest education level: Not on file  Occupational History   Not on file  Tobacco Use   Smoking status: Never   Smokeless tobacco: Never  Vaping Use   Vaping Use: Never used  Substance and Sexual Activity   Alcohol use: No   Drug use: No   Sexual activity: Not Currently    Partners: Male  Other Topics Concern   Not on file  Social History Narrative   Widowed 2023- was married 1957   Retired from [employer], 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    2 kids   Social Determinants of Health   Financial Resource Strain: Low Risk  (03/08/2021)   Overall Financial Resource Strain (CARDIA)    Difficulty of Paying Living Expenses: Not hard at all  Food Insecurity: No [place] (02/07/2022)   Hunger Vital Sign    Worried About Running Out of Food in the Last Year: Never true    [place] in the Last Year: Never true  Transportation Needs: No  Transportation Needs (02/07/2022)   PRAPARE - Hydrologist (Medical): No    Lack of Transportation (Non-Medical): No  Physical Activity: Inactive (03/08/2021)   Exercise Vital Sign    Days of Exercise per Week: 0 days    Minutes of Exercise per Session: 0 min  Stress: No Stress Concern Present (  03/08/2021)   [employer] [place]    Feeling of Stress : Not at all  Social Connections: Socially Isolated (03/08/2021)   Social Connection and Isolation Panel [NHANES]    Frequency of Communication with Friends and Family: More than three times a week    Frequency of Social Gatherings with Friends and Family: More than three times a week    Attends Religious Services: Never    Marine scientist or Organizations: No    Attends Archivist Meetings: Never    Marital Status: Widowed  Intimate Partner Violence: Not At Risk (02/07/2022)   Humiliation, Afraid, Rape, and Kick questionnaire    Fear of Current or Ex-Partner: No    Emotionally Abused: No    Physically Abused: No    Sexually Abused: No    Family History  Problem Relation Age of Onset   Cancer Other    Diabetes Other    CAD Brother    Leukemia Father    Pancreatic cancer Brother    CAD Mother    Breast cancer Sister    Mesothelioma Brother    Colon cancer Neg Hx    Hypercalcemia Neg Hx     Review of Systems:  As stated in the HPI and otherwise negative.   LMP 01/17/1988 (Approximate)   Physical Examination: General: Well developed, well nourished, NAD  HEENT: OP clear, mucus membranes moist  SKIN: warm, dry. No rashes. Neuro: No focal deficits  Musculoskeletal: Muscle strength 5/5 all ext  Psychiatric: Mood and affect normal  Neck: No JVD, no carotid bruits, no thyromegaly, no lymphadenopathy.  Lungs:Clear bilaterally, no wheezes, rhonci, crackles Cardiovascular: Regular rate and rhythm. No murmurs, gallops or  rubs. Abdomen:Soft. Bowel sounds present. Non-tender.  Extremities: No lower extremity edema. Pulses are 2 + in the bilateral DP/PT.  EKG:  EKG is *** ordered today. The ekg ordered today demonstrates  Recent Labs: 02/10/2022: Magnesium 1.9 03/24/2022: ALT 16; BUN 9; Creatinine, Ser 0.74; Hemoglobin 13.4; Platelets 198; Potassium 4.1; Sodium 137   Lipid Panel    Component Value Date/Time   CHOL 162 08/27/2020 1017   CHOL 165 10/14/2015 0000   TRIG 194.0 (H) 08/27/2020 1017   TRIG 115 10/14/2015 0000   HDL 53.70 08/27/2020 1017   HDL 51 10/14/2015 0000   CHOLHDL 3 08/27/2020 1017   VLDL 38.8 08/27/2020 1017   LDLCALC 69 08/27/2020 1017   LDLCALC 91 10/14/2015 0000     Wt Readings from Last 3 Encounters:  03/24/22 61.2 kg  03/10/22 63.5 kg  03/05/22 63.5 kg    Assessment and Plan:   1. Atrial fibrillation, paroxysmal: No known recurrence in years. Cardiac monitor in February 2024 with no atrial fib. Her Eliquis has been stopped given recent GI bleeding. At her age, her risk of anti-coagulation long term is felt to outweigh the benefit.    2. Hyperlipidemia: Lipids followed in primary care. Continue statin  3. Mitral regurgitation: mild by echo in 2021.   4. Chest pain: ***   Current medicines are reviewed at length with the patient today.  The patient does not have concerns regarding medicines.  The following changes have been made:  no change  Labs/ tests ordered today include:   No orders of the defined types were placed in this encounter.  Disposition:   FU with me in 12 months.   Signed, Lauree Chandler, MD 04/02/2022 11:52 AM    [facility]  [address] [place], [place]  [address] Phone: [phone]; Fax: [phone]

## 2022-04-03 ENCOUNTER — Ambulatory Visit: Payer: Medicare Other | Attending: Cardiovascular Disease | Admitting: Cardiovascular Disease

## 2022-04-03 ENCOUNTER — Encounter: Payer: Self-pay | Admitting: Cardiovascular Disease

## 2022-04-03 VITALS — BP 124/68 | HR 74 | Ht 62.0 in | Wt 141.0 lb

## 2022-04-03 DIAGNOSIS — I340 Nonrheumatic mitral (valve) insufficiency: Secondary | ICD-10-CM

## 2022-04-03 DIAGNOSIS — I480 Paroxysmal atrial fibrillation: Secondary | ICD-10-CM

## 2022-04-03 DIAGNOSIS — E7800 Pure hypercholesterolemia, unspecified: Secondary | ICD-10-CM | POA: Diagnosis not present

## 2022-04-03 NOTE — Patient Instructions (Signed)
Medication Instructions:  No changes *If you need a refill on your cardiac medications before your next appointment, please call your pharmacy*   Lab Work: none If you have labs (blood work) drawn today and your tests are completely normal, you will receive your results only by: MyChart Message (if you have MyChart) OR A paper copy in the mail If you have any lab test that is abnormal or we need to change your treatment, we will call you to review the results.   Testing/Procedures: none   Follow-Up: At [facility], you and your health needs are our priority.  As part of our continuing mission to provide you with exceptional heart care, we have created designated Provider Care Teams.  These Care Teams include your primary Cardiologist (physician) and Advanced Practice Providers (APPs -  Physician Assistants and Nurse Practitioners) who all work together to provide you with the care you need, when you need it.  We recommend signing up for the patient portal called "MyChart".  Sign up information is provided on this After Visit Summary.  MyChart is used to connect with patients for Virtual Visits (Telemedicine).  Patients are able to view lab/test results, encounter notes, upcoming appointments, etc.  Non-urgent messages can be sent to your provider as well.   To learn more about what you can do with MyChart, go to [email or web address].    Your next appointment:   12 month(s)  Provider:   Christopher McAlhany, MD      

## 2022-04-04 ENCOUNTER — Ambulatory Visit: Payer: Medicare Other

## 2022-04-04 ENCOUNTER — Encounter: Payer: Self-pay | Admitting: Family Medicine

## 2022-04-04 ENCOUNTER — Ambulatory Visit (INDEPENDENT_AMBULATORY_CARE_PROVIDER_SITE_OTHER): Payer: Medicare Other | Admitting: Family Medicine

## 2022-04-04 VITALS — BP 108/82 | HR 69 | Temp 97.1°F | Wt 143.0 lb

## 2022-04-04 DIAGNOSIS — R109 Unspecified abdominal pain: Secondary | ICD-10-CM | POA: Diagnosis not present

## 2022-04-04 NOTE — Progress Notes (Unsigned)
Off anticoagulation.  Rationale for stopping anticoagulation discussed with patient and daughter.  Discussed possible ddx for upper abdominal/atypical chest pain, thought not to be cardiac.  Today she described it as a burning sensation, had prev described to family as "stomach upset."  Not going on currently.  We talked about criteria for transport, with family to be notified if/when she has sx.    She drinks coffee, coke cola, takes fioricet at baseline. Taking psyllium supplement.    Meds, vitals, and allergies reviewed.   ROS: Per HPI unless specifically indicated in ROS section   GEN: nad, alert and pleasant in conversation. HEENT: ncat CV: rrr.  PULM: ctab, no inc wob ABD: soft, +bs EXT: no edema SKIN: Well-perfused.  30 minutes were devoted to patient care in this encounter (this includes time spent reviewing the patient's file/history, interviewing and examining the patient, counseling/reviewing plan with patient).

## 2022-04-04 NOTE — Patient Instructions (Signed)
Stop psyllium If you have chest or abdominal pain, then use pepto and notify family early in the process.   Please let me know how this goes.  Take care.  Glad to see you.

## 2022-04-05 NOTE — Assessment & Plan Note (Signed)
Upper abdominal pain versus atypical chest pain.  Discussed options.  Thought not to be cardiac.  Reasonable to stop psyllium in case that is causing any bloating or contributing to symptoms.  If needed we could add on PPI later.  We are trying to eliminate potentially offending medication as opposed to adding another medication now.  If she has return of symptoms then for acute treatment acute treatment can use pepto if needed and notify family early in the process.    Plan agreed, okay for outpatient follow-up.  Update me as needed.

## 2022-05-02 ENCOUNTER — Encounter: Payer: Self-pay | Admitting: Family Medicine

## 2022-05-03 NOTE — Telephone Encounter (Signed)
Do not see where we have had checked in the past. Glucose readings have been in the 100-126 range with past readings

## 2022-05-08 ENCOUNTER — Telehealth: Payer: Self-pay | Admitting: Family Medicine

## 2022-05-08 DIAGNOSIS — R0789 Other chest pain: Secondary | ICD-10-CM

## 2022-05-08 DIAGNOSIS — R739 Hyperglycemia, unspecified: Secondary | ICD-10-CM | POA: Diagnosis not present

## 2022-05-08 LAB — HEMOGLOBIN A1C: Hemoglobin A1C: 6.1

## 2022-05-08 NOTE — Telephone Encounter (Signed)
Please give the order for a PA and lateral CXR and have the results sent to me.  Dx chest wall pain. R07.89.  thanks.

## 2022-05-08 NOTE — Telephone Encounter (Signed)
Pt's daughter, Misty Stanley, called stating the pt had a fall today, 4/22. Misty Stanley stated the pt fell on her right side & is now complaining about the right side of her chest/breast area being in pain. Misty Stanley stated the RN at the facility the pt lives at, [facility], checked out the pt. Misty Stanley is asking does Para March believe a xray is needed for the pt? Pt states the facility can do the xrays. Call back # [phone]

## 2022-05-08 NOTE — Telephone Encounter (Signed)
Order has been placed and faxed to [facility]. Fax [phone]. Daughter is aware this was done.

## 2022-05-08 NOTE — Addendum Note (Signed)
Addended by: Wendie Simmer B on: 05/08/2022 04:53 PM   Modules accepted: Orders

## 2022-05-09 DIAGNOSIS — R0989 Other specified symptoms and signs involving the circulatory and respiratory systems: Secondary | ICD-10-CM | POA: Diagnosis not present

## 2022-05-22 ENCOUNTER — Ambulatory Visit: Payer: Medicare Other | Admitting: Family Medicine

## 2022-05-25 ENCOUNTER — Ambulatory Visit (INDEPENDENT_AMBULATORY_CARE_PROVIDER_SITE_OTHER): Payer: Medicare Other | Admitting: Family Medicine

## 2022-05-25 ENCOUNTER — Encounter: Payer: Self-pay | Admitting: Family Medicine

## 2022-05-25 VITALS — BP 108/68 | HR 80 | Temp 97.6°F | Ht 62.0 in | Wt 142.0 lb

## 2022-05-25 DIAGNOSIS — Z0000 Encounter for general adult medical examination without abnormal findings: Secondary | ICD-10-CM | POA: Diagnosis not present

## 2022-05-25 DIAGNOSIS — F4321 Adjustment disorder with depressed mood: Secondary | ICD-10-CM | POA: Diagnosis not present

## 2022-05-25 DIAGNOSIS — R413 Other amnesia: Secondary | ICD-10-CM

## 2022-05-25 DIAGNOSIS — E785 Hyperlipidemia, unspecified: Secondary | ICD-10-CM

## 2022-05-25 DIAGNOSIS — E210 Primary hyperparathyroidism: Secondary | ICD-10-CM

## 2022-05-25 DIAGNOSIS — Z7189 Other specified counseling: Secondary | ICD-10-CM

## 2022-05-25 DIAGNOSIS — E213 Hyperparathyroidism, unspecified: Secondary | ICD-10-CM | POA: Diagnosis not present

## 2022-05-25 MED ORDER — CYANOCOBALAMIN 500 MCG PO TABS: 500.0000 ug | ORAL_TABLET | Freq: Every day | ORAL | Status: DC

## 2022-05-25 NOTE — Progress Notes (Signed)
I have personally reviewed the Medicare Annual Wellness questionnaire and have noted 1. The patient's medical and social history 2. Their use of alcohol, tobacco or illicit drugs 3. Their current medications and supplements 4. The patient's functional ability including ADL's, fall risks, home safety risks and hearing or visual             impairment. 5. Diet and physical activities 6. Evidence for depression or mood disorders  The patients weight, height, BMI have been recorded in the chart and visual acuity is per eye clinic.  I have made referrals, counseling and provided education to the patient based review of the above and I have provided the pt with a written personalized care plan for preventive services.  Provider list updated- see scanned forms.  Routine anticipatory guidance given to patient.  See health maintenance. The possibility exists that previously documented standard health maintenance information may have been brought forward from a previous encounter into this note.  If needed, that same information has been updated to reflect the current situation based on today's encounter.    Flu 2023 Shingles discussed with patient PNA discussed with patient Tetanus discussed with patient Colon cancer screening not due given her age. Breast cancer screening deferred after talking with patient. Bone density test 2022. Advance directive-daughter designated if patient were incapacitated. Cognitive function addressed- see scanned forms- and if abnormal then additional documentation follows.   In addition to [facility], follow up visit for the below conditions:  Recent GI upset noted.  She is not passing blood and I asked her to update me if she has persistent symptoms.  Off eliquis.  Discussed rationale.  It is likely safer for patient not to be anticoagulated at this point.  Patient and daughter agree.  Elevated Cholesterol: Using medications without problems:yes Muscle  aches: no Diet compliance: yes Exercise: yes Labs pending.    History of hyperparathyroidism.  Tolerating Cinacalcet.  Reasonable to continue as is.  See notes on labs.  Gradual but not acute memory changes.  D/w pt. she is oriented to self but not year today.  See plan.  Mood discussed with patient.  She still grieving the loss of her husband" I am ready to go" meaning that "if I died today then that would be fine".  Discussed counseling versus medication treatment.  She is not suicidal or homicidal and we cannot require the patient to go through treatment.  Encouraged trial of prescription treatment to see if that would help lift her mood in the meantime.  Per daughter's report she is still active and social at [facility].  Patient did not opt for medical treatment for her mood today.  I asked her to consider this.  PMH and SH reviewed  Meds, vitals, and allergies reviewed.   ROS: Per HPI.  Unless specifically indicated otherwise in HPI, the patient denies:  General: fever. Eyes: acute vision changes ENT: sore throat Cardiovascular: chest pain Respiratory: SOB GI: vomiting GU: dysuria Musculoskeletal: acute back pain Derm: acute rash Neuro: acute motor dysfunction Psych: worsening mood Endocrine: polydipsia Heme: bleeding Allergy: hayfever  GEN: nad, alert and oriented to self. HEENT: ncat NECK: supple w/o LA CV: rrr. PULM: ctab, no inc wob ABD: soft, +bs EXT: no edema SKIN: no acute rash

## 2022-05-25 NOTE — Patient Instructions (Addendum)
Go to the lab on the way out.   If you have mychart we'll likely use that to update you.    Take care.  Glad to see you. It is reasonable to think about med options to help with your mood.    I think that being at [facility] is much safer than living at home at this point.

## 2022-05-26 LAB — COMPREHENSIVE METABOLIC PANEL
ALT: 12 U/L (ref 0–35)
AST: 17 U/L (ref 0–37)
Albumin: 4.2 g/dL (ref 3.5–5.2)
Alkaline Phosphatase: 102 U/L (ref 39–117)
BUN: 12 mg/dL (ref 6–23)
CO2: 28 mEq/L (ref 19–32)
Calcium: 10.8 mg/dL — ABNORMAL HIGH (ref 8.4–10.5)
Chloride: 104 mEq/L (ref 96–112)
Creatinine, Ser: 0.88 mg/dL (ref 0.40–1.20)
GFR: 58.98 mL/min — ABNORMAL LOW (ref >60.00)
Glucose, Bld: 100 mg/dL — ABNORMAL HIGH (ref 70–99)
Potassium: 4.5 mEq/L (ref 3.5–5.1)
Sodium: 139 mEq/L (ref 135–145)
Total Bilirubin: 0.5 mg/dL (ref 0.2–1.2)
Total Protein: 7.2 g/dL (ref 6.0–8.3)

## 2022-05-26 LAB — CBC WITH DIFFERENTIAL/PLATELET
Basophils Absolute: 0 10*3/uL (ref 0.0–0.1)
Basophils Relative: 0.5 % (ref 0.0–3.0)
Eosinophils Absolute: 0.1 10*3/uL (ref 0.0–0.7)
Eosinophils Relative: 1.9 % (ref 0.0–5.0)
HCT: 41.3 % (ref 36.0–46.0)
Hemoglobin: 14.1 g/dL (ref 12.0–15.0)
Lymphocytes Relative: 24.3 % (ref 12.0–46.0)
Lymphs Abs: 1.6 10*3/uL (ref 0.7–4.0)
MCHC: 34.2 g/dL (ref 30.0–36.0)
MCV: 99.7 fl (ref 78.0–100.0)
Monocytes Absolute: 0.6 10*3/uL (ref 0.1–1.0)
Monocytes Relative: 8.5 % (ref 3.0–12.0)
Neutro Abs: 4.4 10*3/uL (ref 1.4–7.7)
Neutrophils Relative %: 64.8 % (ref 43.0–77.0)
Platelets: 228 10*3/uL (ref 150.0–400.0)
RBC: 4.14 Mil/uL (ref 3.87–5.11)
RDW: 12.8 % (ref 11.5–15.5)
WBC: 6.7 10*3/uL (ref 4.0–10.5)

## 2022-05-26 LAB — LIPID PANEL
Cholesterol: 177 mg/dL (ref 0–200)
HDL: 50.6 mg/dL (ref >39.00)
NonHDL: 126.04
Total CHOL/HDL Ratio: 3
Triglycerides: 309 mg/dL — ABNORMAL HIGH (ref 0.0–149.0)
VLDL: 61.8 mg/dL — ABNORMAL HIGH (ref 0.0–40.0)

## 2022-05-26 LAB — PTH, INTACT AND CALCIUM
Calcium: 11 mg/dL — ABNORMAL HIGH (ref 8.6–10.4)
PTH: 125 pg/mL — ABNORMAL HIGH (ref 16–77)

## 2022-05-26 LAB — LDL CHOLESTEROL, DIRECT: Direct LDL: 97 mg/dL

## 2022-05-26 LAB — TSH: TSH: 2.96 u[IU]/mL (ref 0.35–5.50)

## 2022-05-28 ENCOUNTER — Other Ambulatory Visit: Payer: Self-pay | Admitting: Family Medicine

## 2022-05-28 DIAGNOSIS — E213 Hyperparathyroidism, unspecified: Secondary | ICD-10-CM

## 2022-05-28 MED ORDER — CINACALCET HCL 30 MG PO TABS: ORAL_TABLET | ORAL | Status: AC

## 2022-05-28 NOTE — Assessment & Plan Note (Signed)
Flu 2023 Shingles discussed with patient PNA discussed with patient Tetanus discussed with patient Colon cancer screening not due given her age. Breast cancer screening deferred after talking with patient. Bone density test 2022. Advance directive-daughter designated if patient were incapacitated. Cognitive function addressed- see scanned forms- and if abnormal then additional documentation follows.

## 2022-05-28 NOTE — Assessment & Plan Note (Signed)
Mood discussed with patient.  She still grieving the loss of her husband" I am ready to go" meaning that "if I died today then that would be fine".  Discussed counseling versus medication treatment.  She is not suicidal or homicidal and we cannot require the patient to go through treatment.  Encouraged trial of prescription treatment to see if that would help lift her mood in the meantime.  Per daughter's report she is still active and social at [facility].  Patient did not opt for medical treatment for her mood today.  I asked her to consider this.

## 2022-05-28 NOTE — Assessment & Plan Note (Signed)
Advance directive- daughter designated if patient were incapacitated.  

## 2022-05-28 NOTE — Assessment & Plan Note (Signed)
History of hyperparathyroidism.  Tolerating Cinacalcet.  Reasonable to continue as is.  See notes on labs.

## 2022-05-28 NOTE — Assessment & Plan Note (Signed)
See notes on labs.  Continue Crestor as is.

## 2022-05-28 NOTE — Assessment & Plan Note (Signed)
And supportive and safe environment at [facility].  Previous CT with progression of atrophy and chronic microvascular ischemic change since 2011.  I think it makes sense to try to control risk factors as best we can given her medication intolerances and overall condition.  I suspect that her memory loss is multifactorial.  Discussed with patient that it is not reasonable/safe for her to live at home on her own now.

## 2022-05-30 ENCOUNTER — Telehealth: Payer: Self-pay

## 2022-05-30 NOTE — Telephone Encounter (Signed)
Spoke with patients daughter Misty Stanley this afternoon and she is requesting a medication be sent in for her moms mood. This was discussed at her last OV and it has been agreed upon that she really needs to take something. She is always agitated and depressed. Misty Stanley states that Dr. Para March can call or mychart her if he would like to discuss this.

## 2022-05-31 MED ORDER — SERTRALINE HCL 25 MG PO TABS: 25.0000 mg | ORAL_TABLET | Freq: Every day | ORAL | 3 refills | Status: DC

## 2022-05-31 NOTE — Telephone Encounter (Signed)
I would try adding sertraline 25mg  a day.  I sent the rx.  Please send an order to [facility] to update the med list.  I wouldn't expect a sudden change but please let me know how it goes over the next few weeks.  Thanks.

## 2022-05-31 NOTE — Addendum Note (Signed)
Addended by: Joaquim Nam on: 05/31/2022 03:21 PM   Modules accepted: Orders

## 2022-05-31 NOTE — Telephone Encounter (Signed)
Spoke with patient's daughter Misty Stanley and notified her about medication. I have faxed updated med list to [facility].

## 2022-06-05 DIAGNOSIS — I7091 Generalized atherosclerosis: Secondary | ICD-10-CM | POA: Diagnosis not present

## 2022-06-05 DIAGNOSIS — B351 Tinea unguium: Secondary | ICD-10-CM | POA: Diagnosis not present

## 2022-06-19 DIAGNOSIS — L905 Scar conditions and fibrosis of skin: Secondary | ICD-10-CM | POA: Diagnosis not present

## 2022-06-19 DIAGNOSIS — L821 Other seborrheic keratosis: Secondary | ICD-10-CM | POA: Diagnosis not present

## 2022-06-19 DIAGNOSIS — L814 Other melanin hyperpigmentation: Secondary | ICD-10-CM | POA: Diagnosis not present

## 2022-06-19 DIAGNOSIS — L570 Actinic keratosis: Secondary | ICD-10-CM | POA: Diagnosis not present

## 2022-06-21 ENCOUNTER — Encounter: Payer: Self-pay | Admitting: Cardiovascular Disease

## 2022-07-12 ENCOUNTER — Encounter: Payer: Self-pay | Admitting: Family Medicine

## 2022-08-21 DIAGNOSIS — L570 Actinic keratosis: Secondary | ICD-10-CM | POA: Diagnosis not present

## 2022-08-21 DIAGNOSIS — L821 Other seborrheic keratosis: Secondary | ICD-10-CM | POA: Diagnosis not present

## 2022-09-12 DIAGNOSIS — B351 Tinea unguium: Secondary | ICD-10-CM | POA: Diagnosis not present

## 2022-09-12 DIAGNOSIS — I7091 Generalized atherosclerosis: Secondary | ICD-10-CM | POA: Diagnosis not present

## 2022-09-26 DIAGNOSIS — H52203 Unspecified astigmatism, bilateral: Secondary | ICD-10-CM | POA: Diagnosis not present

## 2022-09-26 DIAGNOSIS — H353131 Nonexudative age-related macular degeneration, bilateral, early dry stage: Secondary | ICD-10-CM | POA: Diagnosis not present

## 2022-09-26 DIAGNOSIS — Z961 Presence of intraocular lens: Secondary | ICD-10-CM | POA: Diagnosis not present

## 2022-10-03 ENCOUNTER — Other Ambulatory Visit: Payer: Self-pay | Admitting: Family Medicine

## 2022-10-13 DIAGNOSIS — Z23 Encounter for immunization: Secondary | ICD-10-CM | POA: Diagnosis not present

## 2023-01-31 DIAGNOSIS — I7091 Generalized atherosclerosis: Secondary | ICD-10-CM | POA: Diagnosis not present

## 2023-01-31 DIAGNOSIS — B351 Tinea unguium: Secondary | ICD-10-CM | POA: Diagnosis not present

## 2023-02-19 DIAGNOSIS — L720 Epidermal cyst: Secondary | ICD-10-CM | POA: Diagnosis not present

## 2023-03-27 DIAGNOSIS — L400 Psoriasis vulgaris: Secondary | ICD-10-CM | POA: Diagnosis not present

## 2023-03-27 DIAGNOSIS — Z0000 Encounter for general adult medical examination without abnormal findings: Secondary | ICD-10-CM | POA: Diagnosis not present

## 2023-04-09 DIAGNOSIS — I7091 Generalized atherosclerosis: Secondary | ICD-10-CM | POA: Diagnosis not present

## 2023-04-09 DIAGNOSIS — B351 Tinea unguium: Secondary | ICD-10-CM | POA: Diagnosis not present

## 2023-05-23 ENCOUNTER — Other Ambulatory Visit: Payer: Self-pay | Admitting: Family Medicine

## 2023-05-23 NOTE — Telephone Encounter (Signed)
 Please call patient and schedule her for her yearly visit sometime in the summer thank you.

## 2023-05-23 NOTE — Telephone Encounter (Signed)
 Pt last seen on 05/25/2022; asking for refill on Zoloft  25mg /90tab. Last filled: 05/31/2022 with 3 refills.

## 2023-05-23 NOTE — Telephone Encounter (Signed)
 Sent.  It would be reasonable to try to set up yearly visit this summer when possible.  Thanks.

## 2023-05-24 NOTE — Telephone Encounter (Signed)
 Spoke to pt's daughter, scheduled cpe for 06/26/23

## 2023-05-31 DIAGNOSIS — L400 Psoriasis vulgaris: Secondary | ICD-10-CM | POA: Diagnosis not present

## 2023-06-26 ENCOUNTER — Ambulatory Visit (INDEPENDENT_AMBULATORY_CARE_PROVIDER_SITE_OTHER): Admitting: Family Medicine

## 2023-06-26 VITALS — BP 110/62 | HR 69 | Temp 98.3°F | Ht 59.84 in | Wt 138.4 lb

## 2023-06-26 DIAGNOSIS — R519 Headache, unspecified: Secondary | ICD-10-CM | POA: Diagnosis not present

## 2023-06-26 DIAGNOSIS — R739 Hyperglycemia, unspecified: Secondary | ICD-10-CM | POA: Diagnosis not present

## 2023-06-26 DIAGNOSIS — R413 Other amnesia: Secondary | ICD-10-CM

## 2023-06-26 DIAGNOSIS — I480 Paroxysmal atrial fibrillation: Secondary | ICD-10-CM | POA: Diagnosis not present

## 2023-06-26 DIAGNOSIS — E213 Hyperparathyroidism, unspecified: Secondary | ICD-10-CM | POA: Diagnosis not present

## 2023-06-26 DIAGNOSIS — K589 Irritable bowel syndrome without diarrhea: Secondary | ICD-10-CM

## 2023-06-26 DIAGNOSIS — E785 Hyperlipidemia, unspecified: Secondary | ICD-10-CM

## 2023-06-26 DIAGNOSIS — F419 Anxiety disorder, unspecified: Secondary | ICD-10-CM

## 2023-06-26 DIAGNOSIS — E210 Primary hyperparathyroidism: Secondary | ICD-10-CM

## 2023-06-26 DIAGNOSIS — R21 Rash and other nonspecific skin eruption: Secondary | ICD-10-CM

## 2023-06-26 LAB — LIPID PANEL
Cholesterol: 192 mg/dL (ref 0–200)
HDL: 49.4 mg/dL (ref >39.00)
LDL Cholesterol: 100 mg/dL — ABNORMAL HIGH (ref 0–99)
NonHDL: 142.5
Total CHOL/HDL Ratio: 4
Triglycerides: 211 mg/dL — ABNORMAL HIGH (ref 0.0–149.0)
VLDL: 42.2 mg/dL — ABNORMAL HIGH (ref 0.0–40.0)

## 2023-06-26 LAB — CBC WITH DIFFERENTIAL/PLATELET
Basophils Absolute: 0 10*3/uL (ref 0.0–0.1)
Basophils Relative: 0.9 % (ref 0.0–3.0)
Eosinophils Absolute: 0.1 10*3/uL (ref 0.0–0.7)
Eosinophils Relative: 1.7 % (ref 0.0–5.0)
HCT: 42.9 % (ref 36.0–46.0)
Hemoglobin: 14.5 g/dL (ref 12.0–15.0)
Lymphocytes Relative: 25.7 % (ref 12.0–46.0)
Lymphs Abs: 1.2 10*3/uL (ref 0.7–4.0)
MCHC: 33.8 g/dL (ref 30.0–36.0)
MCV: 97.1 fl (ref 78.0–100.0)
Monocytes Absolute: 0.3 10*3/uL (ref 0.1–1.0)
Monocytes Relative: 6.2 % (ref 3.0–12.0)
Neutro Abs: 3.2 10*3/uL (ref 1.4–7.7)
Neutrophils Relative %: 65.5 % (ref 43.0–77.0)
Platelets: 207 10*3/uL (ref 150.0–400.0)
RBC: 4.42 Mil/uL (ref 3.87–5.11)
RDW: 13.6 % (ref 11.5–15.5)
WBC: 4.8 10*3/uL (ref 4.0–10.5)

## 2023-06-26 LAB — COMPREHENSIVE METABOLIC PANEL WITH GFR
ALT: 12 U/L (ref 0–35)
AST: 14 U/L (ref 0–37)
Albumin: 4.3 g/dL (ref 3.5–5.2)
Alkaline Phosphatase: 88 U/L (ref 39–117)
BUN: 9 mg/dL (ref 6–23)
CO2: 30 meq/L (ref 19–32)
Calcium: 10.6 mg/dL — ABNORMAL HIGH (ref 8.4–10.5)
Chloride: 105 meq/L (ref 96–112)
Creatinine, Ser: 0.86 mg/dL (ref 0.40–1.20)
GFR: 60.17 mL/min (ref >60.00)
Glucose, Bld: 104 mg/dL — ABNORMAL HIGH (ref 70–99)
Potassium: 4.2 meq/L (ref 3.5–5.1)
Sodium: 140 meq/L (ref 135–145)
Total Bilirubin: 0.7 mg/dL (ref 0.2–1.2)
Total Protein: 7.2 g/dL (ref 6.0–8.3)

## 2023-06-26 LAB — HEMOGLOBIN A1C: Hgb A1c MFr Bld: 6 % (ref 4.6–6.5)

## 2023-06-26 MED ORDER — AQUAPHOR EX OINT: TOPICAL_OINTMENT | Freq: Every day | CUTANEOUS | Status: DC

## 2023-06-26 NOTE — Progress Notes (Unsigned)
 Elevated Cholesterol: Using medications without problems: yes Muscle aches: no complaints of aches.  Labs pending.  Compliant with medication.  She is off eliquis .  No bleeding.  H/o AF noted.  She overall felt better off med.  She isn't having chest pain.   Still taking Fioricet  as needed for headaches at baseline without adverse effect on medication.  Still dealing with occ abd discomfort, improved with TUMS prn.  She isn't taking bentyl  and did okay with the change.    Mood d/w pt.  Still taking zoloft .  It helped with anxiety.  No adverse effect of medication.  H/o mild hyperglycemia, A1c pending.   Single fall, not recent, likely fell on the bedspread. No injury.  She was helped up/checked at the time.    She has memory changes, short term changes.  This is progressive over the years.  Here today with daughter but didn't remember where she had breakfast this AM.  We opted to defer extra treatment at this point, d/w pt and daughter.  She is taking her meals in the dining rooms and participating in activities at [facility].   Had been using aquaphor in the AMs.  D/w pt about trying at night to see if that would be less bothersome to the patient.    History of hyperparathyroidism concurrently treated with Cinacalcet .  Compliant.  See notes on labs.  Patient's daughter told me after the fact that the patient's sister had passed away during the past year.  Patient did not mention this at the office visit and I did not bring it up with the patient.  Condolences given to patient's daughter.   Meds, vitals, and allergies reviewed.   ROS: Per HPI unless specifically indicated in ROS section   Nad, pleasant conversation.  She does not have recall of short-term events like where she had breakfast. Ncat Neck supple no LA Psoriatic changes on the back of the scalp.   Rrr Ctab Abd soft, not ttp Skin well perfused.

## 2023-06-26 NOTE — Patient Instructions (Addendum)
 Go to the lab on the way out.   If you have mychart we'll likely use that to update you.    Take care.  Glad to see you. Try aquaphor at night.

## 2023-06-27 DIAGNOSIS — R739 Hyperglycemia, unspecified: Secondary | ICD-10-CM | POA: Insufficient documentation

## 2023-06-27 LAB — PTH, INTACT AND CALCIUM
Calcium: 10.3 mg/dL (ref 8.6–10.4)
PTH: 24 pg/mL (ref 16–77)

## 2023-06-27 NOTE — Assessment & Plan Note (Signed)
 Mood improved with sertraline .  Would continue as is.

## 2023-06-27 NOTE — Assessment & Plan Note (Signed)
 Continue Crestor  and Zetia  as is.  See notes on labs.

## 2023-06-27 NOTE — Assessment & Plan Note (Signed)
 History of IBS.  She is not taking Bentyl  and she did better off that medication.  Would continue as is.

## 2023-06-27 NOTE — Assessment & Plan Note (Signed)
 See notes on labs.

## 2023-06-27 NOTE — Assessment & Plan Note (Signed)
 Can continue with Fioricet  as needed.

## 2023-06-27 NOTE — Assessment & Plan Note (Signed)
 She has memory changes, short term changes.  This is progressive over the years.  Here today with daughter but didn't remember where she had breakfast this AM.  We opted to defer extra treatment at this point, d/w pt and daughter.  She is taking her meals in the dining rooms and participating in activities at [facility].  I asked patient/daughter to update me as needed.

## 2023-06-27 NOTE — Assessment & Plan Note (Signed)
 History of hyperparathyroidism concurrently treated with Cinacalcet .  Compliant.  See notes on labs.

## 2023-06-27 NOTE — Assessment & Plan Note (Signed)
 History of.  She is off anticoagulation and felt better off the medication.  She had fallen once and at this point it appears that the risk of anticoagulation is likely greater than the benefit.  See notes on labs.

## 2023-06-27 NOTE — Assessment & Plan Note (Signed)
 Had been using aquaphor in the AMs.  D/w pt about trying at night to see if that would be less bothersome to the patient.

## 2023-06-28 LAB — VITAMIN B12: Vitamin B-12: 683 pg/mL (ref 211–911)

## 2023-06-28 LAB — TSH: TSH: 2.62 u[IU]/mL (ref 0.35–5.50)

## 2023-07-01 ENCOUNTER — Ambulatory Visit: Payer: Self-pay | Admitting: Family Medicine

## 2023-08-20 DIAGNOSIS — L400 Psoriasis vulgaris: Secondary | ICD-10-CM | POA: Diagnosis not present

## 2023-09-28 ENCOUNTER — Ambulatory Visit: Admitting: Family

## 2023-09-28 DIAGNOSIS — B029 Zoster without complications: Secondary | ICD-10-CM | POA: Diagnosis not present

## 2023-10-25 ENCOUNTER — Ambulatory Visit: Admitting: Family Medicine

## 2023-10-25 ENCOUNTER — Other Ambulatory Visit (HOSPITAL_COMMUNITY): Payer: Self-pay

## 2023-10-25 ENCOUNTER — Encounter: Payer: Self-pay | Admitting: Family Medicine

## 2023-10-25 ENCOUNTER — Telehealth: Payer: Self-pay

## 2023-10-25 VITALS — BP 124/64 | HR 71 | Temp 98.0°F | Ht 59.84 in | Wt 138.8 lb

## 2023-10-25 DIAGNOSIS — B0229 Other postherpetic nervous system involvement: Secondary | ICD-10-CM

## 2023-10-25 DIAGNOSIS — Z0000 Encounter for general adult medical examination without abnormal findings: Secondary | ICD-10-CM | POA: Diagnosis not present

## 2023-10-25 DIAGNOSIS — F419 Anxiety disorder, unspecified: Secondary | ICD-10-CM

## 2023-10-25 MED ORDER — LIDOCAINE 5 % EX PTCH: 1.0000 | MEDICATED_PATCH | CUTANEOUS | 1 refills | Status: AC

## 2023-10-25 MED ORDER — LIDOCAINE 5 % EX PTCH: 1.0000 | MEDICATED_PATCH | CUTANEOUS | 1 refills | Status: DC

## 2023-10-25 MED ORDER — SERTRALINE HCL 50 MG PO TABS: 50.0000 mg | ORAL_TABLET | Freq: Every day | ORAL | 3 refills | Status: AC

## 2023-10-25 NOTE — Progress Notes (Signed)
 R neck and chest wall shingles.  Some burning.  Some itching.  Prev ulceration/lesions healed.   She had prev listed allergy to lidocaine.  No h/o anaphylaxis.  She has tolerated OTC lidocaine patches.  D/w pt and daughter, reasonable to use lidoderm patch.    Inc anxiety and sleep disruption.  Sx worse at night.  Sertraline  prev helped.  Discussed options.   D/w pt about covid and flu shots.  Can be done at [facility].    Meds, vitals, and allergies reviewed.   ROS: Per HPI unless specifically indicated in ROS section   Nad Ncat Neck supple, no LA but chronic postinflammatory changes noted along the dermatome on the right side of the neck and upper chest wall. Rrr Ctab Abdomen soft. Skin well-perfused.  Hardcopy forms done for [facility] at office visit.  30 minutes were devoted to patient care in this encounter (this includes time spent reviewing the patient's file/history, interviewing and examining the patient, counseling/reviewing plan with patient).

## 2023-10-25 NOTE — Patient Instructions (Signed)
 Covid and flu shots at [facility].  Try lidocaine patch for now. Update me as needed.  Increase sertraline  to 50mg  a day.  Let me know if that isn't helping.  Take care.  Glad to see you.

## 2023-10-25 NOTE — Telephone Encounter (Signed)
 Pharmacy Patient Advocate Encounter   Received notification from Onbase that prior authorization for Lidocaine 5% patches is required/requested.   Insurance verification completed.   The patient is insured through [facility].   Per test claim: PA required; PA submitted to above mentioned insurance via Latent Key/confirmation #/EOC [id number] Status is pending

## 2023-10-26 NOTE — Telephone Encounter (Signed)
 Pharmacy Patient Advocate Encounter  Received notification from [facility] that Prior Authorization for Lidocaine 5% patches has been DENIED.  Full denial letter will be uploaded to the media tab. See denial reason below.    PA #/Case ID/Reference  #: # [id number]

## 2023-10-26 NOTE — Telephone Encounter (Signed)
 Please appeal and use post herpetic neuralgia for the dx.  Thanks.

## 2023-10-28 DIAGNOSIS — B0229 Other postherpetic nervous system involvement: Secondary | ICD-10-CM | POA: Insufficient documentation

## 2023-10-28 DIAGNOSIS — Z0000 Encounter for general adult medical examination without abnormal findings: Secondary | ICD-10-CM | POA: Insufficient documentation

## 2023-10-28 NOTE — Assessment & Plan Note (Signed)
 Reasonable to use lidocaine patch.  Update me as needed.

## 2023-10-28 NOTE — Assessment & Plan Note (Signed)
 Sertraline  previously helped.  Would increase sertraline  to 50 mg a day and then update me as needed.

## 2023-10-28 NOTE — Assessment & Plan Note (Signed)
 She can get flu and COVID shots at [facility].

## 2023-10-29 ENCOUNTER — Telehealth: Payer: Self-pay

## 2023-10-29 NOTE — Telephone Encounter (Signed)
 ERROR

## 2023-10-30 DIAGNOSIS — H52203 Unspecified astigmatism, bilateral: Secondary | ICD-10-CM | POA: Diagnosis not present

## 2023-10-30 DIAGNOSIS — H26493 Other secondary cataract, bilateral: Secondary | ICD-10-CM | POA: Diagnosis not present

## 2023-10-30 DIAGNOSIS — Z961 Presence of intraocular lens: Secondary | ICD-10-CM | POA: Diagnosis not present

## 2023-10-30 DIAGNOSIS — H353131 Nonexudative age-related macular degeneration, bilateral, early dry stage: Secondary | ICD-10-CM | POA: Diagnosis not present

## 2023-11-06 ENCOUNTER — Other Ambulatory Visit (HOSPITAL_COMMUNITY): Payer: Self-pay

## 2023-11-06 ENCOUNTER — Telehealth: Payer: Self-pay

## 2023-11-06 NOTE — Telephone Encounter (Signed)
 Pharmacy Patient Advocate Encounter   Received notification from Pt Calls Messages that prior authorization for Lidocaine 5% patches is required/requested.   Insurance verification completed.   The patient is insured through [facility].   Per test claim: PA required; PA submitted to above mentioned insurance via Latent Key/confirmation #/EOC [employer]. Status is pending   Used post herpetic neuralgia B02.29

## 2023-11-06 NOTE — Telephone Encounter (Signed)
 Noted (see other messages by clicking blue '[address] [place]' link).

## 2023-11-07 NOTE — Telephone Encounter (Signed)
 Noted. PA is submitted and pending per 11/06/23 note.

## 2023-11-09 DIAGNOSIS — Z23 Encounter for immunization: Secondary | ICD-10-CM | POA: Diagnosis not present

## 2023-11-19 DIAGNOSIS — L400 Psoriasis vulgaris: Secondary | ICD-10-CM | POA: Diagnosis not present

## 2024-01-07 ENCOUNTER — Telehealth: Payer: Self-pay

## 2024-01-07 NOTE — Telephone Encounter (Signed)
 Copied from CRM #[id number]. Topic: Clinical - Medication Question >> Jan 04, 2024  3:29 PM Pinkey ORN wrote: Reason for CRM: Medication Question >> Jan 04, 2024  3:34 PM Pinkey ORN wrote: Olam daughter [phone] Called on behalf of patient, states patient developed a cough and is wanting to know if patient can be prescribed a cough medicine. Olam is wanting to know if there's anything that can be done today, since patient is allergic to a great bit of medications and can't take just anything OTC.

## 2024-01-07 NOTE — Telephone Encounter (Signed)
 Called patient daughter on dpr. She declined appointment. She has been taking otc delsym that didn't have anything that she is allergic to. She has been taking over the weekend and cough has improved. I have reviewed red words with daughter. If any she will have her evaluated in office or at [facility].   Cough has been dry. Denies any sore throat, fever or any other symptoms.

## 2024-01-07 NOTE — Telephone Encounter (Signed)
 Agree.  Thanks.  Would continue as is.

## 2024-01-09 ENCOUNTER — Inpatient Hospital Stay
Admission: EM | Admit: 2024-01-09 | Discharge: 2024-01-10 | DRG: 175 | Disposition: A | Discharge status: Home Health | Source: Skilled Nursing Facility | Attending: Obstetrics and Gynecology | Admitting: Obstetrics and Gynecology

## 2024-01-09 ENCOUNTER — Emergency Department

## 2024-01-09 ENCOUNTER — Other Ambulatory Visit: Payer: Self-pay

## 2024-01-09 DIAGNOSIS — E785 Hyperlipidemia, unspecified: Secondary | ICD-10-CM | POA: Diagnosis present

## 2024-01-09 DIAGNOSIS — Z79899 Other long term (current) drug therapy: Secondary | ICD-10-CM

## 2024-01-09 DIAGNOSIS — I10 Essential (primary) hypertension: Secondary | ICD-10-CM | POA: Diagnosis present

## 2024-01-09 DIAGNOSIS — Z889 Allergy status to unspecified drugs, medicaments and biological substances status: Secondary | ICD-10-CM

## 2024-01-09 DIAGNOSIS — M8580 Other specified disorders of bone density and structure, unspecified site: Secondary | ICD-10-CM | POA: Diagnosis present

## 2024-01-09 DIAGNOSIS — Z85828 Personal history of other malignant neoplasm of skin: Secondary | ICD-10-CM

## 2024-01-09 DIAGNOSIS — G4700 Insomnia, unspecified: Secondary | ICD-10-CM | POA: Diagnosis present

## 2024-01-09 DIAGNOSIS — Z800 Family history of malignant neoplasm of digestive organs: Secondary | ICD-10-CM

## 2024-01-09 DIAGNOSIS — I480 Paroxysmal atrial fibrillation: Secondary | ICD-10-CM | POA: Diagnosis present

## 2024-01-09 DIAGNOSIS — Z886 Allergy status to analgesic agent status: Secondary | ICD-10-CM

## 2024-01-09 DIAGNOSIS — Z881 Allergy status to other antibiotic agents status: Secondary | ICD-10-CM

## 2024-01-09 DIAGNOSIS — R0902 Hypoxemia: Secondary | ICD-10-CM | POA: Diagnosis present

## 2024-01-09 DIAGNOSIS — Z803 Family history of malignant neoplasm of breast: Secondary | ICD-10-CM

## 2024-01-09 DIAGNOSIS — F32A Depression, unspecified: Secondary | ICD-10-CM | POA: Diagnosis present

## 2024-01-09 DIAGNOSIS — K219 Gastro-esophageal reflux disease without esophagitis: Secondary | ICD-10-CM | POA: Diagnosis present

## 2024-01-09 DIAGNOSIS — I2699 Other pulmonary embolism without acute cor pulmonale: Principal | ICD-10-CM | POA: Diagnosis present

## 2024-01-09 DIAGNOSIS — J90 Pleural effusion, not elsewhere classified: Secondary | ICD-10-CM | POA: Diagnosis present

## 2024-01-09 DIAGNOSIS — Z833 Family history of diabetes mellitus: Secondary | ICD-10-CM

## 2024-01-09 DIAGNOSIS — Z7901 Long term (current) use of anticoagulants: Secondary | ICD-10-CM

## 2024-01-09 DIAGNOSIS — J9601 Acute respiratory failure with hypoxia: Secondary | ICD-10-CM | POA: Diagnosis present

## 2024-01-09 DIAGNOSIS — Z8249 Family history of ischemic heart disease and other diseases of the circulatory system: Secondary | ICD-10-CM

## 2024-01-09 DIAGNOSIS — K760 Fatty (change of) liver, not elsewhere classified: Secondary | ICD-10-CM | POA: Diagnosis present

## 2024-01-09 DIAGNOSIS — Z888 Allergy status to other drugs, medicaments and biological substances status: Secondary | ICD-10-CM

## 2024-01-09 DIAGNOSIS — K7460 Unspecified cirrhosis of liver: Secondary | ICD-10-CM | POA: Diagnosis present

## 2024-01-09 DIAGNOSIS — Z66 Do not resuscitate: Secondary | ICD-10-CM | POA: Diagnosis present

## 2024-01-09 DIAGNOSIS — F0390 Unspecified dementia without behavioral disturbance: Secondary | ICD-10-CM | POA: Insufficient documentation

## 2024-01-09 DIAGNOSIS — I82452 Acute embolism and thrombosis of left peroneal vein: Secondary | ICD-10-CM | POA: Diagnosis present

## 2024-01-09 DIAGNOSIS — Z1152 Encounter for screening for COVID-19: Secondary | ICD-10-CM

## 2024-01-09 DIAGNOSIS — R079 Chest pain, unspecified: Secondary | ICD-10-CM

## 2024-01-09 DIAGNOSIS — Z806 Family history of leukemia: Secondary | ICD-10-CM

## 2024-01-09 DIAGNOSIS — F0394 Unspecified dementia, unspecified severity, with anxiety: Secondary | ICD-10-CM | POA: Diagnosis present

## 2024-01-09 DIAGNOSIS — I82409 Acute embolism and thrombosis of unspecified deep veins of unspecified lower extremity: Secondary | ICD-10-CM | POA: Insufficient documentation

## 2024-01-09 DIAGNOSIS — F0393 Unspecified dementia, unspecified severity, with mood disturbance: Secondary | ICD-10-CM | POA: Diagnosis present

## 2024-01-09 DIAGNOSIS — F419 Anxiety disorder, unspecified: Secondary | ICD-10-CM

## 2024-01-09 LAB — CBC
HCT: 38.4 % (ref 36.0–46.0)
Hemoglobin: 13.2 g/dL (ref 12.0–15.0)
MCH: 32.9 pg (ref 26.0–34.0)
MCHC: 34.4 g/dL (ref 30.0–36.0)
MCV: 95.8 fL (ref 80.0–100.0)
Platelets: 212 K/uL (ref 150–400)
RBC: 4.01 MIL/uL (ref 3.87–5.11)
RDW: 12.1 % (ref 11.5–15.5)
WBC: 9.2 K/uL (ref 4.0–10.5)
nRBC: 0 % (ref 0.0–0.2)

## 2024-01-09 MED ORDER — NITROGLYCERIN 0.4 MG SL SUBL
0.4000 mg | SUBLINGUAL_TABLET | SUBLINGUAL | Status: DC | PRN
  Administered 2024-01-09 – 2024-01-10 (×2): 0.4 mg via SUBLINGUAL
  Filled 2024-01-09: qty 1

## 2024-01-09 NOTE — ED Provider Notes (Signed)
 "  [facility] Provider Note    Event Date/Time   First MD Initiated Contact with Patient 01/09/24 2316     (approximate)   History   Chest Pain   HPI  [name] is a 89 y.o. female with history of cirrhosis, hypertension, hyperlipidemia, headaches, GERD who presents to the emergency department complaints of chest discomfort and shortness of breath.  Started tonight.  Denies any aggravating or relieving factors.  Chest pain feels like a pressure.  She has a hard time describing this further.  Denies any fevers, cough or congestion.  Denies history of ACS, PE, DVT, CHF.  No medications given by EMS in route.   History provided by patient, EMS.    Past Medical History:  Diagnosis Date   Anxiety    AR (allergic rhinitis)    Atrial fibrillation (HCC)    Basal cell carcinoma of ala nasi 2014   Benign neoplasm stomach body    Cataract    corrected with surgery   Colitis    CTS (carpal tunnel syndrome)    Diverticulitis    Diverticulosis    Esophageal stricture    Frequent headaches    likely migraine   GERD (gastroesophageal reflux disease)    Hepatic steatosis    Hyperlipidemia    Hypertension    IBS (irritable bowel syndrome)    Insomnia    Internal hemorrhoids    Osteopenia     Past Surgical History:  Procedure Laterality Date   CATARACT EXTRACTION, BILATERAL     August 2018, October 2018   CHOLECYSTECTOMY     MOHS SURGERY  2014   nose    MEDICATIONS:  Prior to Admission medications  Medication Sig Start Date End Date Taking? Authorizing Provider  acetaminophen  (TYLENOL ) 325 MG tablet Take 650 mg by mouth every 4 (four) hours as needed.    [provider]  bismuth  subsalicylate (PEPTO-BISMOL) 262 MG/15ML suspension Take 30 mLs by mouth 3 (three) times daily as needed. 03/10/22   Cleatus Arlyss RAMAN, MD  butalbital -acetaminophen -caffeine  (FIORICET ) 50-325-40 MG tablet TAKE 1 TABLET BY MOUTH 2 TIMES DAILY AS NEEDED. 04/19/21    Cleatus Arlyss RAMAN, MD  cinacalcet  (SENSIPAR ) 30 MG tablet 1 tablet by mouth 5 days/week 05/29/22   Cleatus Arlyss RAMAN, MD  cyanocobalamin  (VITAMIN B12) 500 MCG tablet Take 1 tablet (500 mcg total) by mouth daily. 05/25/22   Cleatus Arlyss RAMAN, MD  diphenhydrAMINE  (BENADRYL  ALLERGY) 25 MG tablet Take 1 tablet (25 mg total) by mouth every 6 (six) hours as needed. 09/05/21   Cleatus Arlyss RAMAN, MD  diphenhydrAMINE  (BENADRYL ) 2 % cream Apply topically 3 (three) times daily as needed for itching. 10/24/21   Cleatus Arlyss RAMAN, MD  ezetimibe  (ZETIA ) 10 MG tablet TAKE 1 TABLET BY MOUTH DAILY 02/28/21   Cleatus Arlyss RAMAN, MD  lidocaine  (LIDODERM ) 5 % Place 1 patch onto the skin daily. Remove & Discard patch within 12 hours or as directed by MD 10/25/23   Cleatus Arlyss RAMAN, MD  magnesium  hydroxide (MILK OF MAGNESIA) 400 MG/5ML suspension Take 5 mLs by mouth as needed for mild constipation. 09/05/21   Cleatus Arlyss RAMAN, MD  meclizine  (ANTIVERT ) 25 MG tablet Take 25 mg by mouth 3 (three) times daily as needed. For vertigo    [provider]  melatonin 5 MG TABS Take 5 mg by mouth.    [provider]  mineral oil-hydrophilic petrolatum (AQUAPHOR) ointment Apply topically at bedtime. 06/26/23   Cleatus,  Arlyss RAMAN, MD  Multiple Vitamin (MULTIVITAMIN) tablet Take 1 tablet by mouth daily.    [provider]  polyethylene glycol (MIRALAX  / GLYCOLAX ) 17 g packet Take 17 g by mouth daily. 02/11/22   Von Bellis, MD  rosuvastatin  (CRESTOR ) 5 MG tablet TAKE 1/2 TABLET BY MOUTH 5 DAYS A WEEK 03/28/21   Cleatus Arlyss RAMAN, MD  sertraline  (ZOLOFT ) 50 MG tablet Take 1 tablet (50 mg total) by mouth daily. 10/25/23   Cleatus Arlyss RAMAN, MD    Physical Exam   Triage Vital Signs: ED Triage Vitals  Encounter Vitals Group     BP 01/09/24 2322 (!) 144/66     Girls Systolic BP Percentile --      Girls Diastolic BP Percentile --      Boys Systolic BP Percentile --      Boys Diastolic BP Percentile --      Pulse Rate  01/09/24 2322 67     Resp 01/09/24 2322 18     Temp 01/09/24 2322 98 F (36.7 C)     Temp Source 01/09/24 2322 Oral     SpO2 01/09/24 2322 98 %     Weight --      Height --      Head Circumference --      Peak Flow --      Pain Score 01/09/24 2323 8     Pain Loc --      Pain Education --      Exclude from Growth Chart --     Most recent vital signs: Vitals:   01/09/24 2325 01/10/24 0000  BP: 134/67 123/64  Pulse: 66 64  Resp: 14 17  Temp:    SpO2: 100% 100%    CONSTITUTIONAL: Alert, responds appropriately to questions.  Elderly, appears uncomfortable HEAD: Normocephalic, atraumatic EYES: Conjunctivae clear, pupils appear equal, sclera nonicteric ENT: normal nose; moist mucous membranes NECK: Supple, normal ROM CARD: RRR; S1 and S2 appreciated RESP: Patient is mildly tachypneic, breath sounds clear and equal bilaterally; no wheezes, no rhonchi, no rales, no hypoxia or respiratory distress, speaking full sentences ABD/GI: Non-distended; soft, non-tender, no rebound, no guarding, no peritoneal signs BACK: The back appears normal EXT: Normal ROM in all joints; no deformity noted, no edema, no calf tenderness or calf swelling SKIN: Normal color for age and race; warm; no rash on exposed skin NEURO: Moves all extremities equally, normal speech PSYCH: The patient's mood and manner are appropriate.   ED Results / Procedures / Treatments   LABS: (all labs ordered are listed, but only abnormal results are displayed) Labs Reviewed  BASIC METABOLIC PANEL WITH GFR - Abnormal; Notable for the following components:      Result Value   Glucose, Bld 109 (*)    All other components within normal limits  D-DIMER, QUANTITATIVE - Abnormal; Notable for the following components:   D-Dimer, Quant 2.13 (*)    All other components within normal limits  RESP PANEL BY RT-PCR (RSV, FLU A&B, COVID)  RVPGX2  CULTURE, BLOOD (ROUTINE X 2)  CULTURE, BLOOD (ROUTINE X 2)  CBC  PRO BRAIN  NATRIURETIC PEPTIDE  LACTIC ACID, PLASMA  PROCALCITONIN  APTT  PROTIME-INR  TROPONIN T, HIGH SENSITIVITY  TROPONIN T, HIGH SENSITIVITY     EKG:  EKG Interpretation Date/Time:  Wednesday January 09 2024 23:24:22 EST Ventricular Rate:  69 PR Interval:  162 QRS Duration:  94 QT Interval:  426 QTC Calculation: 457 R Axis:   -23  Text  Interpretation: Sinus rhythm Borderline left axis deviation Low voltage, precordial leads Consider anterior infarct Confirmed by Neomi Neptune [phone]) on 01/09/2024 11:29:05 PM         RADIOLOGY: My personal review and interpretation of imaging: Right-sided PE, pleural effusion.  I have personally reviewed all radiology reports.   CT Angio Chest PE W and/or Wo Contrast Result Date: 01/10/2024 CLINICAL DATA:  Pulmonary embolism (PE) suspected, low to intermediate prob, positive D-dimer 89 year old with left-sided pain. EXAM: CT ANGIOGRAPHY CHEST WITH CONTRAST TECHNIQUE: Multidetector CT imaging of the chest was performed using the standard protocol during bolus administration of intravenous contrast. Multiplanar CT image reconstructions and MIPs were obtained to evaluate the vascular anatomy. RADIATION DOSE REDUCTION: This exam was performed according to the departmental dose-optimization program which includes automated exposure control, adjustment of the mA and/or kV according to patient size and/or use of iterative reconstruction technique. CONTRAST:  75mL OMNIPAQUE  IOHEXOL  350 MG/ML SOLN COMPARISON:  Radiograph yesterday FINDINGS: Cardiovascular: Small acute pulmonary emboli in the right lung. Segmental filling defect within the medial right middle lobe, series 5, image 125-127. subsegmental filling defect within the posterior basal lower lobe series 5, image 157. Thromboembolic burden is small. Would not assess for right heart strain given peripheral location. The heart is enlarged. There are coronary artery calcifications. Aortic atherosclerosis. No  pericardial effusion. Mediastinum/Nodes: 12 mm right hilar node series 4, image 53. No enlarged mediastinal lymph nodes. Small hiatal hernia. Lungs/Pleura: No pulmonary infarct. Small right pleural effusion. Mild heterogeneous pulmonary parenchyma. No features of pulmonary edema. 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 Upper Abdomen: Cholecystectomy. Atherosclerosis of the abdominal aorta. Musculoskeletal: There are no acute or suspicious osseous abnormalities. Review of the MIP images confirms the above findings. IMPRESSION: 1. Small acute pulmonary emboli in the right lung. Thromboembolic burden is small. 2. Small right pleural effusion. 3. Cardiomegaly. Coronary artery calcifications. 4. 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 Aortic Atherosclerosis (ICD10-I70.0). Critical Value/emergent results were called by telephone at the time of interpretation on 01/10/2024 at 1:09 am to provider [facility] , who verbally acknowledged these results. Electronically Signed   By: Andrea Gasman M.D.   On: 01/10/2024 01:09   DG Chest Port 1 View Result Date: 01/09/2024 EXAM: 2 VIEW(S) XRAY OF THE CHEST 01/09/2024 11:50:00 PM COMPARISON: AP and lateral chest 03/24/2022. CLINICAL HISTORY: Chest pain and shortening of breath  . FINDINGS: LINES, TUBES AND DEVICES: Multiple overlying telemetry leads. LUNGS AND PLEURA: There is a low inspiration on. exam. Asymmetric coarse interstitial change in the left base could be due to low lung volumes or infiltrate. The lungs are hypoexpanded, otherwise clear . No substantial pleural effusion. No pneumothorax. HEART AND MEDIASTINUM: There is atherosclerosis and slight tortuosity of the aorta with a stable mediastinum. Mild cardiomegaly with no evidence of CHF. BONES AND SOFT TISSUES: Osteopenia and degenerative changes. IMPRESSION: 1. Asymmetric coarse interstitial change in the left base,  possibly due to low lung volumes or infiltrate. Repeat study recommended in full inspiration, preferably with 2 views if possible. 2. Mild cardiomegaly without evidence of congestive heart failure. Electronically signed by: Francis Quam MD 01/09/2024 11:57 PM EST RP Workstation: [id number]     PROCEDURES:  Critical Care performed: Yes, see critical care procedure note(s)   CRITICAL CARE Performed by: Neptune Neomi   Total critical care time: 30 minutes  Critical care time was exclusive of separately billable procedures and treating other patients.  Critical  care was necessary to treat or prevent imminent or life-threatening deterioration.  Critical care was time spent personally by me on the following activities: development of treatment plan with patient and/or surrogate as well as nursing, discussions with consultants, evaluation of patient's response to treatment, examination of patient, obtaining history from patient or surrogate, ordering and performing treatments and interventions, ordering and review of laboratory studies, ordering and review of radiographic studies, pulse oximetry and re-evaluation of patient's condition.   SABRA1-3 Lead EKG Interpretation  Performed by: Vanita Cannell, Josette SAILOR, DO Authorized by: Daneil Beem, Josette SAILOR, DO     Interpretation: normal     ECG rate:  67   ECG rate assessment: normal     Rhythm: sinus rhythm     Ectopy: none     Conduction: normal       IMPRESSION / MDM / ASSESSMENT AND PLAN / ED COURSE  I reviewed the triage vital signs and the nursing notes.    Patient here with complaints of chest pain, shortness of breath.  The patient is on the cardiac monitor to evaluate for evidence of arrhythmia and/or significant heart rate changes.   DIFFERENTIAL DIAGNOSIS (includes but not limited to):   ACS, PE, dissection, pneumonia, viral URI, GERD, chest wall pain, CHF, pneumothorax   Patient's presentation is most consistent with acute presentation  with potential threat to life or bodily function.   PLAN: Will obtain labs, EKG, chest x-ray.  Will give nitroglycerin .  Patient has allergy listed to aspirin.  She does appear uncomfortable on exam but is hemodynamically stable.   MEDICATIONS GIVEN IN ED: Medications  nitroGLYCERIN  (NITROSTAT ) SL tablet 0.4 mg (0.4 mg Sublingual Given 01/10/24 0005)  heparin  bolus via infusion 4,000 Units (has no administration in time range)  heparin  ADULT infusion 100 units/mL (25000 units/250mL) (has no administration in time range)  morphine  (PF) 2 MG/ML injection 2 mg (2 mg Intravenous Given 01/10/24 0026)  ondansetron  (ZOFRAN ) injection 4 mg (4 mg Intravenous Given 01/10/24 0028)  iohexol  (OMNIPAQUE ) 350 MG/ML injection 75 mL (75 mLs Intravenous Contrast Given 01/10/24 0050)     ED COURSE: EKG shows sinus rhythm, no ischemia.  D-dimer elevated so CTA of the chest was obtained.  CTA reviewed and interpreted by myself and the radiologist and shows right-sided PE and pleural effusion likely the cause of her right-sided pain.  She has become hypoxic to 87% on room air at rest.  She is awake, sitting upright, talking without difficulty.  Does not wear oxygen chronically.  This could be secondary to her PE but she did receive a small dose of morphine  2 mg that could also contribute to this.  Will start her on oxygen.  Radiologist does not see any sign of pulmonary infarct or infection.  BNP is normal.  No edema seen on imaging.  First troponin negative.  Second pending.  Will discuss with hospitalist for admission.  CONSULTS:  Consulted and discussed patient's case with hospitalist, Dr. Lawence.  I have recommended admission and consulting physician agrees and will place admission orders.  Patient (and family if present) agree with this plan.   I reviewed all nursing notes, vitals, pertinent previous records.  All labs, EKGs, imaging ordered have been independently reviewed and interpreted by  myself.    OUTSIDE RECORDS REVIEWED: Reviewed most recent family medicine notes.  It does look like patient reached out to her doctor on 01/07/2024 for complaints of cough.       FINAL CLINICAL IMPRESSION(S) / ED DIAGNOSES  Final diagnoses:  Nonspecific chest pain  Acute pulmonary embolism without acute cor pulmonale, unspecified pulmonary embolism type (HCC)  Acute respiratory failure with hypoxia (HCC)     Rx / DC Orders   ED Discharge Orders     None        Note:  This document was prepared using Dragon voice recognition software and may include unintentional dictation errors.   Gianelle Mccaul, Josette SAILOR, DO 01/10/24 [phone]  "

## 2024-01-09 NOTE — ED Triage Notes (Signed)
 Pt presents via EMS from [facility] c/o left sided chest pain, pain with inspiration. EMS reports 02 saturation 89% on room air with improvement with supplemental 02. Pt has DNR at bedside. ESM reports baseline memory issues.

## 2024-01-10 ENCOUNTER — Emergency Department

## 2024-01-10 ENCOUNTER — Inpatient Hospital Stay
Admit: 2024-01-10 | Discharge: 2024-01-10 | Disposition: A | Discharge status: Home / Self Care | Attending: Family Medicine | Admitting: Family Medicine

## 2024-01-10 ENCOUNTER — Inpatient Hospital Stay

## 2024-01-10 DIAGNOSIS — Z881 Allergy status to other antibiotic agents status: Secondary | ICD-10-CM | POA: Diagnosis not present

## 2024-01-10 DIAGNOSIS — I82409 Acute embolism and thrombosis of unspecified deep veins of unspecified lower extremity: Secondary | ICD-10-CM | POA: Insufficient documentation

## 2024-01-10 DIAGNOSIS — F0393 Unspecified dementia, unspecified severity, with mood disturbance: Secondary | ICD-10-CM | POA: Diagnosis present

## 2024-01-10 DIAGNOSIS — Z79899 Other long term (current) drug therapy: Secondary | ICD-10-CM | POA: Diagnosis not present

## 2024-01-10 DIAGNOSIS — I2699 Other pulmonary embolism without acute cor pulmonale: Secondary | ICD-10-CM | POA: Diagnosis present

## 2024-01-10 DIAGNOSIS — Z806 Family history of leukemia: Secondary | ICD-10-CM | POA: Diagnosis not present

## 2024-01-10 DIAGNOSIS — G4700 Insomnia, unspecified: Secondary | ICD-10-CM | POA: Diagnosis present

## 2024-01-10 DIAGNOSIS — Z888 Allergy status to other drugs, medicaments and biological substances status: Secondary | ICD-10-CM | POA: Diagnosis not present

## 2024-01-10 DIAGNOSIS — F32A Depression, unspecified: Secondary | ICD-10-CM | POA: Diagnosis present

## 2024-01-10 DIAGNOSIS — F419 Anxiety disorder, unspecified: Secondary | ICD-10-CM

## 2024-01-10 DIAGNOSIS — K219 Gastro-esophageal reflux disease without esophagitis: Secondary | ICD-10-CM | POA: Diagnosis present

## 2024-01-10 DIAGNOSIS — I82452 Acute embolism and thrombosis of left peroneal vein: Secondary | ICD-10-CM | POA: Diagnosis present

## 2024-01-10 DIAGNOSIS — J9601 Acute respiratory failure with hypoxia: Secondary | ICD-10-CM | POA: Diagnosis present

## 2024-01-10 DIAGNOSIS — I480 Paroxysmal atrial fibrillation: Secondary | ICD-10-CM | POA: Diagnosis present

## 2024-01-10 DIAGNOSIS — E785 Hyperlipidemia, unspecified: Secondary | ICD-10-CM | POA: Diagnosis present

## 2024-01-10 DIAGNOSIS — Z833 Family history of diabetes mellitus: Secondary | ICD-10-CM | POA: Diagnosis not present

## 2024-01-10 DIAGNOSIS — Z886 Allergy status to analgesic agent status: Secondary | ICD-10-CM | POA: Diagnosis not present

## 2024-01-10 DIAGNOSIS — Z85828 Personal history of other malignant neoplasm of skin: Secondary | ICD-10-CM | POA: Diagnosis not present

## 2024-01-10 DIAGNOSIS — I10 Essential (primary) hypertension: Secondary | ICD-10-CM | POA: Diagnosis present

## 2024-01-10 DIAGNOSIS — K7460 Unspecified cirrhosis of liver: Secondary | ICD-10-CM | POA: Diagnosis present

## 2024-01-10 DIAGNOSIS — Z7901 Long term (current) use of anticoagulants: Secondary | ICD-10-CM | POA: Diagnosis not present

## 2024-01-10 DIAGNOSIS — J90 Pleural effusion, not elsewhere classified: Secondary | ICD-10-CM | POA: Diagnosis present

## 2024-01-10 DIAGNOSIS — F0390 Unspecified dementia without behavioral disturbance: Secondary | ICD-10-CM | POA: Insufficient documentation

## 2024-01-10 DIAGNOSIS — Z66 Do not resuscitate: Secondary | ICD-10-CM | POA: Diagnosis present

## 2024-01-10 DIAGNOSIS — Z8249 Family history of ischemic heart disease and other diseases of the circulatory system: Secondary | ICD-10-CM | POA: Diagnosis not present

## 2024-01-10 DIAGNOSIS — Z1152 Encounter for screening for COVID-19: Secondary | ICD-10-CM | POA: Diagnosis not present

## 2024-01-10 DIAGNOSIS — F0394 Unspecified dementia, unspecified severity, with anxiety: Secondary | ICD-10-CM | POA: Diagnosis present

## 2024-01-10 LAB — RESPIRATORY PANEL BY PCR

## 2024-01-10 LAB — ECHOCARDIOGRAM COMPLETE
AR max vel: 1.51 cm2
AV Area VTI: 1.46 cm2
AV Area mean vel: 1.48 cm2
AV Mean grad: 5.5 mmHg
AV Peak grad: 9.7 mmHg
Ao pk vel: 1.56 m/s
Area-P 1/2: 4.63 cm2
Calc EF: 59.6 %
Height: 62 in
P 1/2 time: 560 ms
S' Lateral: 3.1 cm
Single Plane A2C EF: 64.1 %
Single Plane A4C EF: 55.7 %
Weight: 2220.8 [oz_av]

## 2024-01-10 LAB — TROPONIN T, HIGH SENSITIVITY
Troponin T High Sensitivity: 16 ng/L (ref 0–19)
Troponin T High Sensitivity: <15 ng/L (ref 0–19)

## 2024-01-10 LAB — BASIC METABOLIC PANEL WITH GFR
Anion gap: 11 (ref 5–15)
Anion gap: 13 (ref 5–15)
BUN: 8 mg/dL (ref 8–23)
BUN: 8 mg/dL (ref 8–23)
CO2: 23 mmol/L (ref 22–32)
CO2: 23 mmol/L (ref 22–32)
Calcium: 8.5 mg/dL — ABNORMAL LOW (ref 8.9–10.3)
Calcium: 9.5 mg/dL (ref 8.9–10.3)
Chloride: 104 mmol/L (ref 98–111)
Chloride: 106 mmol/L (ref 98–111)
Creatinine, Ser: 0.73 mg/dL (ref 0.44–1.00)
Creatinine, Ser: 0.82 mg/dL (ref 0.44–1.00)
GFR, Estimated: >60 mL/min
GFR, Estimated: >60 mL/min
Glucose, Bld: 109 mg/dL — ABNORMAL HIGH (ref 70–99)
Glucose, Bld: 123 mg/dL — ABNORMAL HIGH (ref 70–99)
Potassium: 3.6 mmol/L (ref 3.5–5.1)
Potassium: 3.6 mmol/L (ref 3.5–5.1)
Sodium: 139 mmol/L (ref 135–145)
Sodium: 140 mmol/L (ref 135–145)

## 2024-01-10 LAB — D-DIMER, QUANTITATIVE: D-Dimer, Quant: 2.13 ug{FEU}/mL — ABNORMAL HIGH (ref 0.00–0.50)

## 2024-01-10 LAB — CBC
HCT: 36 % (ref 36.0–46.0)
Hemoglobin: 11.8 g/dL — ABNORMAL LOW (ref 12.0–15.0)
MCH: 32.5 pg (ref 26.0–34.0)
MCHC: 32.8 g/dL (ref 30.0–36.0)
MCV: 99.2 fL (ref 80.0–100.0)
Platelets: 189 K/uL (ref 150–400)
RBC: 3.63 MIL/uL — ABNORMAL LOW (ref 3.87–5.11)
RDW: 12 % (ref 11.5–15.5)
WBC: 9.4 K/uL (ref 4.0–10.5)
nRBC: 0 % (ref 0.0–0.2)

## 2024-01-10 LAB — RESP PANEL BY RT-PCR (RSV, FLU A&B, COVID)  RVPGX2
Influenza A by PCR: NEGATIVE
Influenza B by PCR: NEGATIVE
Resp Syncytial Virus by PCR: NEGATIVE
SARS Coronavirus 2 by RT PCR: NEGATIVE

## 2024-01-10 LAB — PRO BRAIN NATRIURETIC PEPTIDE: Pro Brain Natriuretic Peptide: 282 pg/mL

## 2024-01-10 LAB — LACTIC ACID, PLASMA: Lactic Acid, Venous: 1 mmol/L (ref 0.5–1.9)

## 2024-01-10 MED ORDER — VITAMIN B-12 1000 MCG PO TABS
500.0000 ug | ORAL_TABLET | Freq: Every day | ORAL | Status: DC
  Administered 2024-01-10: 500 ug via ORAL
  Filled 2024-01-10: qty 1

## 2024-01-10 MED ORDER — ONDANSETRON HCL 4 MG/2ML IJ SOLN
4.0000 mg | Freq: Once | INTRAMUSCULAR | Status: AC
  Administered 2024-01-10: 4 mg via INTRAVENOUS
  Filled 2024-01-10: qty 2

## 2024-01-10 MED ORDER — SODIUM CHLORIDE 0.9 % IV SOLN: INTRAVENOUS | Status: DC

## 2024-01-10 MED ORDER — HEPARIN BOLUS VIA INFUSION
4000.0000 [IU] | Freq: Once | INTRAVENOUS | Status: AC
  Administered 2024-01-10: 4000 [IU] via INTRAVENOUS
  Filled 2024-01-10: qty 4000

## 2024-01-10 MED ORDER — AQUAPHOR EX OINT: TOPICAL_OINTMENT | Freq: Every day | CUTANEOUS | Status: DC

## 2024-01-10 MED ORDER — MAGNESIUM HYDROXIDE 400 MG/5ML PO SUSP: 5.0000 mL | ORAL | Status: DC | PRN

## 2024-01-10 MED ORDER — MELATONIN 5 MG PO TABS: 5.0000 mg | ORAL_TABLET | Freq: Every evening | ORAL | Status: DC | PRN

## 2024-01-10 MED ORDER — BUTALBITAL-APAP-CAFFEINE 50-325-40 MG PO TABS: 1.0000 | ORAL_TABLET | Freq: Two times a day (BID) | ORAL | Status: DC | PRN

## 2024-01-10 MED ORDER — POLYETHYLENE GLYCOL 3350 17 G PO PACK
17.0000 g | PACK | Freq: Every day | ORAL | Status: DC
  Administered 2024-01-10: 17 g via ORAL
  Filled 2024-01-10: qty 1

## 2024-01-10 MED ORDER — IOHEXOL 350 MG/ML SOLN
75.0000 mL | Freq: Once | INTRAVENOUS | Status: AC | PRN
  Administered 2024-01-10: 75 mL via INTRAVENOUS

## 2024-01-10 MED ORDER — ROSUVASTATIN CALCIUM 5 MG PO TABS
2.5000 mg | ORAL_TABLET | ORAL | Status: DC
  Administered 2024-01-10: 2.5 mg via ORAL
  Filled 2024-01-10: qty 1

## 2024-01-10 MED ORDER — BISMUTH SUBSALICYLATE 262 MG/15ML PO SUSP: 30.0000 mL | Freq: Three times a day (TID) | ORAL | Status: DC | PRN

## 2024-01-10 MED ORDER — DIPHENHYDRAMINE HCL 25 MG PO CAPS: 25.0000 mg | ORAL_CAPSULE | Freq: Four times a day (QID) | ORAL | Status: DC | PRN

## 2024-01-10 MED ORDER — SERTRALINE HCL 50 MG PO TABS
50.0000 mg | ORAL_TABLET | Freq: Every day | ORAL | Status: DC
  Administered 2024-01-10: 50 mg via ORAL
  Filled 2024-01-10: qty 1

## 2024-01-10 MED ORDER — ONDANSETRON HCL 4 MG/2ML IJ SOLN: 4.0000 mg | Freq: Four times a day (QID) | INTRAMUSCULAR | Status: DC | PRN

## 2024-01-10 MED ORDER — ACETAMINOPHEN 325 MG PO TABS: 650.0000 mg | ORAL_TABLET | Freq: Four times a day (QID) | ORAL | Status: DC | PRN

## 2024-01-10 MED ORDER — MECLIZINE HCL 25 MG PO TABS: 25.0000 mg | ORAL_TABLET | Freq: Three times a day (TID) | ORAL | Status: DC | PRN

## 2024-01-10 MED ORDER — CINACALCET HCL 30 MG PO TABS
30.0000 mg | ORAL_TABLET | ORAL | Status: DC
  Administered 2024-01-10: 30 mg via ORAL
  Filled 2024-01-10: qty 1

## 2024-01-10 MED ORDER — HEPARIN (PORCINE) 25000 UT/250ML-% IV SOLN
950.0000 [IU]/h | INTRAVENOUS | Status: DC
  Administered 2024-01-10: 950 [IU]/h via INTRAVENOUS
  Filled 2024-01-10: qty 250

## 2024-01-10 MED ORDER — APIXABAN 5 MG PO TABS: 5.0000 mg | ORAL_TABLET | Freq: Two times a day (BID) | ORAL | Status: DC

## 2024-01-10 MED ORDER — ACETAMINOPHEN 650 MG RE SUPP: 650.0000 mg | Freq: Four times a day (QID) | RECTAL | Status: DC | PRN

## 2024-01-10 MED ORDER — APIXABAN (ELIQUIS) VTE STARTER PACK (10MG AND 5MG): ORAL_TABLET | ORAL | 0 refills | Status: DC

## 2024-01-10 MED ORDER — LIDOCAINE 5 % EX PTCH
1.0000 | MEDICATED_PATCH | CUTANEOUS | Status: DC
  Filled 2024-01-10: qty 1

## 2024-01-10 MED ORDER — APIXABAN 5 MG PO TABS
10.0000 mg | ORAL_TABLET | Freq: Two times a day (BID) | ORAL | Status: DC
  Administered 2024-01-10: 10 mg via ORAL
  Filled 2024-01-10: qty 2

## 2024-01-10 MED ORDER — EZETIMIBE 10 MG PO TABS
10.0000 mg | ORAL_TABLET | Freq: Every day | ORAL | Status: DC
  Administered 2024-01-10: 10 mg via ORAL
  Filled 2024-01-10: qty 1

## 2024-01-10 MED ORDER — APIXABAN (ELIQUIS) VTE STARTER PACK (10MG AND 5MG): ORAL_TABLET | ORAL | 0 refills | Status: AC

## 2024-01-10 MED ORDER — MORPHINE SULFATE (PF) 2 MG/ML IV SOLN
2.0000 mg | Freq: Once | INTRAVENOUS | Status: AC
  Administered 2024-01-10: 2 mg via INTRAVENOUS
  Filled 2024-01-10: qty 1

## 2024-01-10 MED ORDER — ONDANSETRON HCL 4 MG PO TABS: 4.0000 mg | ORAL_TABLET | Freq: Four times a day (QID) | ORAL | Status: DC | PRN

## 2024-01-10 NOTE — Assessment & Plan Note (Signed)
Will continue Zoloft. 

## 2024-01-10 NOTE — Assessment & Plan Note (Signed)
-   The patient has subsequent acute respiratory failure with hypoxia. - The patient will be admitted to a progressive unit bed. - Will continue her on IV heparin . - We will obtain a 2D echo to assess for RV strain. - We will obtain bilateral lower extremity venous Doppler to rule out DVT. - O2 protocol will be followed.

## 2024-01-10 NOTE — Progress Notes (Signed)
 Mobility Specialist - Progress Note   01/10/24 1228  Mobility  Activity Ambulated with assistance;Stood at bedside  Level of Assistance Contact guard assist, steadying assist  Assistive Device None  Distance Ambulated (ft) 185 ft  Range of Motion/Exercises All extremities  Activity Response Tolerated well  Mobility visit 1 Mobility  Mobility Specialist Start Time (ACUTE ONLY) 1148  Mobility Specialist Stop Time (ACUTE ONLY) 1158  Mobility Specialist Time Calculation (min) (ACUTE ONLY) 10 min     Pt was at the EOB on RA upon entry. Pt agreed to mobility. Pt is able today to STS independently. Pt is a bit impulsive. Pt ambulated well with no AD. Pt O2 vitals were checked throughout activity as a precaution. Pt O2 vitals were WNL. After activity pt returned to the room back in bed at the EOB with needs in place and bed alarm on.  Clem Rodes Mobility Specialist 01/10/2024, 12:32 PM

## 2024-01-10 NOTE — TOC CM/SW Note (Signed)
" ° ° ° °  [facility] REFERRAL        Occupational Therapy * Physical Therapy * Speech Therapy                           DATE: 01/10/2024  PATIENT NAME: [name]  PATIENT MRN: [id number]        DIAGNOSIS: Acute pulmonary embolism without acute cor pulmonale   DATE OF DISCHARGE: 01/10/2024       PRIMARY CARE PHYSICIAN:  Cleatus Arlyss RAMAN, MD   PCP PHONE: [phone]     Dear Provider: Lavella Eric       Fax: [phone]   I certify that I have examined this patient and that occupational/physical/speech therapy is necessary on an outpatient basis.    The patient has expressed interest in completing their recommended course of therapy at your  location.  Once a formal order from the patient's primary care physician has been obtained, please  contact him/her to schedule an appointment for evaluation at your earliest convenience.   [email or web address] ]  Physical Therapy Evaluate and Treat  [  ]  Occupational Therapy Evaluate and Treat  [  ]  Speech Therapy Evaluate and Treat         The patient's primary care physician (listed above) must furnish and be responsible for a formal order such that the recommended services may be furnished while under the primary physician's care, and that the plan of care will be established and reviewed every 30 days (or more often if condition necessitates).   "

## 2024-01-10 NOTE — Evaluation (Signed)
 Physical Therapy Evaluation Patient Details Name: [name] MRN: [id number] DOB: March 04, 1932 Today's Date: 01/10/2024  History of Present Illness  presented to ER secondary to R parasternal chest pain, cough, dyspnea x1 week; admitted for management of acute PE, L peroneal DVT (initially treated with heparin  drip, bridged to eliquis ).  Cleared by MD for evaluation this date.  Clinical Impression  Patient resting in bed upon arrival to room; alert and oriented to self, month and year.  Unaware of location or general reason for admission.  Consistently follows all verbal commands; pleasant and cooperative throughout session. Bilat UE/LE strength and ROM grossly symmetrical and WFL for basic transfers and mobility; no focal weakness appreciated.  Able to complete bed mobility with mod indep; sit/stand, basic transfers and gait (200') without assist device, cga/close sup.  Demonstrates reciprocal stepping pattern with fair step height/length, fair/good trunk rotation and arm swing. Fair/good cadence. Mild sway with head turns and changes of direction, but self-corrects without difficulty. Mild/mod dyspnea with gait efforts; desat to 87-88% with gait, recovering >90% within 60 seconds of seated rest and pursed lip breathing.  Would benefit from skilled PT to address above deficits and promote optimal return to PLOF.; recommend post-acute PT follow up as indicated by interdisciplinary care team.          If plan is discharge home, recommend the following: A little help with walking and/or transfers;A little help with bathing/dressing/bathroom   Can travel by private vehicle        Equipment Recommendations None recommended by PT  Recommendations for Other Services       Functional Status Assessment Patient has had a recent decline in their functional status and demonstrates the ability to make significant improvements in function in a reasonable and predictable amount of time.      Precautions / Restrictions Precautions Precautions: Fall Restrictions Weight Bearing Restrictions Per Provider Order: No      Mobility  Bed Mobility Overal bed mobility: Modified Independent                  Transfers Overall transfer level: Needs assistance Equipment used: None Transfers: Sit to/from Stand Sit to Stand: Contact guard assist                Ambulation/Gait Ambulation/Gait assistance: Contact guard assist Gait Distance (Feet): 200 Feet Assistive device: None         General Gait Details: reciprocal stepping pattern with fair step height/length, fair/good trunk rotation and arm swing. Fair/good cadence.  Mild sway with head turns and changes of direction, but self-corrects without difficulty.  Mild/mod dyspnea with gait efforts; desat to 87-88% with gait, recovering >90% within 60 seconds of seated rest and pursed lip breathing.  Stairs            Wheelchair Mobility     Tilt Bed    Modified Rankin (Stroke Patients Only)       Balance Overall balance assessment: Needs assistance Sitting-balance support: No upper extremity supported, Feet supported Sitting balance-Leahy Scale: Good     Standing balance support: Bilateral upper extremity supported Standing balance-Leahy Scale: Good                               Pertinent Vitals/Pain Pain Assessment Pain Assessment: No/denies pain    Home Living                     Additional  Comments: Per chart, is resident of [facility]    Prior Function Prior Level of Function : Patient poor historian/Family not available             Mobility Comments: Patient limited historian, but denies use of assist device at baseline; no falls, no home O2       Extremity/Trunk Assessment   Upper Extremity Assessment Upper Extremity Assessment: Overall WFL for tasks assessed    Lower Extremity Assessment Lower Extremity Assessment: Overall WFL for  tasks assessed (grossly at least 4/5 throughout)       Communication   Communication Communication: No apparent difficulties    Cognition Arousal: Alert Behavior During Therapy: WFL for tasks assessed/performed   PT - Cognitive impairments: No family/caregiver present to determine baseline                       PT - Cognition Comments: Alert and oriented to self, month and year; unaware of location or overall reason for admission Following commands: Intact       Cueing Cueing Techniques: Verbal cues, Gestural cues     General Comments      Exercises     Assessment/Plan    PT Assessment Patient needs continued PT services  PT Problem List Decreased activity tolerance;Decreased balance;Decreased mobility;Decreased knowledge of use of DME;Decreased safety awareness;Decreased knowledge of precautions;Pain       PT Treatment Interventions DME instruction;Gait training;Stair training;Functional mobility training;Therapeutic exercise;Therapeutic activities;Balance training;Patient/family education    PT Goals (Current goals can be found in the Care Plan section)  Acute Rehab PT Goals Patient Stated Goal: to go home PT Goal Formulation: With patient Time For Goal Achievement: 01/24/24 Potential to Achieve Goals: Good    Frequency Min 2X/week     Co-evaluation               AM-PAC PT 6 Clicks Mobility  Outcome Measure Help needed turning from your back to your side while in a flat bed without using bedrails?: None Help needed moving from lying on your back to sitting on the side of a flat bed without using bedrails?: None Help needed moving to and from a bed to a chair (including a wheelchair)?: A Little Help needed standing up from a chair using your arms (e.g., wheelchair or bedside chair)?: A Little Help needed to walk in hospital room?: A Little Help needed climbing 3-5 steps with a railing? : A Little 6 Click Score: 20    End of Session    Activity Tolerance: Patient tolerated treatment well Patient left: in chair;with call bell/phone within reach;with chair alarm set Nurse Communication: Mobility status PT Visit Diagnosis: Muscle weakness (generalized) (M62.81);Difficulty in walking, not elsewhere classified (R26.2)    Time: 8786-8769 PT Time Calculation (min) (ACUTE ONLY): 17 min   Charges:   PT Evaluation $PT Eval Low Complexity: 1 Low   PT General Charges $$ ACUTE PT VISIT: 1 Visit        Vivianne Carles H. Delores, PT, DPT, NCS 01/10/2024, 1:18 PM [phone]

## 2024-01-10 NOTE — Assessment & Plan Note (Addendum)
-   Will continue statin therapy as well as Zetia .

## 2024-01-10 NOTE — Assessment & Plan Note (Signed)
 -  We will continue PPI therapy

## 2024-01-10 NOTE — Discharge Summary (Signed)
 [name] FMW:[id number] DOB: June 17, 1932 DOA: 01/09/2024  PCP: Cleatus Arlyss RAMAN, MD  Admit date: 01/09/2024 Discharge date: 01/10/2024  Time spent: 35 minutes  Recommendations for Outpatient Follow-up:  Pcp f/u 1 week     Discharge Diagnoses:  Principal Problem:   Acute pulmonary embolism without acute cor pulmonale (HCC) Active Problems:   Dyslipidemia   Anxiety and depression   Paroxysmal atrial fibrillation (HCC)   GERD without esophagitis   Acute DVT (deep venous thrombosis) (HCC)   Dementia (HCC)   Discharge Condition: stable  Diet recommendation: heart healthy  Filed Weights   01/10/24 0100 01/10/24 0128 01/10/24 0508  Weight: 65.2 kg 65.2 kg 63 kg    History of present illness:  From admission h and p [name] is a 89 y.o. pleasant Caucasian female with medical history significant for paroxysmal atrial fibrillation anxiety, GERD, hypertension, dyslipidemia, IBS and diverticulosis, who presented to the emergency room with acute onset of right parasternal chest pain felt as pressure and worsened with deep breathing as well as dyspnea with intermittent cough over the last week.  No fever or chills.  She has been having nausea without vomiting.  No dysuria, oliguria or hematuria or flank pain.  No bleeding diathesis.  Due to her cough she has been placed in isolation limited her mobility during the last week.  No recent travels or surgeries.  She denies any worsening lower extremity edema or pain.    Hospital Course:   Patient presents with chest pain and cough. Found to have small acute PE and DVT. No evidence right heart strains or signs of such, despite this TTE obtained, not surprisingly no significant findings. Reportedly hypoxic to upper 80s on arrival, started on oxygen. This was weaned off on hospital day 1. Patient ambulated satisfactorily with PT without significant desaturation or dyspnea. Home health PT advised. Resides at [facility] so will  have close supervision after discharge. Transitioned from heparin  to apixaban  on hospital day 1. Advise close pcp f/u. This dvt/PE appears to be unprovoked. For the cough PE is most likely cause. Covid/flu/rsv neg; 20-pathogen respiratory viral panel pending at time of discharge.  Procedures: none   Consultations: none  Discharge Exam: Vitals:   01/10/24 0805 01/10/24 1244  BP: (!) 125/51 (!) 129/54  Pulse: 74 62  Resp: 16 16  Temp: 97.8 F (36.6 C) 98 F (36.7 C)  SpO2: 98% 95%    General: NAD Cardiovascular: RRR Respiratory: few rhonchi  Discharge Instructions   Discharge Instructions     Increase activity slowly   Complete by: As directed       Allergies as of 01/10/2024       Reactions   Aspirin    Ciprofloxacin     Corticosteroids    Presumed cause of rash/itching.  Do not use prednisone    Nexium [esomeprazole Magnesium ]    Niacin And Related    Propylene Glycol    Vagifem [estradiol]    Allegra [fexofenadine]    Bacitracin-polymyxin B    Celebrex [celecoxib]    Doxepin Hcl    Erythromycin    Hydrocortisone Other (See Comments)   After use of 1% HC cream, pt reported burning and feeling scalded in vaginal area.   Levofloxacin    Levsin [hyoscyamine Sulfate]    Macrobid [nitrofurantoin Macrocrystal]    Marcaine [bupivacaine Hcl]    Nsaids    Prevacid [lansoprazole]    Able to tolerate omeprazole .    Pseudoephedrine    Sulfonamide Derivatives  Tavist [clemastine]    Cortisone Rash   Any hydrocortisone cream component with severe vaginal burning        Medication List     STOP taking these medications    valACYclovir  1000 MG tablet Commonly known as: VALTREX        TAKE these medications    acetaminophen  325 MG tablet Commonly known as: TYLENOL  Take 650 mg by mouth every 4 (four) hours as needed.   Apixaban  Starter Pack (10mg  and 5mg ) Commonly known as: ELIQUIS  STARTER PACK Take as directed on package: start with two-5mg   tablets twice daily for 7 days. On day 8, switch to one-5mg  tablet twice daily.   butalbital -acetaminophen -caffeine  50-325-40 MG tablet Commonly known as: FIORICET  TAKE 1 TABLET BY MOUTH 2 TIMES DAILY AS NEEDED.   calcium  carbonate 750 MG chewable tablet Commonly known as: TUMS EX Chew 1 tablet by mouth as needed for heartburn.   cinacalcet  30 MG tablet Commonly known as: SENSIPAR  1 tablet by mouth 5 days/week   dextromethorphan 30 MG/5ML liquid Commonly known as: DELSYM Take 10 mLs by mouth every 12 (twelve) hours as needed for cough.   diphenhydrAMINE  2 % cream Commonly known as: BENADRYL  Apply topically 3 (three) times daily as needed for itching.   diphenhydrAMINE  25 MG tablet Commonly known as: Benadryl  Allergy Take 1 tablet (25 mg total) by mouth every 6 (six) hours as needed.   ezetimibe  10 MG tablet Commonly known as: ZETIA  TAKE 1 TABLET BY MOUTH DAILY   hydrogen peroxide 1.5 % Soln 10 mg as needed.   lidocaine  5 % Commonly known as: Lidoderm  Place 1 patch onto the skin daily. Remove & Discard patch within 12 hours or as directed by MD   meclizine  25 MG tablet Commonly known as: ANTIVERT  Take 25 mg by mouth 3 (three) times daily as needed. For vertigo   melatonin 5 MG Tabs Take 5 mg by mouth.   Milk of Magnesia 7.75 % suspension Generic drug: magnesium  hydroxide Take 5 mLs by mouth as needed for mild constipation.   multivitamin tablet Take 1 tablet by mouth daily.   ondansetron  4 MG tablet Commonly known as: ZOFRAN  Take 4 mg by mouth every 8 (eight) hours as needed for nausea or vomiting.   Pepto-Bismol 262 MG/15ML suspension Generic drug: bismuth  subsalicylate Take 30 mLs by mouth 3 (three) times daily as needed.   polyethylene glycol 17 g packet Commonly known as: MIRALAX  / GLYCOLAX  Take 17 g by mouth daily.   rosuvastatin  5 MG tablet Commonly known as: CRESTOR  TAKE 1/2 TABLET BY MOUTH 5 DAYS A WEEK   sertraline  50 MG tablet Commonly  known as: ZOLOFT  Take 1 tablet (50 mg total) by mouth daily.       Allergies[1]  Follow-up Information     Cleatus Arlyss RAMAN, MD Follow up.   Specialty: Family Medicine Why: 1 week Contact information: [address] [place] [address] [phone]                  The results of significant diagnostics from this hospitalization (including imaging, microbiology, ancillary and laboratory) are listed below for reference.    Significant Diagnostic Studies: ECHOCARDIOGRAM COMPLETE Result Date: 01/10/2024    ECHOCARDIOGRAM REPORT   Patient Name:   ARRYN TERRONES Date of Exam: 01/10/2024 Medical Rec #:  [id number]          Height:       62.0 in Accession #:    [id number]  Weight:       138.8 lb Date of Birth:  [birth year]           BSA:          1.637 m Patient Age:    89 years           BP: Patient Gender: F                  HR: Exam Location: Procedure: (Both Spectral and Color Flow Doppler were utilized during            procedure).  Referring Phys: [id number] JAN A MANSY IMPRESSIONS  1. Left ventricular ejection fraction, by estimation, is 65 to 70%. The left ventricle has normal function. The left ventricle has no regional wall motion abnormalities. The left ventricular internal cavity size was mildly to moderately dilated. Left ventricular diastolic parameters are consistent with Grade II diastolic dysfunction (pseudonormalization).  2. Right ventricular systolic function is normal. The right ventricular size is normal. There is mildly elevated pulmonary artery systolic pressure.  3. Left atrial size was mild to moderately dilated.  4. Right atrial size was mildly dilated.  5. The mitral valve is myxomatous. Mild mitral valve regurgitation.  6. The aortic valve is calcified. Aortic valve regurgitation is mild. Aortic valve sclerosis/calcification is present, without any evidence of aortic stenosis. FINDINGS  Left Ventricle: Left ventricular ejection fraction, by  estimation, is 65 to 70%. The left ventricle has normal function. The left ventricle has no regional wall motion abnormalities. Strain was performed and the global longitudinal strain is indeterminate. The left ventricular internal cavity size was mildly to moderately dilated. There is no left ventricular hypertrophy. Left ventricular diastolic parameters are consistent with Grade II diastolic dysfunction (pseudonormalization). Right Ventricle: The right ventricular size is normal. No increase in right ventricular wall thickness. Right ventricular systolic function is normal. There is mildly elevated pulmonary artery systolic pressure. The tricuspid regurgitant velocity is 2.92  m/s, and with an assumed right atrial pressure of 3 mmHg, the estimated right ventricular systolic pressure is 37.1 mmHg. Left Atrium: Left atrial size was mild to moderately dilated. Right Atrium: Right atrial size was mildly dilated. Pericardium: There is no evidence of pericardial effusion. Mitral Valve: The mitral valve is myxomatous. Mild mitral valve regurgitation. Tricuspid Valve: The tricuspid valve is normal in structure. Tricuspid valve regurgitation is mild. Aortic Valve: The aortic valve is calcified. Aortic valve regurgitation is mild. Aortic regurgitation PHT measures 560 msec. Aortic valve sclerosis/calcification is present, without any evidence of aortic stenosis. Aortic valve mean gradient measures 5.5  mmHg. Aortic valve peak gradient measures 9.7 mmHg. Aortic valve area, by VTI measures 1.46 cm. Pulmonic Valve: The pulmonic valve was not well visualized. Pulmonic valve regurgitation is trivial. Aorta: The ascending aorta was not well visualized. IAS/Shunts: No atrial level shunt detected by color flow Doppler. Additional Comments: 3D was performed not requiring image post processing on an independent workstation and was indeterminate.  LEFT VENTRICLE PLAX 2D LVIDd:         5.20 cm     Diastology LVIDs:         3.10 cm      LV e' medial:    6.64 cm/s LV PW:         0.90 cm     LV E/e' medial:  12.6 LV IVS:        1.00 cm     LV e' lateral:   9.14 cm/s LVOT diam:  1.80 cm     LV E/e' lateral: 9.2 LV SV:         52 LV SV Index:   32 LVOT Area:     2.54 cm LV IVRT:       92 msec  LV Volumes (MOD) LV vol d, MOD A2C: 61.5 ml LV vol d, MOD A4C: 69.6 ml LV vol s, MOD A2C: 22.1 ml LV vol s, MOD A4C: 30.8 ml LV SV MOD A2C:     39.4 ml LV SV MOD A4C:     69.6 ml LV SV MOD BP:      39.2 ml RIGHT VENTRICLE             IVC RV Basal diam:  3.60 cm     IVC diam: 1.40 cm RV Mid diam:    2.40 cm RV S prime:     15.20 cm/s TAPSE (M-mode): 2.6 cm LEFT ATRIUM             Index        RIGHT ATRIUM           Index LA diam:        4.30 cm 2.63 cm/m   RA Area:     21.20 cm LA Vol (A2C):   54.1 ml 33.05 ml/m  RA Volume:   60.70 ml  37.08 ml/m LA Vol (A4C):   83.5 ml 51.01 ml/m LA Biplane Vol: 73.9 ml 45.15 ml/m  AORTIC VALVE                     PULMONIC VALVE AV Area (Vmax):    1.51 cm      PV Vmax:          0.82 m/s AV Area (Vmean):   1.48 cm      PV Peak grad:     2.7 mmHg AV Area (VTI):     1.46 cm      PR End Diast Vel: 4.33 msec AV Vmax:           156.00 cm/s AV Vmean:          108.000 cm/s AV VTI:            0.355 m AV Peak Grad:      9.7 mmHg AV Mean Grad:      5.5 mmHg LVOT Vmax:         92.80 cm/s LVOT Vmean:        62.900 cm/s LVOT VTI:          0.204 m LVOT/AV VTI ratio: 0.58 AI PHT:            560 msec  AORTA Ao Root diam: 3.00 cm Ao Asc diam:  3.50 cm MITRAL VALVE               TRICUSPID VALVE MV Area (PHT): 4.63 cm    TR Peak grad:   34.1 mmHg MV Decel Time: 164 msec    TR Vmax:        292.00 cm/s MV E velocity: 83.80 cm/s MV A velocity: 63.90 cm/s  SHUNTS MV E/A ratio:  1.31        Systemic VTI:  0.20 m                            Systemic Diam: 1.80 cm Dwayne JONETTA Lovelace MD Electronically signed by Cara JONETTA Lovelace MD Signature Date/Time: 01/10/2024/9:27:48 AM  Final    US  Venous Img Lower Bilateral (DVT) Result Date:  01/10/2024 EXAM: ULTRASOUND DUPLEX OF THE BILATERAL LOWER EXTREMITY VEINS TECHNIQUE: Duplex ultrasound using B-mode/gray scaled imaging and Doppler spectral analysis and color flow was obtained of the deep venous structures of the bilateral lower extremity. COMPARISON: None available. CLINICAL HISTORY: Pulmonary embolus (HCC). FINDINGS: LEFT: The common femoral vein, femoral vein, popliteal vein, and posterior tibial vein demonstrate normal compressibility with normal color flow and spectral analysis. On the left, there is deep vein thrombosis (DVT) in 1 of the paired peroneal veins. The other visualized deep veins of the left lower extremity demonstrate normal compressibility and Doppler flow. RIGHT: The common femoral vein, femoral vein, popliteal vein, posterior tibial vein, and peroneal veins demonstrate normal compressibility with normal color flow and spectral analysis. INCIDENTAL FINDINGS: Bilateral Baker's cysts are present. The right Baker's cyst measures 3.0 x 1.4 x 4.1 cm. The left Baker's cyst measures 5.0 x 1.9 x 3.5 cm. IMPRESSION: 1. DVT in 1 of the paired left peroneal veins. 2. Bilateral baker's cysts. 3. Results called to Dr. Lawence at 4:00 AM on 12 / 25 / 25. Electronically signed by: Norman Gatlin MD 01/10/2024 04:01 AM EST RP Workstation: [id number]   CT Angio Chest PE W and/or Wo Contrast Result Date: 01/10/2024 CLINICAL DATA:  Pulmonary embolism (PE) suspected, low to intermediate prob, positive D-dimer 89 year old with left-sided pain. EXAM: CT ANGIOGRAPHY CHEST WITH CONTRAST TECHNIQUE: Multidetector CT imaging of the chest was performed using the standard protocol during bolus administration of intravenous contrast. Multiplanar CT image reconstructions and MIPs were obtained to evaluate the vascular anatomy. RADIATION DOSE REDUCTION: This exam was performed according to the departmental dose-optimization program which includes automated exposure control, adjustment of the mA and/or kV  according to patient size and/or use of iterative reconstruction technique. CONTRAST:  75mL OMNIPAQUE  IOHEXOL  350 MG/ML SOLN COMPARISON:  Radiograph yesterday FINDINGS: Cardiovascular: Small acute pulmonary emboli in the right lung. Segmental filling defect within the medial right middle lobe, series 5, image 125-127. subsegmental filling defect within the posterior basal lower lobe series 5, image 157. Thromboembolic burden is small. Would not assess for right heart strain given peripheral location. The heart is enlarged. There are coronary artery calcifications. Aortic atherosclerosis. No pericardial effusion. Mediastinum/Nodes: 12 mm right hilar node series 4, image 53. No enlarged mediastinal lymph nodes. Small hiatal hernia. Lungs/Pleura: No pulmonary infarct. Small right pleural effusion. Mild heterogeneous pulmonary parenchyma. No features of pulmonary edema. 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 Upper Abdomen: Cholecystectomy. Atherosclerosis of the abdominal aorta. Musculoskeletal: There are no acute or suspicious osseous abnormalities. Review of the MIP images confirms the above findings. IMPRESSION: 1. Small acute pulmonary emboli in the right lung. Thromboembolic burden is small. 2. Small right pleural effusion. 3. Cardiomegaly. Coronary artery calcifications. 4. 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 Aortic Atherosclerosis (ICD10-I70.0). Critical Value/emergent results were called by telephone at the time of interpretation on 01/10/2024 at 1:09 am to provider [facility] , who verbally acknowledged these results. Electronically Signed   By: Andrea Gasman M.D.   On: 01/10/2024 01:09   DG Chest Port 1 View Result Date: 01/09/2024 EXAM: 2 VIEW(S) XRAY OF THE CHEST 01/09/2024 11:50:00 PM COMPARISON: AP and lateral chest 03/24/2022. CLINICAL HISTORY: Chest pain and shortening of breath  . FINDINGS:  LINES, TUBES AND DEVICES: Multiple overlying telemetry leads. LUNGS AND PLEURA: There is a low inspiration on. exam. Asymmetric  coarse interstitial change in the left base could be due to low lung volumes or infiltrate. The lungs are hypoexpanded, otherwise clear . No substantial pleural effusion. No pneumothorax. HEART AND MEDIASTINUM: There is atherosclerosis and slight tortuosity of the aorta with a stable mediastinum. Mild cardiomegaly with no evidence of CHF. BONES AND SOFT TISSUES: Osteopenia and degenerative changes. IMPRESSION: 1. Asymmetric coarse interstitial change in the left base, possibly due to low lung volumes or infiltrate. Repeat study recommended in full inspiration, preferably with 2 views if possible. 2. Mild cardiomegaly without evidence of congestive heart failure. Electronically signed by: Francis Quam MD 01/09/2024 11:57 PM EST RP Workstation: [id number]    Microbiology: Recent Results (from the past 240 hours)  Resp panel by RT-PCR (RSV, Flu A&B, Covid) Anterior Nasal Swab     Status: None   Collection Time: 01/09/24 11:31 PM   Specimen: Anterior Nasal Swab  Result Value Ref Range Status   SARS Coronavirus 2 by RT PCR NEGATIVE NEGATIVE Final    Comment: (NOTE) SARS-CoV-2 target nucleic acids are NOT DETECTED.  The SARS-CoV-2 RNA is generally detectable in upper respiratory specimens during the acute phase of infection. The lowest concentration of SARS-CoV-2 viral copies this assay can detect is 138 copies/mL. A negative result does not preclude SARS-Cov-2 infection and should not be used as the sole basis for treatment or other patient management decisions. A negative result may occur with  improper specimen collection/handling, submission of specimen other than nasopharyngeal swab, presence of viral mutation(s) within the areas targeted by this assay, and inadequate number of viral copies(<138 copies/mL). A negative result must be combined with clinical  observations, patient history, and epidemiological information. The expected result is Negative.  Fact Sheet for Patients:  bloggercourse.com  Fact Sheet for Healthcare Providers:  seriousbroker.it  This test is no t yet approved or cleared by the [place]  FDA and  has been authorized for detection and/or diagnosis of SARS-CoV-2 by FDA under an Emergency Use Authorization (EUA). This EUA will remain  in effect (meaning this test can be used) for the duration of the COVID-19 declaration under Section 564(b)(1) of the Act, 21 U.S.C.section 360bbb-3(b)(1), unless the authorization is terminated  or revoked sooner.       Influenza A by PCR NEGATIVE NEGATIVE Final   Influenza B by PCR NEGATIVE NEGATIVE Final    Comment: (NOTE) The Xpert Xpress SARS-CoV-2/FLU/RSV plus assay is intended as an aid in the diagnosis of influenza from Nasopharyngeal swab specimens and should not be used as a sole basis for treatment. Nasal washings and aspirates are unacceptable for Xpert Xpress SARS-CoV-2/FLU/RSV testing.  Fact Sheet for Patients: bloggercourse.com  Fact Sheet for Healthcare Providers: seriousbroker.it  This test is not yet approved or cleared by the [place]  FDA and has been authorized for detection and/or diagnosis of SARS-CoV-2 by FDA under an Emergency Use Authorization (EUA). This EUA will remain in effect (meaning this test can be used) for the duration of the COVID-19 declaration under Section 564(b)(1) of the Act, 21 U.S.C. section 360bbb-3(b)(1), unless the authorization is terminated or revoked.     Resp Syncytial Virus by PCR NEGATIVE NEGATIVE Final    Comment: (NOTE) Fact Sheet for Patients: bloggercourse.com  Fact Sheet for Healthcare Providers: seriousbroker.it  This test is not yet approved or cleared by  the [place]  FDA and has been authorized for detection and/or diagnosis of SARS-CoV-2 by FDA under an Emergency Use Authorization (EUA). This EUA will remain in effect (meaning this  test can be used) for the duration of the COVID-19 declaration under Section 564(b)(1) of the Act, 21 U.S.C. section 360bbb-3(b)(1), unless the authorization is terminated or revoked.  Performed at [facility], [address] [place], [place] [address]   Blood culture (routine x 2)     Status: None (Preliminary result)   Collection Time: 01/10/24  1:26 AM   Specimen: BLOOD  Result Value Ref Range Status   Specimen Description BLOOD LEFT ANTECUBITAL  Final   Special Requests   Final    BOTTLES DRAWN AEROBIC AND ANAEROBIC Blood Culture results may not be optimal due to an inadequate volume of blood received in culture bottles   Culture   Final    NO GROWTH < 12 HOURS Performed at [facility], [address], [place], [place] [address]    Report Status PENDING  Incomplete  Blood culture (routine x 2)     Status: None (Preliminary result)   Collection Time: 01/10/24  1:26 AM   Specimen: BLOOD  Result Value Ref Range Status   Specimen Description BLOOD BLOOD LEFT ARM  Final   Special Requests   Final    BOTTLES DRAWN AEROBIC AND ANAEROBIC Blood Culture adequate volume   Culture   Final    NO GROWTH < 12 HOURS Performed at [facility], [address], [place], [place] [address]    Report Status PENDING  Incomplete     Labs: Basic Metabolic Panel: Recent Labs  Lab 01/09/24 2331 01/10/24 0519  NA 139 140  K 3.6 3.6  CL 104 106  CO2 23 23  GLUCOSE 109* 123*  BUN 8 8  CREATININE 0.82 0.73  CALCIUM  9.5 8.5*   Liver Function Tests: No results for input(s): AST, ALT, ALKPHOS, BILITOT, PROT, ALBUMIN in the last 168 hours. No results for input(s): LIPASE, AMYLASE in the last 168 hours. No results for input(s): AMMONIA in the last 168  hours. CBC: Recent Labs  Lab 01/09/24 2331 01/10/24 0519  WBC 9.2 9.4  HGB 13.2 11.8*  HCT 38.4 36.0  MCV 95.8 99.2  PLT 212 189   Cardiac Enzymes: No results for input(s): CKTOTAL, CKMB, CKMBINDEX, TROPONINI in the last 168 hours. BNP: BNP (last 3 results) No results for input(s): BNP in the last 8760 hours.  ProBNP (last 3 results) Recent Labs    01/10/24 0038  PROBNP 282.0    CBG: No results for input(s): GLUCAP in the last 168 hours.     Signed:  Devaughn KATHEE Ban MD.  [facility] 01/10/2024, 12:52 PM     [1]  Allergies Allergen Reactions   Aspirin    Ciprofloxacin     Corticosteroids     Presumed cause of rash/itching.  Do not use prednisone    Nexium [Esomeprazole Magnesium ]    Niacin And Related    Propylene Glycol    Vagifem [Estradiol]    Allegra [Fexofenadine]    Bacitracin-Polymyxin B    Celebrex [Celecoxib]    Doxepin Hcl    Erythromycin    Hydrocortisone Other (See Comments)    After use of 1% HC cream, pt reported burning and feeling scalded in vaginal area.   Levofloxacin    Levsin [Hyoscyamine Sulfate]    Macrobid [Nitrofurantoin Macrocrystal]    Marcaine [Bupivacaine Hcl]    Nsaids    Prevacid [Lansoprazole]     Able to tolerate omeprazole .    Pseudoephedrine    Sulfonamide Derivatives    Tavist [Clemastine]    Cortisone  Rash    Any hydrocortisone cream component with severe vaginal burning

## 2024-01-10 NOTE — Progress Notes (Signed)
 ANTICOAGULATION CONSULT NOTE  Pharmacy Consult for Eliquis  Indication: pulmonary embolus  Allergies[1]  Patient Measurements: Height: 5' 2 (157.5 cm) Weight: 63 kg (138 lb 12.8 oz) IBW/kg (Calculated) : 50.1 HEPARIN  DW (KG): 62.7  Labs: Recent Labs    01/09/24 2331 01/10/24 0519  HGB 13.2 11.8*  HCT 38.4 36.0  PLT 212 189  CREATININE 0.82 0.73    Estimated Creatinine Clearance: 41.6 mL/min (by C-G formula based on SCr of 0.73 mg/dL).  Medical History: Past Medical History:  Diagnosis Date   Anxiety    AR (allergic rhinitis)    Atrial fibrillation (HCC)    Basal cell carcinoma of ala nasi 2014   Benign neoplasm stomach body    Cataract    corrected with surgery   Colitis    CTS (carpal tunnel syndrome)    Diverticulitis    Diverticulosis    Esophageal stricture    Frequent headaches    likely migraine   GERD (gastroesophageal reflux disease)    Hepatic steatosis    Hyperlipidemia    Hypertension    IBS (irritable bowel syndrome)    Insomnia    Internal hemorrhoids    Osteopenia    Assessment: Pt is a 89 yo female presenting to ED c/o left sided chest pain, pain with inspiration, found with small acute pulmonary emboli in the right lung. Thromboembolic burden is small. Pharmacy consulted to dose apixaban .   Plan:  --Apixaban  10 mg BID x 7 days followed by apixaban  5 mg BID for remaining duration of therapy --CBC per protocol while inpatient  [name] 01/10/2024 9:29 AM    [1]  Allergies Allergen Reactions   Aspirin    Ciprofloxacin     Corticosteroids     Presumed cause of rash/itching.  Do not use prednisone    Nexium [Esomeprazole Magnesium ]    Niacin And Related    Propylene Glycol    Vagifem [Estradiol]    Allegra [Fexofenadine]    Bacitracin-Polymyxin B    Celebrex [Celecoxib]    Doxepin Hcl    Erythromycin    Hydrocortisone Other (See Comments)    After use of 1% HC cream, pt reported burning and feeling scalded in vaginal  area.   Levofloxacin    Levsin [Hyoscyamine Sulfate]    Macrobid [Nitrofurantoin Macrocrystal]    Marcaine [Bupivacaine Hcl]    Nsaids    Prevacid [Lansoprazole]     Able to tolerate omeprazole .    Pseudoephedrine    Sulfonamide Derivatives    Tavist [Clemastine]    Cortisone Rash    Any hydrocortisone cream component with severe vaginal burning

## 2024-01-10 NOTE — TOC Transition Note (Signed)
 Transition of Care [facility]) - Discharge Note   Patient Details  Name: [name] MRN: [id number] Date of Birth: 1932/12/02  [facility] CM/SW Contact:  Victory Jackquline RAMAN, RN Phone Number: 01/10/2024, 2:00 PM   Clinical Narrative:    Patient discharging back to [facility] with HH/PT. RNCM called daughter/Lisa @ [phone] to inform her that that patient was discharging back to [facility] she stated that she will be transporting her. PT referral faxed to [facility] @ [phone]. Pt has discharge orders, no further concerns. RNCM signing off.  Final next level of care: Assisted Living Barriers to Discharge: Barriers Resolved   Patient Goals and CMS Choice            Discharge Placement                Patient to be transferred to facility by: Daughter Olam Name of family member notified: Olam Patient and family notified of of transfer: 01/10/24  Discharge Plan and Services Additional resources added to the After Visit Summary for                                       Social Drivers of Health (SDOH) Interventions SDOH Screenings   Food Insecurity: No Food Insecurity (10/23/2023)  Housing: Low Risk (10/23/2023)  Transportation Needs: No Transportation Needs (10/23/2023)  Utilities: Not At Risk (02/07/2022)  Depression (PHQ2-9): High Risk (05/25/2022)  Financial Resource Strain: Low Risk (10/23/2023)  Physical Activity: Inactive (10/23/2023)  Social Connections: Socially Isolated (10/23/2023)  Stress: Stress Concern Present (10/23/2023)  Tobacco Use: Low Risk (10/25/2023)     Readmission Risk Interventions     No data to display

## 2024-01-10 NOTE — H&P (Signed)
 "     [name]   PATIENT NAME: [name]    MR#:  [id number]  DATE OF BIRTH:  10-13-1932  DATE OF ADMISSION:  01/09/2024  PRIMARY CARE PHYSICIAN: Cleatus Arlyss RAMAN, MD   Patient is coming from: [facility].  REQUESTING/REFERRING PHYSICIAN: Ward, Josette SAILOR, DO  CHIEF COMPLAINT:   Chief Complaint  Patient presents with   Chest Pain    HISTORY OF PRESENT ILLNESS:  [name] is a 89 y.o. pleasant Caucasian female with medical history significant for paroxysmal atrial fibrillation anxiety, GERD, hypertension, dyslipidemia, IBS and diverticulosis, who presented to the emergency room with acute onset of right parasternal chest pain felt as pressure and worsened with deep breathing as well as dyspnea with intermittent cough over the last week.  No fever or chills.  She has been having nausea without vomiting.  No dysuria, oliguria or hematuria or flank pain.  No bleeding diathesis.  Due to her cough she has been placed in isolation limited her mobility during the last week.  No recent travels or surgeries.  She denies any worsening lower extremity edema or pain.  ED Course: When the patient came to the ER, BP was 144/66 with otherwise normal vital signs.  Labs revealed unremarkable BMP.  proBNP was normal at 282 and high-sensitivity troponin T was 16.  CBC was normal.  Quantitative D-dimer came back elevated at 2.13 and respiratory panel came back negative.  Blood cultures were drawn. EKG as reviewed by me : EKG showed normal sinus rhythm with rate of 69 with left axis deviation and low voltage QRS. Imaging: Portable chest x-ray showed the following: 1. Asymmetric coarse interstitial change in the left base, possibly due to low lung volumes or infiltrate. Repeat study recommended in full inspiration, preferably with 2 views if possible. 2. Mild cardiomegaly without evidence of congestive heart failure.  Chest CTA revealed the following: 1. Small acute pulmonary emboli  in the right lung. Thromboembolic burden is small. 2. Small right pleural effusion. 3. Cardiomegaly. Coronary artery calcifications. 4. 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  5. Aortic Atherosclerosis.  The patient was given 4 mg of IV Zofran  and 2 mg of IV morphine  sulfate and was started on IV heparin  with a bolus followed by drip.  She will be admitted to a progressive unit bed for further evaluation and management.  PAST MEDICAL HISTORY:   Past Medical History:  Diagnosis Date   Anxiety    AR (allergic rhinitis)    Atrial fibrillation (HCC)    Basal cell carcinoma of ala nasi 2014   Benign neoplasm stomach body    Cataract    corrected with surgery   Colitis    CTS (carpal tunnel syndrome)    Diverticulitis    Diverticulosis    Esophageal stricture    Frequent headaches    likely migraine   GERD (gastroesophageal reflux disease)    Hepatic steatosis    Hyperlipidemia    Hypertension    IBS (irritable bowel syndrome)    Insomnia    Internal hemorrhoids    Osteopenia     PAST SURGICAL HISTORY:   Past Surgical History:  Procedure Laterality Date   CATARACT EXTRACTION, BILATERAL     August 2018, October 2018   CHOLECYSTECTOMY     MOHS SURGERY  2014   nose    SOCIAL HISTORY:   Social History   Tobacco Use   Smoking status: Never  Smokeless tobacco: Never  Substance Use Topics   Alcohol use: No    FAMILY HISTORY:   Family History  Problem Relation Age of Onset   Cancer Other    Diabetes Other    CAD Brother    Leukemia Father    Pancreatic cancer Brother    CAD Mother    Breast cancer Sister    Mesothelioma Brother    Colon cancer Neg Hx    Hypercalcemia Neg Hx     DRUG ALLERGIES:  Allergies[1]  REVIEW OF SYSTEMS:   ROS As per history of present illness. All pertinent systems were reviewed above. Constitutional, HEENT, cardiovascular, respiratory, GI, GU, musculoskeletal, neuro,  psychiatric, endocrine, integumentary and hematologic systems were reviewed and are otherwise negative/unremarkable except for positive findings mentioned above in the HPI.   MEDICATIONS AT HOME:   Prior to Admission medications  Medication Sig Start Date End Date Taking? Authorizing Provider  acetaminophen  (TYLENOL ) 325 MG tablet Take 650 mg by mouth every 4 (four) hours as needed.    [provider]  bismuth  subsalicylate (PEPTO-BISMOL) 262 MG/15ML suspension Take 30 mLs by mouth 3 (three) times daily as needed. 03/10/22   Cleatus Arlyss RAMAN, MD  butalbital -acetaminophen -caffeine  (FIORICET ) 50-325-40 MG tablet TAKE 1 TABLET BY MOUTH 2 TIMES DAILY AS NEEDED. 04/19/21   Cleatus Arlyss RAMAN, MD  cinacalcet  (SENSIPAR ) 30 MG tablet 1 tablet by mouth 5 days/week 05/29/22   Cleatus Arlyss RAMAN, MD  cyanocobalamin  (VITAMIN B12) 500 MCG tablet Take 1 tablet (500 mcg total) by mouth daily. 05/25/22   Cleatus Arlyss RAMAN, MD  diphenhydrAMINE  (BENADRYL  ALLERGY) 25 MG tablet Take 1 tablet (25 mg total) by mouth every 6 (six) hours as needed. 09/05/21   Cleatus Arlyss RAMAN, MD  diphenhydrAMINE  (BENADRYL ) 2 % cream Apply topically 3 (three) times daily as needed for itching. 10/24/21   Cleatus Arlyss RAMAN, MD  ezetimibe  (ZETIA ) 10 MG tablet TAKE 1 TABLET BY MOUTH DAILY 02/28/21   Cleatus Arlyss RAMAN, MD  lidocaine  (LIDODERM ) 5 % Place 1 patch onto the skin daily. Remove & Discard patch within 12 hours or as directed by MD 10/25/23   Cleatus Arlyss RAMAN, MD  magnesium  hydroxide (MILK OF MAGNESIA) 400 MG/5ML suspension Take 5 mLs by mouth as needed for mild constipation. 09/05/21   Cleatus Arlyss RAMAN, MD  meclizine  (ANTIVERT ) 25 MG tablet Take 25 mg by mouth 3 (three) times daily as needed. For vertigo    [provider]  melatonin 5 MG TABS Take 5 mg by mouth.    [provider]  mineral oil-hydrophilic petrolatum (AQUAPHOR) ointment Apply topically at bedtime. 06/26/23   Cleatus Arlyss RAMAN, MD  Multiple Vitamin  (MULTIVITAMIN) tablet Take 1 tablet by mouth daily.    [provider]  polyethylene glycol (MIRALAX  / GLYCOLAX ) 17 g packet Take 17 g by mouth daily. 02/11/22   Von Bellis, MD  rosuvastatin  (CRESTOR ) 5 MG tablet TAKE 1/2 TABLET BY MOUTH 5 DAYS A WEEK 03/28/21   Cleatus Arlyss RAMAN, MD  sertraline  (ZOLOFT ) 50 MG tablet Take 1 tablet (50 mg total) by mouth daily. 10/25/23   Cleatus Arlyss RAMAN, MD      VITAL SIGNS:  Blood pressure 123/64, pulse 72, temperature 98 F (36.7 C), temperature source Oral, resp. rate 20, weight 65.2 kg, last menstrual period 01/17/1988, SpO2 100%.  PHYSICAL EXAMINATION:  Physical Exam  GENERAL:  89 y.o.-year-old pleasant Caucasian female patient lying in the bed with no acute distress.  EYES: Pupils equal,  round, reactive to light and accommodation. No scleral icterus. Extraocular muscles intact.  HEENT: Head atraumatic, normocephalic. Oropharynx and nasopharynx clear.  NECK:  Supple, no jugular venous distention. No thyroid  enlargement, no tenderness.  LUNGS: Normal breath sounds bilaterally, no wheezing, rales,rhonchi or crepitation. No use of accessory muscles of respiration.  CARDIOVASCULAR: Regular rate and rhythm, S1, S2 normal. No murmurs, rubs, or gallops.  ABDOMEN: Soft, nondistended, nontender. Bowel sounds present. No organomegaly or mass.  EXTREMITIES: No pedal edema, cyanosis, or clubbing.  NEUROLOGIC: Cranial nerves II through XII are intact. Muscle strength 5/5 in all extremities. Sensation intact. Gait not checked.  PSYCHIATRIC: The patient is alert and oriented x 3.  Normal affect and good eye contact. SKIN: No obvious rash, lesion, or ulcer.   LABORATORY PANEL:   CBC Recent Labs  Lab 01/09/24 2331  WBC 9.2  HGB 13.2  HCT 38.4  PLT 212   ------------------------------------------------------------------------------------------------------------------  Chemistries  Recent Labs  Lab 01/09/24 2331  NA 139  K 3.6  CL 104  CO2 23   GLUCOSE 109*  BUN 8  CREATININE 0.82  CALCIUM  9.5   ------------------------------------------------------------------------------------------------------------------  Cardiac Enzymes No results for input(s): TROPONINI in the last 168 hours. ------------------------------------------------------------------------------------------------------------------  RADIOLOGY:  CT Angio Chest PE W and/or Wo Contrast Result Date: 01/10/2024 CLINICAL DATA:  Pulmonary embolism (PE) suspected, low to intermediate prob, positive D-dimer 89 year old with left-sided pain. EXAM: CT ANGIOGRAPHY CHEST WITH CONTRAST TECHNIQUE: Multidetector CT imaging of the chest was performed using the standard protocol during bolus administration of intravenous contrast. Multiplanar CT image reconstructions and MIPs were obtained to evaluate the vascular anatomy. RADIATION DOSE REDUCTION: This exam was performed according to the departmental dose-optimization program which includes automated exposure control, adjustment of the mA and/or kV according to patient size and/or use of iterative reconstruction technique. CONTRAST:  75mL OMNIPAQUE  IOHEXOL  350 MG/ML SOLN COMPARISON:  Radiograph yesterday FINDINGS: Cardiovascular: Small acute pulmonary emboli in the right lung. Segmental filling defect within the medial right middle lobe, series 5, image 125-127. subsegmental filling defect within the posterior basal lower lobe series 5, image 157. Thromboembolic burden is small. Would not assess for right heart strain given peripheral location. The heart is enlarged. There are coronary artery calcifications. Aortic atherosclerosis. No pericardial effusion. Mediastinum/Nodes: 12 mm right hilar node series 4, image 53. No enlarged mediastinal lymph nodes. Small hiatal hernia. Lungs/Pleura: No pulmonary infarct. Small right pleural effusion. Mild heterogeneous pulmonary parenchyma. No features of pulmonary edema. Bowing of the distal trachea and  central bronchi may represent tracheobronchial malacia. Upper Abdomen: Cholecystectomy. Atherosclerosis of the abdominal aorta. Musculoskeletal: There are no acute or suspicious osseous abnormalities. Review of the MIP images confirms the above findings. IMPRESSION: 1. Small acute pulmonary emboli in the right lung. Thromboembolic burden is small. 2. Small right pleural effusion. 3. Cardiomegaly. Coronary artery calcifications. 4. 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 Aortic Atherosclerosis (ICD10-I70.0). Critical Value/emergent results were called by telephone at the time of interpretation on 01/10/2024 at 1:09 am to provider [facility] , who verbally acknowledged these results. Electronically Signed   By: Andrea Gasman M.D.   On: 01/10/2024 01:09   DG Chest Port 1 View Result Date: 01/09/2024 EXAM: 2 VIEW(S) XRAY OF THE CHEST 01/09/2024 11:50:00 PM COMPARISON: AP and lateral chest 03/24/2022. CLINICAL HISTORY: Chest pain and shortening of breath  . FINDINGS: LINES, TUBES AND DEVICES: Multiple overlying telemetry leads. LUNGS AND PLEURA: There is a low inspiration on. exam. Asymmetric coarse  interstitial change in the left base could be due to low lung volumes or infiltrate. The lungs are hypoexpanded, otherwise clear . No substantial pleural effusion. No pneumothorax. HEART AND MEDIASTINUM: There is atherosclerosis and slight tortuosity of the aorta with a stable mediastinum. Mild cardiomegaly with no evidence of CHF. BONES AND SOFT TISSUES: Osteopenia and degenerative changes. IMPRESSION: 1. Asymmetric coarse interstitial change in the left base, possibly due to low lung volumes or infiltrate. Repeat study recommended in full inspiration, preferably with 2 views if possible. 2. Mild cardiomegaly without evidence of congestive heart failure. Electronically signed by: Francis Quam MD 01/09/2024 11:57 PM EST RP Workstation: [id number]      IMPRESSION  AND PLAN:  Assessment and Plan: * Acute pulmonary embolism without acute cor pulmonale (HCC) - The patient has subsequent acute respiratory failure with hypoxia. - The patient will be admitted to a progressive unit bed. - Will continue her on IV heparin . - We will obtain a 2D echo to assess for RV strain. - We will obtain bilateral lower extremity venous Doppler to rule out DVT. - O2 protocol will be followed.  Dyslipidemia - Will continue statin therapy as well as Zetia .  Paroxysmal atrial fibrillation (HCC) - The patient will be on IV heparin   Anxiety and depression - Will continue Zoloft .  GERD without esophagitis - We will continue PPI therapy.   DVT prophylaxis: IV heparin  Advanced Care Planning:  Code Status: The patient is DNR and DNI.  Family Communication:  The plan of care was discussed in details with the patient (and family). I answered all questions. The patient agreed to proceed with the above mentioned plan. Further management will depend upon hospital course. Disposition Plan: Back to previous home environment Consults called: none.  All the records are reviewed and case discussed with ED provider.  Status is: Inpatient  At the time of the admission, it appears that the appropriate admission status for this patient is inpatient.  This is judged to be reasonable and necessary in order to provide the required intensity of service to ensure the patient's safety given the presenting symptoms, physical exam findings and initial radiographic and laboratory data in the context of comorbid conditions.  The patient requires inpatient status due to high intensity of service, high risk of further deterioration and high frequency of surveillance required.  I certify that at the time of admission, it is my clinical judgment that the patient will require inpatient hospital care extending more than 2 midnights.                            Dispo: The patient is from: Home               Anticipated d/c is to: Home              Patient currently is not medically stable to d/c.              Difficult to place patient: No  Madison DELENA Peaches M.D on 01/10/2024 at 2:40 AM  [facility]   From 7 PM-7 AM, contact night-coverage [email or web address]  CC: Primary care physician; Cleatus Arlyss RAMAN, MD     [1]  Allergies Allergen Reactions   Aspirin    Ciprofloxacin     Corticosteroids     Presumed cause of rash/itching.  Do not use prednisone    Nexium [Esomeprazole Magnesium ]    Niacin And Related    Propylene Glycol  Vagifem [Estradiol]    Allegra [Fexofenadine]    Bacitracin-Polymyxin B    Celebrex [Celecoxib]    Doxepin Hcl    Erythromycin    Hydrocortisone Other (See Comments)    After use of 1% HC cream, pt reported burning and feeling scalded in vaginal area.   Levofloxacin    Levsin [Hyoscyamine Sulfate]    Macrobid [Nitrofurantoin Macrocrystal]    Marcaine [Bupivacaine Hcl]    Nsaids    Prevacid [Lansoprazole]     Able to tolerate omeprazole .    Pseudoephedrine    Sulfonamide Derivatives    Tavist [Clemastine]    Cortisone Rash    Any hydrocortisone cream component with severe vaginal burning   "

## 2024-01-10 NOTE — Progress Notes (Signed)
 ANTICOAGULATION CONSULT NOTE  Pharmacy Consult for heparin  infusion Indication: pulmonary embolus  Allergies[1]  Patient Measurements: Weight: 65.2 kg (143 lb 11.2 oz) Heparin  Dosing Weight: 59.4 kg  Vital Signs: Temp: 98 F (36.7 C) (12/24 2322) Temp Source: Oral (12/24 2322) BP: 123/64 (12/25 0000) Pulse Rate: 64 (12/25 0000)  Labs: Recent Labs    01/09/24 2331  HGB 13.2  HCT 38.4  PLT 212  CREATININE 0.82    Estimated Creatinine Clearance: 39 mL/min (by C-G formula based on SCr of 0.82 mg/dL).   Medical History: Past Medical History:  Diagnosis Date   Anxiety    AR (allergic rhinitis)    Atrial fibrillation (HCC)    Basal cell carcinoma of ala nasi 2014   Benign neoplasm stomach body    Cataract    corrected with surgery   Colitis    CTS (carpal tunnel syndrome)    Diverticulitis    Diverticulosis    Esophageal stricture    Frequent headaches    likely migraine   GERD (gastroesophageal reflux disease)    Hepatic steatosis    Hyperlipidemia    Hypertension    IBS (irritable bowel syndrome)    Insomnia    Internal hemorrhoids    Osteopenia     Assessment: Pt is a 89 yo female presenting to ED c/o left sided chest pain, pain with inspiration, found with small acute pulmonary emboli in the right lung. Thromboembolic burden is small.  Goal of Therapy:  Heparin  level 0.3-0.7 units/ml Monitor platelets by anticoagulation protocol: Yes   Plan:  Bolus 4000 units x 1 Start heparin  infusion at 950 units/hr Will check HL in 8 hr after start of infusion CBC daily while on heparin   Rankin CANDIE Dills, PharmD, [facility] 01/10/2024 1:29 AM      [1]  Allergies Allergen Reactions   Aspirin    Ciprofloxacin     Corticosteroids     Presumed cause of rash/itching.  Do not use prednisone    Nexium [Esomeprazole Magnesium ]    Niacin And Related    Propylene Glycol    Vagifem [Estradiol]    Allegra [Fexofenadine]    Bacitracin-Polymyxin B    Celebrex  [Celecoxib]    Doxepin Hcl    Erythromycin    Hydrocortisone Other (See Comments)    After use of 1% HC cream, pt reported burning and feeling scalded in vaginal area.   Levofloxacin    Levsin [Hyoscyamine Sulfate]    Macrobid [Nitrofurantoin Macrocrystal]    Marcaine [Bupivacaine Hcl]    Nsaids    Prevacid [Lansoprazole]     Able to tolerate omeprazole .    Pseudoephedrine    Sulfonamide Derivatives    Tavist [Clemastine]    Cortisone Rash    Any hydrocortisone cream component with severe vaginal burning

## 2024-01-10 NOTE — Plan of Care (Signed)
  Problem: Clinical Measurements: Goal: Ability to maintain clinical measurements within normal limits will improve Outcome: Progressing   Problem: Elimination: Goal: Will not experience complications related to bowel motility Outcome: Progressing   Problem: Pain Managment: Goal: General experience of comfort will improve and/or be controlled Outcome: Progressing   Problem: Safety: Goal: Ability to remain free from injury will improve Outcome: Progressing

## 2024-01-10 NOTE — Assessment & Plan Note (Signed)
-   The patient will be on IV heparin

## 2024-01-15 LAB — CULTURE, BLOOD (ROUTINE X 2)
Culture: NO GROWTH
Culture: NO GROWTH
Special Requests: ADEQUATE

## 2024-01-20 ENCOUNTER — Ambulatory Visit: Payer: Self-pay | Admitting: Family Medicine
